# Patient Record
Sex: Female | Born: 1950 | Race: White | Hispanic: No | Marital: Married | Smoking: Never smoker
Health system: Southern US, Community
[De-identification: ages and names within clinical notes are randomized; demographics above are authoritative.]

## PROBLEM LIST (undated history)

## (undated) DIAGNOSIS — C50919 Malignant neoplasm of unspecified site of unspecified female breast: Secondary | ICD-10-CM

## (undated) DIAGNOSIS — C801 Malignant (primary) neoplasm, unspecified: Secondary | ICD-10-CM

## (undated) DIAGNOSIS — IMO0002 Reserved for concepts with insufficient information to code with codable children: Secondary | ICD-10-CM

## (undated) DIAGNOSIS — R748 Abnormal levels of other serum enzymes: Secondary | ICD-10-CM

## (undated) DIAGNOSIS — E785 Hyperlipidemia, unspecified: Secondary | ICD-10-CM

## (undated) DIAGNOSIS — M779 Enthesopathy, unspecified: Secondary | ICD-10-CM

## (undated) DIAGNOSIS — F419 Anxiety disorder, unspecified: Secondary | ICD-10-CM

## (undated) DIAGNOSIS — Z8719 Personal history of other diseases of the digestive system: Secondary | ICD-10-CM

## (undated) DIAGNOSIS — I10 Essential (primary) hypertension: Secondary | ICD-10-CM

## (undated) DIAGNOSIS — I447 Left bundle-branch block, unspecified: Secondary | ICD-10-CM

## (undated) DIAGNOSIS — Z95810 Presence of automatic (implantable) cardiac defibrillator: Secondary | ICD-10-CM

## (undated) DIAGNOSIS — K5790 Diverticulosis of intestine, part unspecified, without perforation or abscess without bleeding: Secondary | ICD-10-CM

## (undated) DIAGNOSIS — R7303 Prediabetes: Secondary | ICD-10-CM

## (undated) DIAGNOSIS — M719 Bursopathy, unspecified: Secondary | ICD-10-CM

## (undated) DIAGNOSIS — D649 Anemia, unspecified: Secondary | ICD-10-CM

## (undated) DIAGNOSIS — I509 Heart failure, unspecified: Secondary | ICD-10-CM

## (undated) DIAGNOSIS — I428 Other cardiomyopathies: Secondary | ICD-10-CM

## (undated) HISTORY — DX: Prediabetes: R73.03

## (undated) HISTORY — DX: Left bundle-branch block, unspecified: I44.7

## (undated) HISTORY — PX: BREAST BIOPSY: SHX20

## (undated) HISTORY — PX: TONSILLECTOMY AND ADENOIDECTOMY: SUR1326

## (undated) HISTORY — PX: CHOLECYSTECTOMY: SHX55

## (undated) HISTORY — PX: OTHER SURGICAL HISTORY: SHX169

## (undated) HISTORY — DX: Enthesopathy, unspecified: M77.9

## (undated) HISTORY — DX: Other cardiomyopathies: I42.8

## (undated) HISTORY — DX: Presence of automatic (implantable) cardiac defibrillator: Z95.810

## (undated) HISTORY — DX: Hyperlipidemia, unspecified: E78.5

## (undated) HISTORY — PX: RHINOPLASTY: SUR1284

## (undated) HISTORY — PX: TUBAL LIGATION: SHX77

## (undated) HISTORY — PX: REDUCTION MAMMAPLASTY: SUR839

## (undated) HISTORY — PX: DILATION AND CURETTAGE OF UTERUS: SHX78

## (undated) HISTORY — PX: COLONOSCOPY: SHX174

## (undated) HISTORY — PX: CARDIAC CATHETERIZATION: SHX172

## (undated) HISTORY — DX: Anemia, unspecified: D64.9

## (undated) HISTORY — PX: ANKLE FRACTURE SURGERY: SHX122

## (undated) HISTORY — DX: Diverticulosis of intestine, part unspecified, without perforation or abscess without bleeding: K57.90

## (undated) HISTORY — DX: Malignant (primary) neoplasm, unspecified: C80.1

## (undated) HISTORY — PX: MASTECTOMY: SHX3

## (undated) HISTORY — DX: Essential (primary) hypertension: I10

## (undated) HISTORY — DX: Bursopathy, unspecified: M71.9

## (undated) HISTORY — DX: Malignant neoplasm of unspecified site of unspecified female breast: C50.919

---

## 2004-06-23 ENCOUNTER — Other Ambulatory Visit: Admission: RE | Admit: 2004-06-23 | Discharge: 2004-06-23 | Payer: Self-pay | Admitting: Obstetrics and Gynecology

## 2005-04-16 DIAGNOSIS — R748 Abnormal levels of other serum enzymes: Secondary | ICD-10-CM

## 2005-04-16 HISTORY — DX: Abnormal levels of other serum enzymes: R74.8

## 2013-10-14 ENCOUNTER — Other Ambulatory Visit: Payer: Self-pay | Admitting: Obstetrics and Gynecology

## 2013-10-14 DIAGNOSIS — R928 Other abnormal and inconclusive findings on diagnostic imaging of breast: Secondary | ICD-10-CM

## 2013-10-19 ENCOUNTER — Ambulatory Visit
Admission: RE | Admit: 2013-10-19 | Discharge: 2013-10-19 | Disposition: A | Discharge status: Home / Self Care | Payer: BC Managed Care – PPO | Source: Ambulatory Visit | Attending: Obstetrics and Gynecology | Admitting: Obstetrics and Gynecology

## 2013-10-19 ENCOUNTER — Other Ambulatory Visit: Payer: Self-pay | Admitting: Obstetrics and Gynecology

## 2013-10-19 DIAGNOSIS — R928 Other abnormal and inconclusive findings on diagnostic imaging of breast: Secondary | ICD-10-CM

## 2013-10-26 ENCOUNTER — Other Ambulatory Visit: Payer: Self-pay

## 2013-10-28 ENCOUNTER — Ambulatory Visit
Admission: RE | Admit: 2013-10-28 | Discharge: 2013-10-28 | Disposition: A | Discharge status: Home / Self Care | Payer: BC Managed Care – PPO | Source: Ambulatory Visit | Attending: Obstetrics and Gynecology | Admitting: Obstetrics and Gynecology

## 2013-10-28 ENCOUNTER — Encounter (INDEPENDENT_AMBULATORY_CARE_PROVIDER_SITE_OTHER): Payer: Self-pay

## 2013-10-28 DIAGNOSIS — R928 Other abnormal and inconclusive findings on diagnostic imaging of breast: Secondary | ICD-10-CM

## 2013-10-30 ENCOUNTER — Other Ambulatory Visit: Payer: Self-pay | Admitting: Obstetrics and Gynecology

## 2013-10-30 DIAGNOSIS — C50911 Malignant neoplasm of unspecified site of right female breast: Secondary | ICD-10-CM

## 2013-11-10 ENCOUNTER — Ambulatory Visit
Admission: RE | Admit: 2013-11-10 | Discharge: 2013-11-10 | Disposition: A | Discharge status: Home / Self Care | Payer: BC Managed Care – PPO | Source: Ambulatory Visit | Attending: Obstetrics and Gynecology | Admitting: Obstetrics and Gynecology

## 2013-11-10 DIAGNOSIS — C50911 Malignant neoplasm of unspecified site of right female breast: Secondary | ICD-10-CM

## 2013-11-10 MED ORDER — GADOBENATE DIMEGLUMINE 529 MG/ML IV SOLN
15.0000 mL | Freq: Once | INTRAVENOUS | Status: AC | PRN
  Administered 2013-11-10: 15 mL via INTRAVENOUS

## 2013-11-11 ENCOUNTER — Other Ambulatory Visit: Payer: Self-pay | Admitting: Obstetrics and Gynecology

## 2013-11-11 DIAGNOSIS — R928 Other abnormal and inconclusive findings on diagnostic imaging of breast: Secondary | ICD-10-CM

## 2013-11-12 ENCOUNTER — Encounter (INDEPENDENT_AMBULATORY_CARE_PROVIDER_SITE_OTHER): Payer: Self-pay | Admitting: General Surgery

## 2013-11-12 ENCOUNTER — Ambulatory Visit (INDEPENDENT_AMBULATORY_CARE_PROVIDER_SITE_OTHER): Payer: BC Managed Care – PPO | Admitting: General Surgery

## 2013-11-12 VITALS — BP 160/60 | HR 92 | Temp 98.1°F | Resp 20 | Ht 66.0 in | Wt 190.0 lb

## 2013-11-12 DIAGNOSIS — C50919 Malignant neoplasm of unspecified site of unspecified female breast: Secondary | ICD-10-CM

## 2013-11-12 DIAGNOSIS — C50411 Malignant neoplasm of upper-outer quadrant of right female breast: Secondary | ICD-10-CM | POA: Insufficient documentation

## 2013-11-12 DIAGNOSIS — C50911 Malignant neoplasm of unspecified site of right female breast: Secondary | ICD-10-CM

## 2013-11-12 NOTE — Progress Notes (Signed)
Chief Complaint: New diagnosis of breast cancer  History:    Loretta Thomas is a 63 y.o. postmenopausal female referred by Dr. 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 She recently presented for a screening mamogram revealing A new area of microcalcifications in the central right breast..  Subsequent imaging included diagnostic mamogram showing A 6 x 6 x 12 mm group of heterogeneous calcifications in the middle third outer right breast with suspicious for DCIS.   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 Subsequent bilateral breast MRI was performed. This has revealed within the central retroareolar region of the right breast a small hematoma post biopsy but also significant additional nonmacerated enhancement in the retroareolar region extending to the nipple with the area of enhancement measuring 4.3 x 2.5 x 3.0 cm. She is seen now in [facility] office for initial treatment planning.  She has experienced No breast symptoms, specifically lump or pain or ongoing nippled discharge..  She does not have a personal history of any previous breast problems.  Findings at that time were the following:  Tumor size: 1.2 cm  Tumor grade: 3  Estrogen Receptor: positive Progesterone Receptor: positive  Her-2 neu: negative  Lymph node status: negative    Past Medical History  Diagnosis Date  . Anemia   . Hyperlipidemia   . Hypertension   . Cancer     right breast    Past Surgical History  Procedure Laterality Date  . Breast biopsy      right breast  . Tonsillectomy and adenoidectomy    . Rhinoplasty    . Ankle fracture surgery    . Cholecystectomy    . Cardiac catheterization      x3  . Colonoscopy      x4  . Tubal ligation    . Dilation and curettage of uterus      Current Outpatient Prescriptions  Medication Sig  Dispense Refill  . ALPRAZolam (XANAX XR) 0.5 MG 24 hr tablet Take 0.5 mg by mouth daily.      Marland Kitchen aspirin 81 MG tablet Take 81 mg by mouth daily.      . carvedilol (COREG) 6.25 MG tablet Take 6.25 mg by mouth 2 (two) times daily with a meal.      . losartan (COZAAR) 50 MG tablet Take 50 mg by mouth daily.      . simvastatin (ZOCOR) 20 MG tablet Take 20 mg by mouth daily.      . vitamin B-12 (CYANOCOBALAMIN) 50 MCG tablet Take 50 mcg by mouth daily.      Marland Kitchen zolpidem (AMBIEN) 10 MG tablet Take 10 mg by mouth at bedtime as needed for sleep.       No current facility-administered medications for this visit.    Family History  Problem Relation Age of Onset  . Cancer Sister     skin and colon  . Cancer Brother     esophageal  . Cancer Maternal Aunt     breast    History   Social History  . Marital Status: Married    Spouse Name: N/A    Number of Children: N/A  . Years of Education: N/A   Social History Main Topics  . Smoking status: Never Smoker   . Smokeless tobacco: None  . Alcohol Use: No  . Drug Use: No  . Sexual Activity:  None   Other Topics Concern  . None   Social History Narrative  . None     Review of Systems Respiratory: negative Cardiovascular: negative, she has previous history of cardiac catheter x3 after episode of hypertension and concern of her mitral prolapse but reportedly all negative Gastrointestinal: positive for constipation, diarrhea and related to IBS Hematologic/lymphatic: negative, gives a history of previous pernicious anemia and low platelet count that has resolved     Objective:  BP 160/60  Pulse 92  Temp(Src) 98.1 F (36.7 C) (Oral)  Resp 20  Ht 5' 6" (1.676 m)  Wt 190 lb (86.183 kg)  BMI 30.68 kg/m2  General: Alert, well-developed Caucasian female, in no distress Skin: Warm and dry without rash or infection. HEENT: No palpable masses or thyromegaly. Sclera nonicteric. Pupils equal round and reactive. Oropharynx clear. Breasts: Some  slight bruising and tenderness lateral right breast. No definite masses or skin changes or nipple retraction. Lymph nodes: No cervical, supraclavicular, or inguinal nodes palpable. Lungs: Breath sounds clear and equal without increased work of breathing Cardiovascular: Regular rate and rhythm without murmur. No JVD or edema. Peripheral pulses intact. Abdomen: Nondistended. Soft and nontender. No masses palpable. No organomegaly. No palpable hernias. Extremities: No edema or joint swelling or deformity. No chronic venous stasis changes. Neurologic: Alert and fully oriented. Gait normal.   Laboratory data:  CBC:  No results found for this basename: WBC, RBC, HGB, HCT, PLT  ]  CMG Labs:  No results found for this basename: GLUF, NA, K, CL, CO2, BUN, CREATININE, CALCIUM, PROT, ALB, BILITOT, BILIDIR, ALKPHOS, AST, ALT     Assessment  63 y.o. female with a new diagnosis of cancer of the the right breast central and outer breast.  Clinical IA, estrogen receptor positive, progesterone receptor positive and Her2/Neu protein/oncogene negative. MRI has shown potentially more locally extensive disease extending up to behind and involving the nipple.I discussed with the patient and family members present today initial surgical treatment options. We discussed options of breast conservation with lumpectomy or total mastectomy and sentinal lymph node biopsy/dissection. Options for reconstruction were discussed. She would like to pursue breast conservation if this is feasible. This would depend on whether the MRI findings are secondary to tumor or not. We will therefore schedule her for a second likely MRI guided biopsy of the more anterior area of enhancement. I discussed with the patient and her family that if this biopsy were positive then I think she would be best served with a total mastectomy. We discussed reconstruction and she would be interested in this. If this biopsy were negative and I believe she  would be a candidate for breast conservation. We discussed the evaluation of the axilla with sentinel lymph node biopsy. We discussed treatment of breast cancer in general with discussion of radiation and hormonal therapy and chemotherapy.  Plan Proceed with MRI guided biopsy of the anterior area of enhancement of the right breast. Further surgical treatment planning will be pending this biopsy. I will call the patient with the results and then we'll get her back in the office for further treatment planning.  Edward Jolly MD, FACS  11/12/2013, 10:43 AM

## 2013-11-16 ENCOUNTER — Ambulatory Visit
Admission: RE | Admit: 2013-11-16 | Discharge: 2013-11-16 | Disposition: A | Discharge status: Home / Self Care | Payer: BC Managed Care – PPO | Source: Ambulatory Visit | Attending: Obstetrics and Gynecology | Admitting: Obstetrics and Gynecology

## 2013-11-16 ENCOUNTER — Other Ambulatory Visit: Payer: Self-pay | Admitting: Diagnostic Radiology

## 2013-11-16 DIAGNOSIS — R928 Other abnormal and inconclusive findings on diagnostic imaging of breast: Secondary | ICD-10-CM

## 2013-11-16 MED ORDER — GADOBENATE DIMEGLUMINE 529 MG/ML IV SOLN
15.0000 mL | Freq: Once | INTRAVENOUS | Status: DC | PRN
Start: 2013-11-16 — End: 2013-11-17

## 2013-11-19 ENCOUNTER — Telehealth (INDEPENDENT_AMBULATORY_CARE_PROVIDER_SITE_OTHER): Payer: Self-pay

## 2013-11-19 NOTE — Telephone Encounter (Signed)
Called and left message for patient to call our office RE:  Appointment for tomorrow 11/20/13 @ 4:30pm to see Dr. Excell Seltzer.

## 2013-11-20 ENCOUNTER — Telehealth (INDEPENDENT_AMBULATORY_CARE_PROVIDER_SITE_OTHER): Payer: Self-pay

## 2013-11-20 ENCOUNTER — Encounter (INDEPENDENT_AMBULATORY_CARE_PROVIDER_SITE_OTHER): Payer: Self-pay | Admitting: General Surgery

## 2013-11-20 ENCOUNTER — Ambulatory Visit (INDEPENDENT_AMBULATORY_CARE_PROVIDER_SITE_OTHER): Payer: BC Managed Care – PPO | Admitting: General Surgery

## 2013-11-20 VITALS — BP 134/78 | HR 81 | Temp 97.0°F | Ht 67.0 in | Wt 187.0 lb

## 2013-11-20 DIAGNOSIS — C50919 Malignant neoplasm of unspecified site of unspecified female breast: Secondary | ICD-10-CM

## 2013-11-20 DIAGNOSIS — C50911 Malignant neoplasm of unspecified site of right female breast: Secondary | ICD-10-CM

## 2013-11-20 NOTE — Telephone Encounter (Signed)
Message copied by Ivor Costa on Fri Nov 20, 2013 10:33 AM ------      Message from: Crisoforo Oxford      Created: Thu Nov 19, 2013 10:24 AM      Contact: [phone]       Pt called you back ,pls call ty TT ------

## 2013-11-20 NOTE — Telephone Encounter (Signed)
Spoke to patient this morning regarding appointment.  Patient states she returned my call yesterday and was given the message about appointment today with Dr. Excell Seltzer.

## 2013-11-20 NOTE — Patient Instructions (Signed)
We will arrange visits with genetic counseling, physical therapy and plastic surgery then plan to proceed with right total mastectomy with sentinel lymph node biopsy and immediate reconstruction as discussed.

## 2013-11-20 NOTE — Progress Notes (Signed)
Chief Complaint: New diagnosis of breast cancer  History:    Loretta Thomas is a 63 y.o. postmenopausal female referred by Dr. 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 She recently presented for a screening mamogram revealing A new area of microcalcifications in the central right breast..  Subsequent imaging included diagnostic mamogram showing A 6 x 6 x 12 mm group of heterogeneous calcifications in the middle third outer right breast with suspicious for DCIS.   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 Subsequent bilateral breast MRI was performed. This has revealed within the central retroareolar region of the right breast a small hematoma post biopsy but also significant additional nonmacerated enhancement in the retroareolar region extending to the nipple with the area of enhancement measuring 4.3 x 2.5 x 3.0 cm. She is now seen back in the office following MR guided biopsy of the more extensive retroareolar enhancement seen on MRI.  This has unfortunately revealed DCIS. The patient is brought back to the office today to discuss the diagnosis and treatment planning. Findings at that time were the following:  Tumor size: 4.5 cm  Tumor grade: 3  Estrogen Receptor: positive Progesterone Receptor: positive  Her-2 neu: negative  Lymph node status: negative    Past Medical History  Diagnosis Date  . Anemia   . Hyperlipidemia   . Hypertension   . Cancer     right breast    Past Surgical History  Procedure Laterality Date  . Breast biopsy      right breast  . Tonsillectomy and adenoidectomy    . Rhinoplasty    . Ankle fracture surgery    . Cholecystectomy    . Cardiac catheterization      x3  . Colonoscopy      x4  . Tubal ligation    . Dilation and curettage of uterus      Current Outpatient  Prescriptions  Medication Sig Dispense Refill  . ALPRAZolam (XANAX XR) 0.5 MG 24 hr tablet Take 0.5 mg by mouth daily.      Marland Kitchen aspirin 81 MG tablet Take 81 mg by mouth daily.      . carvedilol (COREG) 6.25 MG tablet Take 6.25 mg by mouth 2 (two) times daily with a meal.      . losartan (COZAAR) 50 MG tablet Take 50 mg by mouth daily.      . simvastatin (ZOCOR) 20 MG tablet Take 20 mg by mouth daily.      . vitamin B-12 (CYANOCOBALAMIN) 50 MCG tablet Take 50 mcg by mouth daily.      Marland Kitchen zolpidem (AMBIEN) 10 MG tablet Take 10 mg by mouth at bedtime as needed for sleep.       No current facility-administered medications for this visit.    Family History  Problem Relation Age of Onset  . Cancer Sister     skin and colon  . Cancer Brother     esophageal  . Cancer Maternal Aunt     breast    History   Social History  . Marital Status: Married    Spouse Name: N/A    Number of Children: N/A  . Years of Education: N/A   Social History Main Topics  . Smoking status: Never Smoker   . Smokeless tobacco: None  . Alcohol Use: No  . Drug Use: No  .  Sexual Activity: None   Other Topics Concern  . None   Social History Narrative  . None     Review of Systems Respiratory: negative Cardiovascular: negative, she has previous history of cardiac catheter x3 after episode of hypertension and concern of her mitral prolapse but reportedly all negative Gastrointestinal: positive for constipation, diarrhea and related to IBS Hematologic/lymphatic: negative, gives a history of previous pernicious anemia and low platelet count that has resolved     Objective:  BP 134/78  Pulse 81  Temp(Src) 97 F (36.1 C)  Ht '5\' 7"'  (1.702 m)  Wt 187 lb (84.823 kg)  BMI 29.28 kg/m2  General: Alert, well-developed Caucasian female, in no distress Skin: Warm and dry without rash or infection. HEENT: No palpable masses or thyromegaly. Sclera nonicteric. Pupils equal round and reactive. Oropharynx  clear. Breasts: Some slight bruising and tenderness lateral right breast. No definite masses or skin changes or nipple retraction. Lymph nodes: No cervical, supraclavicular, or inguinal nodes palpable. Lungs: Breath sounds clear and equal without increased work of breathing Cardiovascular: Regular rate and rhythm without murmur. No JVD or edema. Peripheral pulses intact. Abdomen: Nondistended. Soft and nontender. No masses palpable. No organomegaly. No palpable hernias. Extremities: No edema or joint swelling or deformity. No chronic venous stasis changes. Neurologic: Alert and fully oriented. Gait normal.   Laboratory data:  CBC:  No results found for this basename: WBC,  RBC,  HGB,  HCT,  PLT  ]  CMG Labs:  No results found for this basename: GLUF,  NA,  K,  CL,  CO2,  BUN,  CREATININE,  CALCIUM,  PROT,  ALB,  BILITOT,  BILIDIR,  ALKPHOS,  AST,  ALT     Assessment  63 y.o. female with a new diagnosis of cancer of the the right breast central and outer breast.  Clinical IA, estrogen receptor positive, progesterone receptor positive and Her2/Neu protein/oncogene negative. MRI has shown  more locally extensive disease extending up to behind and involving the nipple Measuring up to 4.5 cm in greatest extent..As we had previously discussed at her first visit this finding of locally extensive disease and involvement of the nipple I believe she would be best served with total mastectomy. This was discussed with the patient and her family today in great detail. She was upset with this news but accepting. I believe she would be a candidate for immediate reconstruction and she is interested in this. We will make a referral to plastic surgery for her. The patient also is asking about genetic counseling. She does have some history of breast cancer and ovarian cancer in her mother and aunt and I'm going to refer her for genetic counseling. We will have her see physical therapy regarding lymphedema  prevention. I am recommending right total mastectomy with axillary sentinel lymph node biopsy and possible axillary dissection with likely immediate reconstruction. We discussed the indications for the procedure and risks of anesthetic complications, bleeding, infection, wound healing problems and lymphedema. All her questions were answered. She is in agreement with the plan. Plan Right total mastectomy with axillary sentinel lymph node biopsy and possible axillary dissection. She is interested in immediate reconstruction and she is being referred to plastic surgery. She will see genetic counselor and physical therapy preop.  Edward Jolly MD, FACS  11/20/2013, 6:34 PM

## 2013-11-20 NOTE — Telephone Encounter (Signed)
Spoke to patient this morning to confirm she's aware of office appointment today with Dr. Excell Seltzer

## 2013-11-23 ENCOUNTER — Telehealth (INDEPENDENT_AMBULATORY_CARE_PROVIDER_SITE_OTHER): Payer: Self-pay | Admitting: *Deleted

## 2013-11-23 NOTE — Telephone Encounter (Signed)
Pt returned my call and she was made aware of her appt with Dr. Harlow Mares.  She said she may have to cancel, so I provided her with the phone number to Dr. Harlow Mares in case she needed to call.  Anderson Malta

## 2013-11-23 NOTE — Telephone Encounter (Signed)
LMOM for pt to return my call regarding a referral for her to see the plastic surgeon, Dr. Harlow Mares.  Please advise pt it has been scheduled for this Friday, 11-27-13 arriving at 11:15.  Please let pt know their office is in the same building as [place], they are on the 2nd floor [address].  Thanks!  Anderson Malta

## 2013-11-23 NOTE — Addendum Note (Signed)
Addended by: Ivor Costa on: 11/23/2013 01:33 PM   Modules accepted: Orders

## 2013-11-25 ENCOUNTER — Encounter (INDEPENDENT_AMBULATORY_CARE_PROVIDER_SITE_OTHER): Payer: BC Managed Care – PPO | Admitting: General Surgery

## 2013-12-04 ENCOUNTER — Telehealth (INDEPENDENT_AMBULATORY_CARE_PROVIDER_SITE_OTHER): Payer: Self-pay

## 2013-12-04 ENCOUNTER — Telehealth: Payer: Self-pay | Admitting: *Deleted

## 2013-12-04 ENCOUNTER — Ambulatory Visit: Payer: BC Managed Care – PPO | Admitting: Physical Therapy

## 2013-12-04 NOTE — Telephone Encounter (Signed)
Left message for pt to return my call so I can schedule a genetic appt.  

## 2013-12-04 NOTE — Telephone Encounter (Signed)
Imaging reports faxed to Dr. Harlow Mares office @ [phone]

## 2013-12-07 ENCOUNTER — Ambulatory Visit: Payer: BC Managed Care – PPO | Attending: Physical Therapy | Admitting: Physical Therapy

## 2013-12-07 DIAGNOSIS — C50919 Malignant neoplasm of unspecified site of unspecified female breast: Secondary | ICD-10-CM | POA: Diagnosis not present

## 2013-12-07 DIAGNOSIS — R293 Abnormal posture: Secondary | ICD-10-CM | POA: Insufficient documentation

## 2013-12-07 DIAGNOSIS — Z8781 Personal history of (healed) traumatic fracture: Secondary | ICD-10-CM | POA: Insufficient documentation

## 2013-12-07 DIAGNOSIS — I10 Essential (primary) hypertension: Secondary | ICD-10-CM | POA: Insufficient documentation

## 2013-12-07 DIAGNOSIS — IMO0001 Reserved for inherently not codable concepts without codable children: Secondary | ICD-10-CM | POA: Insufficient documentation

## 2013-12-14 ENCOUNTER — Telehealth: Payer: Self-pay | Admitting: *Deleted

## 2013-12-14 NOTE — Telephone Encounter (Signed)
Pt returned my call and she was confused to why I was calling.  Explained to her of the referrals sent over for Med Onc and genetics.  She went ahead and agreed to scheduled the med onc appt, will speak with her later about the genetic appt.  Faxed new pt letter, welcoming packet & intake form to pts work.  Emailed [employer] [employer] [place] at [employer] to make them aware.

## 2013-12-14 NOTE — Telephone Encounter (Signed)
Spoke with pt again and confirmed 12/16/13 genetic appt w/ her. Emailed Barnetta Chapel Fine to make her aware of the appt add on.  Emailed Alyse Low and [place] at [employer] to make them aware.

## 2013-12-14 NOTE — Telephone Encounter (Signed)
Pt left me a message, called her back and left a message for return my call so I can schedule a med onc and genetic appt w/ her.

## 2013-12-16 ENCOUNTER — Encounter: Payer: Self-pay | Admitting: Hematology and Oncology

## 2013-12-16 ENCOUNTER — Ambulatory Visit: Payer: BC Managed Care – PPO | Admitting: Genetic Counselor

## 2013-12-16 ENCOUNTER — Ambulatory Visit: Payer: BC Managed Care – PPO

## 2013-12-16 ENCOUNTER — Other Ambulatory Visit (HOSPITAL_BASED_OUTPATIENT_CLINIC_OR_DEPARTMENT_OTHER): Payer: BC Managed Care – PPO

## 2013-12-16 ENCOUNTER — Ambulatory Visit (HOSPITAL_BASED_OUTPATIENT_CLINIC_OR_DEPARTMENT_OTHER): Payer: BC Managed Care – PPO | Admitting: Hematology and Oncology

## 2013-12-16 VITALS — BP 147/84 | HR 79 | Temp 97.8°F | Resp 22 | Ht 67.0 in | Wt 189.0 lb

## 2013-12-16 DIAGNOSIS — C50919 Malignant neoplasm of unspecified site of unspecified female breast: Secondary | ICD-10-CM

## 2013-12-16 DIAGNOSIS — Z800 Family history of malignant neoplasm of digestive organs: Secondary | ICD-10-CM | POA: Insufficient documentation

## 2013-12-16 DIAGNOSIS — Z803 Family history of malignant neoplasm of breast: Secondary | ICD-10-CM | POA: Insufficient documentation

## 2013-12-16 DIAGNOSIS — C50911 Malignant neoplasm of unspecified site of right female breast: Secondary | ICD-10-CM

## 2013-12-16 DIAGNOSIS — Z170 Estrogen receptor positive status [ER+]: Secondary | ICD-10-CM

## 2013-12-16 DIAGNOSIS — N390 Urinary tract infection, site not specified: Secondary | ICD-10-CM

## 2013-12-16 DIAGNOSIS — Z8041 Family history of malignant neoplasm of ovary: Secondary | ICD-10-CM | POA: Insufficient documentation

## 2013-12-16 LAB — URINALYSIS, MICROSCOPIC - CHCC
Bilirubin (Urine): NEGATIVE
Blood: NEGATIVE
Glucose: NEGATIVE mg/dL
Ketones: NEGATIVE mg/dL
Nitrite: NEGATIVE
Protein: NEGATIVE mg/dL
RBC / HPF: NEGATIVE (ref 0–2)
Specific Gravity, Urine: 1.01 (ref 1.003–1.035)
Urobilinogen, UR: 0.2 mg/dL (ref 0.2–1)
pH: 7.5 (ref 4.6–8.0)

## 2013-12-16 NOTE — Addendum Note (Signed)
Addended by: Prentiss Bells on: 12/16/2013 01:45 PM   Modules accepted: Orders

## 2013-12-16 NOTE — Progress Notes (Signed)
[facility] CONSULT NOTE  Patient Care Team: Olga Millers, MD as PCP - General (Obstetrics and Gynecology)  CHIEF COMPLAINTS/PURPOSE OF CONSULTATION:  Newly diagnosed breast cancer  HISTORY OF PRESENTING ILLNESS:  Loretta Thomas 63 y.o. female is here because of recent diagnosis of right breast cancer. She had a screening mammogram on 10/19/2013 that revealed in the outer quadrant of the right breast 1.2 cm group of heterogeneous calcifications this was biopsied on 10/28/2013 revealed DCIS with comedonecrosis but an additional focus of invasive ductal carcinoma was also seen this was ER/PR positive HER-2 negative. She underwent bilateral MRI of the breasts that revealed apart from the hematoma there was a non-masslike enhancement measuring 4.3 cm. Extending to the retroareolar region. The retroareolar region biopsy was done 11/16/2013 which came back as DCIS. She saw Dr. Excell Seltzer who recommended mastectomy and she is here today to discuss the treatment plan.  I reviewed her records extensively and collaborated the history with the patient.  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  She was never exposed to fertility medications or hormone  replacement therapy.  She has  family history of Breast/GYN/GI cancer  MEDICAL HISTORY:  Past Medical History  Diagnosis Date  . Anemia   . Hyperlipidemia   . Hypertension   . Cancer     right breast    SURGICAL HISTORY: Past Surgical History  Procedure Laterality Date  . Breast biopsy      right breast  . Tonsillectomy and adenoidectomy    . Rhinoplasty    . Ankle fracture surgery    . Cholecystectomy    . Cardiac catheterization      x3  . Colonoscopy      x4  . Tubal ligation    . Dilation and curettage of uterus      SOCIAL HISTORY: History   Social History  . Marital Status: Married    Spouse Name: N/A    Number of Children: N/A  . Years of Education: N/A   Occupational History  . Not on file.   Social History Main Topics  . Smoking status: Never Smoker   . Smokeless tobacco: Not on file  . Alcohol Use: No  . Drug Use: No  . Sexual Activity: Not on file   Other Topics Concern  . Not on file   Social History Narrative  . No narrative on file    FAMILY HISTORY: Family History  Problem Relation Age of Onset  . Cancer Sister     skin and colon  . Cancer Brother     esophageal  . Cancer Maternal Aunt     breast    ALLERGIES:  is allergic to codeine; demerol; hydrocodone; and lortab.  MEDICATIONS:  Current Outpatient Prescriptions  Medication Sig Dispense Refill  . ALPRAZolam (XANAX XR) 0.5 MG 24 hr tablet Take 0.5 mg by mouth daily.      Marland Kitchen aspirin 81 MG tablet Take 81 mg by mouth daily.      . carvedilol (COREG) 6.25 MG tablet Take 6.25 mg by mouth 2 (two) times daily with a meal.      . losartan (COZAAR) 50 MG tablet Take 50 mg by mouth daily.      . simvastatin (ZOCOR) 20 MG tablet Take 20 mg by mouth daily.      . vitamin B-12 (CYANOCOBALAMIN) 50 MCG tablet Take 50 mcg by mouth daily.      Marland Kitchen zolpidem (AMBIEN) 10 MG tablet Take 10 mg by mouth at bedtime as needed for sleep.       No current facility-administered medications for this  visit.    REVIEW OF SYSTEMS:   Constitutional: Denies fevers, chills or abnormal night sweats Eyes: Denies blurriness of vision, double vision or watery eyes Ears, nose, mouth, throat, and face: Denies mucositis or sore throat Respiratory: Denies cough, dyspnea or wheezes Cardiovascular: Denies palpitation, chest discomfort or lower extremity swelling Gastrointestinal:  Denies nausea, heartburn or change in bowel habits Skin: Denies abnormal skin rashes Lymphatics: Denies new lymphadenopathy or easy bruising Neurological:Denies numbness, tingling or new weaknesses Behavioral/Psych: Mood is stable, no new changes  Breast:  Denies any palpable lumps or discharge All other systems were reviewed with the patient and are negative.  PHYSICAL EXAMINATION: ECOG PERFORMANCE STATUS: 0 - Asymptomatic  There were no vitals filed for this visit. There were no vitals filed for this visit.  GENERAL:alert, no distress and comfortable SKIN: skin color, texture, turgor are normal, no rashes or significant lesions EYES: normal, conjunctiva are pink and non-injected, sclera clear OROPHARYNX:no exudate, no erythema and lips, buccal mucosa, and tongue normal  NECK: supple, thyroid normal size, non-tender, without nodularity LYMPH:  no palpable lymphadenopathy in the cervical, axillary or inguinal LUNGS: clear to auscultation and percussion with normal breathing effort HEART: regular rate & rhythm and no murmurs and no lower extremity edema ABDOMEN:abdomen soft, non-tender and normal bowel sounds Musculoskeletal:no cyanosis of digits and no clubbing  PSYCH: alert & oriented x 3 with fluent speech NEURO: no focal motor/sensory deficits BREAST: No palpable nodules in breast. No palpable axillary or supraclavicular lymphadenopathy  LABORATORY DATA:  I have reviewed the data as listed No results found for this basename: WBC,  HGB,  HCT,  MCV,  PLT   No results found for this basename: NA,  K,  CL,  CO2     RADIOGRAPHIC STUDIES: I have personally reviewed the radiological reports and agreed with the findings in the report.  ASSESSMENT AND PLAN:  Cancer of right breast 1. 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 MRI of the breasts was reviewed with the patient which showed diffuse extension up to the area large area measuring 4.3 cm. This was biopsied 11/16/2013 came back as DCIS. Patient is being evaluated for mastectomy and sentinel lymph node study. Clinical stage based on mammogram is T1c N0 M0 stage IA  2. Discussed with her the details of pathology including the clinical staging the type of breast cancer, the significance of ER PR and HER-2/neu receptors and the implications for treatment decisions. After reviewing the pathology in detail, we proceeded to discuss the different treatment options between surgery, radiation, chemotherapy,  antiestrogen therapies. After surgery I would like to see her and consider sending her tissue for Oncotype DX testing to determine if she would benefit from adjuvant systemic chemotherapy.    return to clinic after surgery  All questions were answered. The patient knows to call the clinic with any problems, questions or concerns. I spent 55 minutes counseling the patient face to face. The total time spent in the appointment was 60 minutes and more than 50% was on counseling.     Rulon Eisenmenger, MD 12/16/2013 11:34 AM

## 2013-12-16 NOTE — Progress Notes (Signed)
Checked in patient with no financial issues prior to seeing the dr. She said she probably is close to meeting her deductible. I gave her Lenise's card and if any issues to call her. She has appt card and has her [facility] packet already.

## 2013-12-16 NOTE — Progress Notes (Signed)
HISTORY OF PRESENT ILLNESS: Dr. Ouida Sills requested a cancer genetics consultation for Loretta Thomas, a 63 y.o. female, due to a personal and family history of cancer.  Loretta Thomas presents to clinic today to discuss the possibility of a hereditary predisposition to cancer, genetic testing, and to further clarify her future cancer risks, as well as potential cancer risk for family members. Loretta Thomas was diagnosed with right breast cancer at the age of 72. She is currently planning her treatment with her oncologist and surgeon and would like to use genetic test results to help make surgical decisions.    Past Medical History  Diagnosis Date   Anemia    Hyperlipidemia    Hypertension    Cancer     right breast   Diabetes mellitus without complication     pre-diabetes    Past Surgical History  Procedure Laterality Date   Breast biopsy      right breast   Tonsillectomy and adenoidectomy     Rhinoplasty     Ankle fracture surgery     Cholecystectomy     Cardiac catheterization      x3   Colonoscopy      x4   Tubal ligation     Dilation and curettage of uterus      History   Social History   Marital Status: Married    Spouse Name: N/A    Number of Children: N/A   Years of Education: N/A   Social History Main Topics   Smoking status: Never Smoker    Smokeless tobacco: Never Used   Alcohol Use: No   Drug Use: No   Sexual Activity: Not on file   Other Topics Concern   Not on file   Social History Narrative   No narrative on file     FAMILY HISTORY:  During the visit, a 4-generation pedigree was obtained. Significant diagnoses include the following:  Family History  Problem Relation Age of Onset   Cancer Sister     skin and colon x2   Cancer Brother     esophageal   Cancer Maternal Aunt 42    breast   Cancer Maternal Grandmother 59    ovarian   Cancer Cousin 27    mat 1st cousin with breast    Loretta Thomas's ancestry is of  Caucasian descent. There is no known Jewish ancestry or consanguinity.  GENETIC COUNSELING ASSESSMENT: Loretta Thomas is a 63 y.o. female with a personal and family history of cancer suggestive of a hereditary predisposition to cancer. We, therefore, discussed and recommended the following at today's visit.   DISCUSSION: We reviewed the characteristics, features and inheritance patterns of hereditary cancer syndromes. We also discussed genetic testing, including the appropriate family members to test, the process of testing, insurance coverage and turn-around-time for results. We discussed the implications of a negative, positive and/or variant of uncertain significant result. We recommended Loretta Thomas pursue genetic testing for the BRCA1 and BRCA2 genes to help make surgical decisions. If this is negative, we recommended testing for the OvaNext gene panel due to the family history of colon and ovarian cancer.   PLAN: Based on our above recommendation, Loretta Thomas wished to pursue genetic testing and the blood sample was drawn and will be sent to [facility] for analysis. Results for BRCA1 and BRCA2 should be available within approximately 2 weeks time, at which point they will be disclosed by telephone to Loretta Thomas, as will any additional recommendations warranted  by these results. If reflex testing for a gene panel is pursued, these results take an additional 2 weeks. We also encouraged Loretta Thomas to remain in contact with cancer genetics annually so that we can continuously update the family history and inform her of any changes in cancer genetics and testing that may be of benefit for this family. Ms.  Thomas's questions were answered to her satisfaction today. Our contact information was provided should additional questions or concerns arise.   Thank you for the referral and allowing [place] to share in the care of your patient.   The patient was seen for a total of 40 minutes in  face-to-face genetic counseling.  This patient was discussed with [place] who agrees with the above.    _______________________________________________________________________ For Office Staff:  Number of people involved in session: 4 Was an Intern/ student involved with case: not applicable

## 2013-12-16 NOTE — Progress Notes (Signed)
Ofc note created by MD in appt - copy to pt.  Original to scan.

## 2013-12-16 NOTE — Assessment & Plan Note (Signed)
1. 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 MRI of the breasts was reviewed with the patient which showed diffuse extension up to the area large area measuring 4.3 cm. This was biopsied 11/16/2013 came back as DCIS. Patient is being evaluated for mastectomy and sentinel lymph node study. Clinical stage based on mammogram is T1c N0 M0 stage IA  2. Discussed with her the details of pathology including the clinical staging the type of breast cancer, the significance of ER PR and HER-2/neu receptors and the implications for treatment decisions. After reviewing the pathology in detail, we proceeded to discuss the different treatment options between surgery, radiation, chemotherapy, antiestrogen therapies. After surgery I would like to see her and consider sending her tissue for Oncotype DX testing to determine if she would benefit from adjuvant systemic chemotherapy.

## 2013-12-18 LAB — URINE CULTURE

## 2013-12-23 ENCOUNTER — Other Ambulatory Visit: Payer: Self-pay

## 2013-12-24 ENCOUNTER — Telehealth: Payer: Self-pay

## 2013-12-24 ENCOUNTER — Telehealth: Payer: Self-pay | Admitting: Hematology and Oncology

## 2013-12-24 NOTE — Telephone Encounter (Signed)
m, °

## 2013-12-24 NOTE — Telephone Encounter (Signed)
Returned call to pt message.  Confirmed pt results from urine tests were negative.  Pt reports she is still having symptoms - urgency, frequency, pain, odor.  Let pt know I would discuss with MD and get back with her.  Routed to Dr. Lindi Adie  Confirmed pt appt date.  Patient expressing distress: diagnosis, surgery, son with mental illness.  Encourage patient to explore resources available at clinic.  Pt stated she would go through material when she got home.

## 2013-12-24 NOTE — Telephone Encounter (Signed)
LMOVM - returning pt call re: urine tests.  Reviewed with Dr. Lindi Adie - no UTI.  Pt to return call to clinic if she has any questions.

## 2013-12-25 ENCOUNTER — Other Ambulatory Visit: Payer: Self-pay

## 2013-12-25 DIAGNOSIS — C50911 Malignant neoplasm of unspecified site of right female breast: Secondary | ICD-10-CM

## 2013-12-25 MED ORDER — CIPROFLOXACIN HCL 500 MG PO TABS: 500.0000 mg | ORAL_TABLET | Freq: Two times a day (BID) | ORAL | Status: DC

## 2013-12-25 NOTE — Progress Notes (Signed)
Cipro ordered by Dr. Lindi Adie.  Pt notified and voiced understanding.   Prescription called in to [facility]

## 2013-12-29 ENCOUNTER — Encounter: Payer: Self-pay | Admitting: Genetic Counselor

## 2013-12-29 DIAGNOSIS — Z803 Family history of malignant neoplasm of breast: Secondary | ICD-10-CM

## 2013-12-29 DIAGNOSIS — Z800 Family history of malignant neoplasm of digestive organs: Secondary | ICD-10-CM

## 2013-12-29 DIAGNOSIS — C50919 Malignant neoplasm of unspecified site of unspecified female breast: Secondary | ICD-10-CM

## 2013-12-29 DIAGNOSIS — Z8041 Family history of malignant neoplasm of ovary: Secondary | ICD-10-CM

## 2013-12-29 NOTE — Progress Notes (Signed)
Loretta Thomas recently had cancer genetic counseling at [name] on December 16, 2013. At that time, it was recommended she pursue genetic testing. Her BRCA1 and BRCA2 gene test, which was performed at [name], has returned and is negative for mutations. These results were disclosed to her today. Per her request, reflex testing for the OvaNext gene panel at [name] was initiated. Results for the gene panel should be available in 2-3 more weeks and we will contact her to discuss these results and recommendations warranted by these results.

## 2014-01-01 ENCOUNTER — Telehealth: Payer: Self-pay

## 2014-01-01 NOTE — Telephone Encounter (Signed)
Copy to Dr. Lindi Adie

## 2014-01-01 NOTE — Telephone Encounter (Signed)
Report rcvd by fax from [facility] 12/28/13.  Sent to scan.

## 2014-01-18 ENCOUNTER — Encounter (HOSPITAL_COMMUNITY): Payer: Self-pay | Admitting: Pharmacy Technician

## 2014-01-19 NOTE — Pre-Procedure Instructions (Signed)
Loretta Thomas  01/19/2014   Your procedure is scheduled on: Thursday, October 15.  Report to [facility] Admitting at 9:30AM.  Call this number if you have problems the morning of surgery: [phone]   Remember:   Do not eat food or drink liquids after midnight Wednesday, October 14.   Take these medicines the morning of surgery with A SIP OF WATER: carvedilol (COREG).              Stop taking Aspirin, Coumadin, Plavix, Effient and Herbal medications.  Do not take any NSAIDs ie: Ibuprofen,  Advil,Naproxen or any medication containing Aspirin.     Do not wear jewelry, make-up or nail polish.  Do not wear lotions, powders, or perfumes.  Do not shave 48 hours prior to surgery.  Do not bring valuables to the hospital.              [facility] is not responsible   for any belongings or valuables.               Contacts, dentures or bridgework may not be worn into surgery.  Leave suitcase in the car. After surgery it may be brought to your room.  For patients admitted to the hospital, discharge time is determined by your treatment team.               Patients discharged the day of surgery will not be allowed to drive home.  Name and phone number of your driver: -   Special Instructions: Review   - Preparing For Surgery.   Please read over the following fact sheets that you were given: Pain Booklet, Coughing and Deep Breathing and Surgical Site Infection Prevention

## 2014-01-20 ENCOUNTER — Encounter (HOSPITAL_COMMUNITY): Payer: Self-pay

## 2014-01-20 ENCOUNTER — Encounter (HOSPITAL_COMMUNITY)
Admission: RE | Admit: 2014-01-20 | Discharge: 2014-01-20 | Disposition: A | Discharge status: Home / Self Care | Payer: BC Managed Care – PPO | Source: Ambulatory Visit | Attending: General Surgery | Admitting: General Surgery

## 2014-01-20 ENCOUNTER — Ambulatory Visit (HOSPITAL_COMMUNITY)
Admission: RE | Admit: 2014-01-20 | Discharge: 2014-01-20 | Disposition: A | Discharge status: Home / Self Care | Payer: BC Managed Care – PPO | Source: Ambulatory Visit | Attending: Anesthesiology | Admitting: Anesthesiology

## 2014-01-20 ENCOUNTER — Other Ambulatory Visit: Payer: Self-pay | Admitting: Plastic Surgery

## 2014-01-20 DIAGNOSIS — R42 Dizziness and giddiness: Secondary | ICD-10-CM | POA: Insufficient documentation

## 2014-01-20 DIAGNOSIS — J40 Bronchitis, not specified as acute or chronic: Secondary | ICD-10-CM | POA: Diagnosis not present

## 2014-01-20 DIAGNOSIS — R079 Chest pain, unspecified: Secondary | ICD-10-CM | POA: Diagnosis not present

## 2014-01-20 DIAGNOSIS — Z01818 Encounter for other preprocedural examination: Secondary | ICD-10-CM

## 2014-01-20 DIAGNOSIS — J189 Pneumonia, unspecified organism: Secondary | ICD-10-CM | POA: Diagnosis present

## 2014-01-20 HISTORY — DX: Reserved for concepts with insufficient information to code with codable children: IMO0002

## 2014-01-20 HISTORY — DX: Anxiety disorder, unspecified: F41.9

## 2014-01-20 HISTORY — DX: Personal history of other diseases of the digestive system: Z87.19

## 2014-01-20 HISTORY — DX: Abnormal levels of other serum enzymes: R74.8

## 2014-01-20 LAB — CBC
HEMATOCRIT: 38.1 % (ref 36.0–46.0)
Hemoglobin: 12.7 g/dL (ref 12.0–15.0)
MCH: 30.5 pg (ref 26.0–34.0)
MCHC: 33.3 g/dL (ref 30.0–36.0)
MCV: 91.6 fL (ref 78.0–100.0)
Platelets: 178 10*3/uL (ref 150–400)
RBC: 4.16 MIL/uL (ref 3.87–5.11)
RDW: 13.2 % (ref 11.5–15.5)
WBC: 5.7 10*3/uL (ref 4.0–10.5)

## 2014-01-20 LAB — COMPREHENSIVE METABOLIC PANEL
ALT: 10 U/L (ref 0–35)
ANION GAP: 13 (ref 5–15)
AST: 22 U/L (ref 0–37)
Albumin: 4.1 g/dL (ref 3.5–5.2)
Alkaline Phosphatase: 58 U/L (ref 39–117)
BILIRUBIN TOTAL: 0.4 mg/dL (ref 0.3–1.2)
BUN: 9 mg/dL (ref 6–23)
CHLORIDE: 106 meq/L (ref 96–112)
CO2: 26 mEq/L (ref 19–32)
CREATININE: 0.7 mg/dL (ref 0.50–1.10)
Calcium: 9.3 mg/dL (ref 8.4–10.5)
GFR calc Af Amer: >90 mL/min (ref >90)
GFR calc non Af Amer: >90 mL/min (ref >90)
Glucose, Bld: 86 mg/dL (ref 70–99)
Potassium: 4 mEq/L (ref 3.7–5.3)
Sodium: 145 mEq/L (ref 137–147)
Total Protein: 7.6 g/dL (ref 6.0–8.3)

## 2014-01-20 NOTE — Progress Notes (Signed)
01/20/14 0951  OBSTRUCTIVE SLEEP APNEA  Have you ever been diagnosed with sleep apnea through a sleep study? No (negative sleep study in 2014)  Do you snore loudly (loud enough to be heard through closed doors)?  1  Do you often feel tired, fatigued, or sleepy during the daytime? 0  Has anyone observed you stop breathing during your sleep? 0  Do you have, or are you being treated for high blood pressure? 1  BMI more than 35 kg/m2? 0  Age over 62 years old? 1  Neck circumference greater than 40 cm/16 inches? 0  Gender: 0  Obstructive Sleep Apnea Score 3

## 2014-01-21 ENCOUNTER — Encounter (HOSPITAL_COMMUNITY): Payer: Self-pay

## 2014-01-21 NOTE — Progress Notes (Signed)
error 

## 2014-01-21 NOTE — Progress Notes (Signed)
Anesthesia Chart Review:  Patient is a 63 year old female scheduled for right total mastectomy with right axillary SN biopsy and possible axillary resection on 01/28/14 by Dr. Excell Seltzer. She is also posted for right breast reconstruction with placement of tissue expander/possible Flex HD by Dr. Harlow Mares (orders pending).  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 PCP was reported as Dr. Neita Garnet in [place].    EKG on 01/20/14 showed: NSR, LAD, left BBB. LAD is probably new (lead II with low QRS voltage in previous EKG), but left BBB is stable when compared to 03/18/12 EKG from [facility] [place]. Oncologist is Dr. Lindi Adie.  Cardiac cath on 03/18/12 ([place]) done due to chest discomfort with findings of new left BBB showed: NL LM, mild 10-20% proximal LAD irregularities, < 20% ostial LCX narrowing, mid PLA with mild 20% irregularities. Estimated EF 60%. No wall motion abnormalities. Cardiologist Dr. Laymond Purser felt her left BBB was likely due to hypertensive heart disease.  Aggressive risk factor modification recommended.    CXR on 01/20/14 showed: No active cardiopulmonary disease.  Preoperative labs noted. CMET and CBC WNL.  Patient with left BBB since at least 03/2012 with only minimal CAD at that time.  If no acute changes or new CV symptoms then I anticipate that she can proceed as planned.  [facility]/Anesthesiology Phone [phone] 01/21/2014 1:17 PM

## 2014-01-26 ENCOUNTER — Encounter: Payer: Self-pay | Admitting: Genetic Counselor

## 2014-01-26 ENCOUNTER — Telehealth (INDEPENDENT_AMBULATORY_CARE_PROVIDER_SITE_OTHER): Payer: Self-pay

## 2014-01-26 DIAGNOSIS — Z8041 Family history of malignant neoplasm of ovary: Secondary | ICD-10-CM

## 2014-01-26 DIAGNOSIS — Z800 Family history of malignant neoplasm of digestive organs: Secondary | ICD-10-CM

## 2014-01-26 DIAGNOSIS — Z803 Family history of malignant neoplasm of breast: Secondary | ICD-10-CM

## 2014-01-26 DIAGNOSIS — C50919 Malignant neoplasm of unspecified site of unspecified female breast: Secondary | ICD-10-CM

## 2014-01-26 NOTE — Progress Notes (Signed)
HPI: Ms. Loretta Thomas was seen in the [place] clinic due to a personal and family history of cancer and concerns regarding a hereditary predisposition to cancer in the family. Please refer to the prior Genetics clinic note for more information regarding Ms. Loretta Thomas's medical and family histories and our assessment at the time.   GENETIC TESTING: At the time of Ms. Loretta Thomas's visit, we recommended she pursue genetic testing of the OvaNext gene panel. This test, which included sequencing and deletion/duplication analysis of all of the genes, was performed at [facility]. 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 The remainder of the genes tested were negative for pathogenic mutations. A complete list of all genes analyzed is located on the test report scanned into Epic.  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 At this time, it is unknown if this variant is associated with an increased risk for cancer or if this is a normal finding. With time, we suspect the lab will reclassify this variant and when they do, we will try to re-contact Ms. Loretta Thomas to discuss the reclassification further.    PALB2 CANCER RISKS: It is important to note that the studies on cancer risk associated with the PALB2 gene are generally limited. For this reason, the cancer risk estimates below are likely to change as new data is obtained about PALB2 pathogenic mutations, such as the one found in Ms. Loretta Thomas. We discussed that pathogenic mutations in PALB2 have been shown to increase the risk of breast cancer to 33-58% by age 66 depending on the research study. PALB2 mutations have also been associated with an increased risk of pancreatic cancer, but specific risk estimates are not fully defined yet. It is also unknown, at this time, if PALB2 mutations may increase the risk for various other cancers.   MEDICAL MANAGEMENT:  Currently, there are limited medical management guidelines from the NCCN for women with a PALB2 mutation. Women should consider being seen at a high-risk breast clinic for a clinical breast exam twice per year. In addition to a yearly mammogram, the NCCN guidelines recommend a yearly breast MRI. Women may also wish to discuss the option of preventative bilateral mastectomies with their provider depending on their clinical and family histories of cancer; however, at this time there is no NCCN guideline recommendation for this specific intervention simply based on having a PALB2 mutation. It is also recommended Ms. Loretta Thomas continue having a yearly gynecologic exam and speak to her gastroenterologist about when to have her next colonoscopy.There is no screening for pancreatic cancer that is recommended.   FAMILY MEMBERS: It is important that Ms. Loretta Thomas informs her relatives of this genetic test result. It is recommended her relatives to speak with a genetic counselor prior to any testing. We are happy to help coordinate genetic counseling for any family member interested. Alternatively, a genetic counselor can be located at [email or web address].   We encouraged Ms. Loretta Thomas to remain in contact with [place] on an annual basis so we can update her personal and family histories, and let her know of advances in cancer genetics that may benefit the family. Our contact number was provided. Ms. Loretta Thomas's questions were answered to her satisfaction today, and she knows she is welcome to call anytime with additional questions.   Loretta A. Fine, MS, CGC  Certified Genetic Counselor  phone: [phone]  [email or web address]

## 2014-01-26 NOTE — Telephone Encounter (Signed)
Pt called to report getting a call from Genetics today stating she is positive for gene PALB#2.  She is scheduled for surgery on 10/15 with Dr. Excell Seltzer and Dr. Harlow Mares.  She is tearful and does not know what to do and what this means for her future.  She requests a call from Dr. Excell Seltzer.  SL

## 2014-01-27 MED ORDER — CEFAZOLIN SODIUM-DEXTROSE 2-3 GM-% IV SOLR
2.0000 g | INTRAVENOUS | Status: AC
  Administered 2014-01-28: 2 g via INTRAVENOUS

## 2014-01-27 MED ORDER — HEPARIN SODIUM (PORCINE) 5000 UNIT/ML IJ SOLN
5000.0000 [IU] | Freq: Once | INTRAMUSCULAR | Status: AC
  Administered 2014-01-28: 5000 [IU] via SUBCUTANEOUS
  Filled 2014-01-27: qty 1

## 2014-01-27 MED ORDER — CEFAZOLIN SODIUM-DEXTROSE 2-3 GM-% IV SOLR
2.0000 g | INTRAVENOUS | Status: DC
Start: 2014-01-28 — End: 2014-01-28
  Filled 2014-01-27: qty 50

## 2014-01-28 ENCOUNTER — Encounter (HOSPITAL_COMMUNITY): Payer: Self-pay | Admitting: *Deleted

## 2014-01-28 ENCOUNTER — Ambulatory Visit (HOSPITAL_COMMUNITY): Payer: BC Managed Care – PPO | Admitting: Anesthesiology

## 2014-01-28 ENCOUNTER — Encounter (HOSPITAL_COMMUNITY)
Admission: RE | Admit: 2014-01-28 | Discharge: 2014-01-28 | Disposition: A | Discharge status: Home / Self Care | Payer: BC Managed Care – PPO | Source: Ambulatory Visit | Attending: Plastic Surgery | Admitting: Plastic Surgery

## 2014-01-28 ENCOUNTER — Encounter (HOSPITAL_COMMUNITY)
Admission: RE | Disposition: A | Discharge status: Home / Self Care | Payer: Self-pay | Source: Ambulatory Visit | Attending: General Surgery

## 2014-01-28 ENCOUNTER — Encounter (HOSPITAL_COMMUNITY): Payer: BC Managed Care – PPO | Admitting: Vascular Surgery

## 2014-01-28 ENCOUNTER — Inpatient Hospital Stay (HOSPITAL_COMMUNITY)
Admission: RE | Admit: 2014-01-28 | Discharge: 2014-01-30 | DRG: 581 | Disposition: A | Discharge status: Home / Self Care | Payer: BC Managed Care – PPO | Source: Ambulatory Visit | Attending: Plastic Surgery | Admitting: Plastic Surgery

## 2014-01-28 DIAGNOSIS — D649 Anemia, unspecified: Secondary | ICD-10-CM | POA: Diagnosis present

## 2014-01-28 DIAGNOSIS — C50911 Malignant neoplasm of unspecified site of right female breast: Secondary | ICD-10-CM | POA: Diagnosis not present

## 2014-01-28 DIAGNOSIS — Z170 Estrogen receptor positive status [ER+]: Secondary | ICD-10-CM

## 2014-01-28 DIAGNOSIS — Z79899 Other long term (current) drug therapy: Secondary | ICD-10-CM

## 2014-01-28 DIAGNOSIS — E785 Hyperlipidemia, unspecified: Secondary | ICD-10-CM | POA: Diagnosis present

## 2014-01-28 DIAGNOSIS — C50919 Malignant neoplasm of unspecified site of unspecified female breast: Secondary | ICD-10-CM | POA: Diagnosis present

## 2014-01-28 DIAGNOSIS — I10 Essential (primary) hypertension: Secondary | ICD-10-CM | POA: Diagnosis present

## 2014-01-28 DIAGNOSIS — Z7982 Long term (current) use of aspirin: Secondary | ICD-10-CM

## 2014-01-28 HISTORY — PX: BREAST RECONSTRUCTION WITH PLACEMENT OF TISSUE EXPANDER AND FLEX HD (ACELLULAR HYDRATED DERMIS): SHX6295

## 2014-01-28 HISTORY — PX: SIMPLE MASTECTOMY WITH AXILLARY SENTINEL NODE BIOPSY: SHX6098

## 2014-01-28 SURGERY — SIMPLE MASTECTOMY WITH AXILLARY SENTINEL NODE BIOPSY
Anesthesia: General | Site: Breast | Laterality: Right

## 2014-01-28 MED ORDER — METHOCARBAMOL 500 MG PO TABS
500.0000 mg | ORAL_TABLET | Freq: Four times a day (QID) | ORAL | Status: DC
  Administered 2014-01-28 – 2014-01-30 (×7): 500 mg via ORAL
  Filled 2014-01-28 (×11): qty 1

## 2014-01-28 MED ORDER — 0.9 % SODIUM CHLORIDE (POUR BTL) OPTIME
TOPICAL | Status: DC | PRN
Start: 2014-01-28 — End: 2014-01-28
  Administered 2014-01-28 (×2): 1000 mL

## 2014-01-28 MED ORDER — PROPOFOL 10 MG/ML IV BOLUS
INTRAVENOUS | Status: AC
  Filled 2014-01-28: qty 20

## 2014-01-28 MED ORDER — ROCURONIUM BROMIDE 50 MG/5ML IV SOLN
INTRAVENOUS | Status: AC
  Filled 2014-01-28: qty 2

## 2014-01-28 MED ORDER — DOCUSATE SODIUM 100 MG PO CAPS
100.0000 mg | ORAL_CAPSULE | Freq: Every day | ORAL | Status: DC
  Administered 2014-01-29 – 2014-01-30 (×2): 100 mg via ORAL
  Filled 2014-01-28 (×2): qty 1

## 2014-01-28 MED ORDER — GLYCOPYRROLATE 0.2 MG/ML IJ SOLN
INTRAMUSCULAR | Status: DC | PRN
  Administered 2014-01-28: 0.6 mg via INTRAVENOUS

## 2014-01-28 MED ORDER — DEXAMETHASONE SODIUM PHOSPHATE 4 MG/ML IJ SOLN
INTRAMUSCULAR | Status: AC
  Filled 2014-01-28: qty 2

## 2014-01-28 MED ORDER — ALPRAZOLAM 0.5 MG PO TABS
0.5000 mg | ORAL_TABLET | Freq: Two times a day (BID) | ORAL | Status: DC | PRN
  Administered 2014-01-29 (×2): 0.5 mg via ORAL
  Filled 2014-01-28 (×2): qty 1

## 2014-01-28 MED ORDER — CHLORHEXIDINE GLUCONATE 4 % EX LIQD
1.0000 "application " | Freq: Once | CUTANEOUS | Status: DC
  Filled 2014-01-28: qty 15

## 2014-01-28 MED ORDER — MIDAZOLAM HCL 2 MG/2ML IJ SOLN
INTRAMUSCULAR | Status: AC
  Administered 2014-01-28: 2 mg
  Filled 2014-01-28: qty 2

## 2014-01-28 MED ORDER — PROMETHAZINE HCL 25 MG/ML IJ SOLN
6.2500 mg | INTRAMUSCULAR | Status: DC | PRN
  Administered 2014-01-29: 6.25 mg via INTRAVENOUS
  Filled 2014-01-28: qty 1

## 2014-01-28 MED ORDER — LIDOCAINE HCL (CARDIAC) 20 MG/ML IV SOLN
INTRAVENOUS | Status: AC
  Filled 2014-01-28: qty 5

## 2014-01-28 MED ORDER — TECHNETIUM TC 99M SULFUR COLLOID FILTERED
1.0000 | Freq: Once | INTRAVENOUS | Status: AC | PRN
  Administered 2014-01-28: 1 via INTRADERMAL

## 2014-01-28 MED ORDER — SODIUM CHLORIDE 0.9 % IJ SOLN
INTRAMUSCULAR | Status: DC | PRN
  Administered 2014-01-28: 15:00:00

## 2014-01-28 MED ORDER — SODIUM CHLORIDE 0.9 % IV SOLN
INTRAVENOUS | Status: DC
  Filled 2014-01-28: qty 1

## 2014-01-28 MED ORDER — ALBUMIN HUMAN 5 % IV SOLN
INTRAVENOUS | Status: DC | PRN
  Administered 2014-01-28: 13:00:00 via INTRAVENOUS

## 2014-01-28 MED ORDER — SODIUM CHLORIDE 0.9 % IJ SOLN
INTRAMUSCULAR | Status: AC
  Filled 2014-01-28: qty 10

## 2014-01-28 MED ORDER — FENTANYL CITRATE 0.05 MG/ML IJ SOLN
INTRAMUSCULAR | Status: DC | PRN
  Administered 2014-01-28: 100 ug via INTRAVENOUS
  Administered 2014-01-28 (×4): 50 ug via INTRAVENOUS

## 2014-01-28 MED ORDER — DEXAMETHASONE SODIUM PHOSPHATE 4 MG/ML IJ SOLN
INTRAMUSCULAR | Status: DC | PRN
  Administered 2014-01-28: 8 mg via INTRAVENOUS

## 2014-01-28 MED ORDER — HYDROMORPHONE HCL 2 MG PO TABS
2.0000 mg | ORAL_TABLET | ORAL | Status: DC | PRN
  Administered 2014-01-28: 2 mg via ORAL
  Administered 2014-01-29: 4 mg via ORAL
  Filled 2014-01-28: qty 2
  Filled 2014-01-28: qty 1

## 2014-01-28 MED ORDER — NEOSTIGMINE METHYLSULFATE 10 MG/10ML IV SOLN
INTRAVENOUS | Status: DC | PRN
  Administered 2014-01-28: 4 mg via INTRAVENOUS

## 2014-01-28 MED ORDER — FENTANYL CITRATE 0.05 MG/ML IJ SOLN
INTRAMUSCULAR | Status: AC
  Filled 2014-01-28: qty 5

## 2014-01-28 MED ORDER — ONDANSETRON HCL 4 MG/2ML IJ SOLN
INTRAMUSCULAR | Status: DC | PRN
  Administered 2014-01-28: 4 mg via INTRAVENOUS

## 2014-01-28 MED ORDER — ONDANSETRON HCL 4 MG/2ML IJ SOLN
INTRAMUSCULAR | Status: AC
  Filled 2014-01-28: qty 2

## 2014-01-28 MED ORDER — MIDAZOLAM HCL 5 MG/ML IJ SOLN: 2.0000 mg | Freq: Once | INTRAMUSCULAR | Status: DC

## 2014-01-28 MED ORDER — MIDAZOLAM HCL 2 MG/2ML IJ SOLN
INTRAMUSCULAR | Status: AC
  Filled 2014-01-28: qty 2

## 2014-01-28 MED ORDER — METHYLENE BLUE 1 % INJ SOLN
INTRAMUSCULAR | Status: AC
  Filled 2014-01-28: qty 10

## 2014-01-28 MED ORDER — DEXTROSE-NACL 5-0.45 % IV SOLN
INTRAVENOUS | Status: DC
  Administered 2014-01-29 (×2): via INTRAVENOUS

## 2014-01-28 MED ORDER — PROPOFOL 10 MG/ML IV BOLUS
INTRAVENOUS | Status: DC | PRN
  Administered 2014-01-28: 170 mg via INTRAVENOUS

## 2014-01-28 MED ORDER — SIMVASTATIN 20 MG PO TABS
20.0000 mg | ORAL_TABLET | Freq: Every day | ORAL | Status: DC
  Administered 2014-01-28 – 2014-01-30 (×3): 20 mg via ORAL
  Filled 2014-01-28 (×3): qty 1

## 2014-01-28 MED ORDER — NEOSTIGMINE METHYLSULFATE 10 MG/10ML IV SOLN
INTRAVENOUS | Status: AC
  Filled 2014-01-28: qty 1

## 2014-01-28 MED ORDER — FENTANYL CITRATE 0.05 MG/ML IJ SOLN
INTRAMUSCULAR | Status: AC
  Filled 2014-01-28: qty 2

## 2014-01-28 MED ORDER — LIDOCAINE HCL (CARDIAC) 20 MG/ML IV SOLN
INTRAVENOUS | Status: DC | PRN
  Administered 2014-01-28: 100 mg via INTRAVENOUS

## 2014-01-28 MED ORDER — SODIUM CHLORIDE 0.9 % IV SOLN
INTRAVENOUS | Status: DC | PRN
  Administered 2014-01-28: 15:00:00

## 2014-01-28 MED ORDER — LOSARTAN POTASSIUM 50 MG PO TABS
50.0000 mg | ORAL_TABLET | Freq: Every day | ORAL | Status: DC
  Administered 2014-01-28 – 2014-01-30 (×3): 50 mg via ORAL
  Filled 2014-01-28 (×3): qty 1

## 2014-01-28 MED ORDER — ROCURONIUM BROMIDE 100 MG/10ML IV SOLN
INTRAVENOUS | Status: DC | PRN
  Administered 2014-01-28: 20 mg via INTRAVENOUS
  Administered 2014-01-28: 10 mg via INTRAVENOUS
  Administered 2014-01-28: 50 mg via INTRAVENOUS

## 2014-01-28 MED ORDER — OXYCODONE HCL 5 MG PO TABS
5.0000 mg | ORAL_TABLET | Freq: Once | ORAL | Status: AC | PRN
  Administered 2014-01-28: 5 mg via ORAL

## 2014-01-28 MED ORDER — PHENYLEPHRINE HCL 10 MG/ML IJ SOLN
INTRAMUSCULAR | Status: DC | PRN
  Administered 2014-01-28: 80 ug via INTRAVENOUS
  Administered 2014-01-28: 40 ug via INTRAVENOUS

## 2014-01-28 MED ORDER — ZOLPIDEM TARTRATE 5 MG PO TABS: 10.0000 mg | ORAL_TABLET | Freq: Every evening | ORAL | Status: DC | PRN

## 2014-01-28 MED ORDER — MIDAZOLAM HCL 5 MG/5ML IJ SOLN
INTRAMUSCULAR | Status: DC | PRN
  Administered 2014-01-28: 2 mg via INTRAVENOUS

## 2014-01-28 MED ORDER — ONDANSETRON HCL 4 MG/2ML IJ SOLN: 4.0000 mg | Freq: Four times a day (QID) | INTRAMUSCULAR | Status: DC | PRN

## 2014-01-28 MED ORDER — CHLORHEXIDINE GLUCONATE 4 % EX LIQD
1.0000 | Freq: Once | CUTANEOUS | Status: DC
Start: 2014-01-28 — End: 2014-01-28
  Filled 2014-01-28: qty 15

## 2014-01-28 MED ORDER — HEPARIN SODIUM (PORCINE) 5000 UNIT/ML IJ SOLN
5000.0000 [IU] | Freq: Three times a day (TID) | INTRAMUSCULAR | Status: DC
  Administered 2014-01-29 – 2014-01-30 (×4): 5000 [IU] via SUBCUTANEOUS
  Filled 2014-01-28 (×7): qty 1

## 2014-01-28 MED ORDER — OXYCODONE HCL 5 MG/5ML PO SOLN: 5.0000 mg | Freq: Once | ORAL | Status: AC | PRN

## 2014-01-28 MED ORDER — GLYCOPYRROLATE 0.2 MG/ML IJ SOLN
INTRAMUSCULAR | Status: AC
  Filled 2014-01-28: qty 3

## 2014-01-28 MED ORDER — ROCURONIUM BROMIDE 50 MG/5ML IV SOLN
INTRAVENOUS | Status: AC
  Filled 2014-01-28: qty 1

## 2014-01-28 MED ORDER — CARVEDILOL 6.25 MG PO TABS
6.2500 mg | ORAL_TABLET | Freq: Two times a day (BID) | ORAL | Status: DC
  Administered 2014-01-28 – 2014-01-30 (×4): 6.25 mg via ORAL
  Filled 2014-01-28 (×6): qty 1

## 2014-01-28 MED ORDER — EPHEDRINE SULFATE 50 MG/ML IJ SOLN
INTRAMUSCULAR | Status: DC | PRN
  Administered 2014-01-28: 5 mg via INTRAVENOUS

## 2014-01-28 MED ORDER — OXYCODONE HCL 5 MG PO TABS
ORAL_TABLET | ORAL | Status: AC
  Filled 2014-01-28: qty 1

## 2014-01-28 MED ORDER — FENTANYL CITRATE 0.05 MG/ML IJ SOLN: 50.0000 ug | Freq: Once | INTRAMUSCULAR | Status: DC

## 2014-01-28 MED ORDER — LACTATED RINGERS IV SOLN
INTRAVENOUS | Status: DC
  Administered 2014-01-28 (×3): via INTRAVENOUS

## 2014-01-28 MED ORDER — CEFAZOLIN SODIUM 1-5 GM-% IV SOLN
1.0000 g | Freq: Four times a day (QID) | INTRAVENOUS | Status: DC
  Administered 2014-01-28 – 2014-01-30 (×7): 1 g via INTRAVENOUS
  Filled 2014-01-28 (×9): qty 50

## 2014-01-28 MED ORDER — FENTANYL CITRATE 0.05 MG/ML IJ SOLN
25.0000 ug | INTRAMUSCULAR | Status: DC | PRN
  Administered 2014-01-28 (×2): 25 ug via INTRAVENOUS

## 2014-01-28 SURGICAL SUPPLY — 80 items
ADH SKN CLS APL DERMABOND .7 (GAUZE/BANDAGES/DRESSINGS) ×1
ALLODERM TISSUE 4X16CM THICK (Tissue) ×2 IMPLANT
APPLIER CLIP 9.375 MED OPEN (MISCELLANEOUS)
APR CLP MED 9.3 20 MLT OPN (MISCELLANEOUS)
ATCH SMKEVC FLXB CAUT HNDSWH (FILTER) ×1 IMPLANT
BAG DECANTER FOR FLEXI CONT (MISCELLANEOUS) ×3 IMPLANT
BINDER BREAST LRG (GAUZE/BANDAGES/DRESSINGS) IMPLANT
BINDER BREAST XLRG (GAUZE/BANDAGES/DRESSINGS) ×2 IMPLANT
BIOPATCH RED 1 DISK 7.0 (GAUZE/BANDAGES/DRESSINGS) ×4 IMPLANT
BIOPATCH RED 1IN DISK 7.0MM (GAUZE/BANDAGES/DRESSINGS) ×2
BLADE 10 SAFETY STRL DISP (BLADE) ×3 IMPLANT
CANISTER SUCTION 2500CC (MISCELLANEOUS) ×9 IMPLANT
CHLORAPREP W/TINT 26ML (MISCELLANEOUS) ×6 IMPLANT
CLIP APPLIE 9.375 MED OPEN (MISCELLANEOUS) IMPLANT
CLIP TI MEDIUM 6 (CLIP) ×3 IMPLANT
CONT SPEC 4OZ CLIKSEAL STRL BL (MISCELLANEOUS) ×3 IMPLANT
COVER PROBE W GEL 5X96 (DRAPES) ×3 IMPLANT
COVER SURGICAL LIGHT HANDLE (MISCELLANEOUS) ×6 IMPLANT
DERMABOND ADVANCED (GAUZE/BANDAGES/DRESSINGS) ×2
DERMABOND ADVANCED .7 DNX12 (GAUZE/BANDAGES/DRESSINGS) ×2 IMPLANT
DEVICE DISSECT PLASMABLAD 3.0S (MISCELLANEOUS) ×1 IMPLANT
DRAIN CHANNEL 19F RND (DRAIN) ×7 IMPLANT
DRAPE CHEST BREAST 15X10 FENES (DRAPES) ×5 IMPLANT
DRAPE INCISE 23X17 IOBAN STRL (DRAPES) ×2
DRAPE INCISE 23X17 STRL (DRAPES) IMPLANT
DRAPE INCISE IOBAN 23X17 STRL (DRAPES) ×1 IMPLANT
DRAPE ORTHO SPLIT 77X108 STRL (DRAPES) ×6
DRAPE PROXIMA HALF (DRAPES) ×9 IMPLANT
DRAPE SURG 17X23 STRL (DRAPES) ×6 IMPLANT
DRAPE SURG ORHT 6 SPLT 77X108 (DRAPES) ×2 IMPLANT
DRAPE UTILITY 15X26 W/TAPE STR (DRAPE) ×6 IMPLANT
DRAPE WARM FLUID 44X44 (DRAPE) ×3 IMPLANT
DRSG PAD ABDOMINAL 8X10 ST (GAUZE/BANDAGES/DRESSINGS) ×6 IMPLANT
DRSG SORBAVIEW 3.5X5-5/16 MED (GAUZE/BANDAGES/DRESSINGS) ×6 IMPLANT
ELECT BLADE 6.5 EXT (BLADE) IMPLANT
ELECT CAUTERY BLADE 6.4 (BLADE) ×6 IMPLANT
ELECT REM PT RETURN 9FT ADLT (ELECTROSURGICAL) ×9
ELECTRODE REM PT RTRN 9FT ADLT (ELECTROSURGICAL) ×3 IMPLANT
EVACUATOR SILICONE 100CC (DRAIN) ×7 IMPLANT
EVACUATOR SMOKE ACCUVAC VALLEY (FILTER) ×2
GLOVE BIO SURGEON STRL SZ7.5 (GLOVE) ×3 IMPLANT
GLOVE BIOGEL PI IND STRL 8 (GLOVE) ×2 IMPLANT
GLOVE BIOGEL PI INDICATOR 8 (GLOVE) ×4
GLOVE SS BIOGEL STRL SZ 7.5 (GLOVE) ×1 IMPLANT
GLOVE SUPERSENSE BIOGEL SZ 7.5 (GLOVE) ×2
GOWN STRL REUS W/ TWL LRG LVL3 (GOWN DISPOSABLE) ×3 IMPLANT
GOWN STRL REUS W/ TWL XL LVL3 (GOWN DISPOSABLE) ×2 IMPLANT
GOWN STRL REUS W/TWL LRG LVL3 (GOWN DISPOSABLE) ×9
GOWN STRL REUS W/TWL XL LVL3 (GOWN DISPOSABLE) ×6
IMPL BREAST TIS EXP M 650CC (Breast) IMPLANT
IMPLANT BREAST TIS EXP M 650CC (Breast) ×3 IMPLANT
KIT BASIN OR (CUSTOM PROCEDURE TRAY) ×6 IMPLANT
KIT ROOM TURNOVER OR (KITS) ×6 IMPLANT
MARKER SKIN DUAL TIP RULER LAB (MISCELLANEOUS) ×3 IMPLANT
NDL 18GX1X1/2 (RX/OR ONLY) (NEEDLE) ×1 IMPLANT
NDL HYPO 25GX1X1/2 BEV (NEEDLE) ×1 IMPLANT
NEEDLE 18GX1X1/2 (RX/OR ONLY) (NEEDLE) ×3 IMPLANT
NEEDLE HYPO 25GX1X1/2 BEV (NEEDLE) ×3 IMPLANT
NS IRRIG 1000ML POUR BTL (IV SOLUTION) ×9 IMPLANT
PACK GENERAL/GYN (CUSTOM PROCEDURE TRAY) ×6 IMPLANT
PAD ARMBOARD 7.5X6 YLW CONV (MISCELLANEOUS) ×6 IMPLANT
PLASMABLADE 3.0S (MISCELLANEOUS) ×3
PREFILTER EVAC NS 1 1/3-3/8IN (MISCELLANEOUS) ×3 IMPLANT
SPECIMEN JAR X LARGE (MISCELLANEOUS) ×3 IMPLANT
STAPLER VISISTAT 35W (STAPLE) ×2 IMPLANT
SUT ETHILON 2 0 FS 18 (SUTURE) ×3 IMPLANT
SUT MNCRL AB 3-0 PS2 18 (SUTURE) ×12 IMPLANT
SUT MNCRL AB 4-0 PS2 18 (SUTURE) ×3 IMPLANT
SUT PDS AB 3-0 SH 27 (SUTURE) IMPLANT
SUT PROLENE 3 0 PS 2 (SUTURE) ×10 IMPLANT
SUT VIC AB 3-0 54X BRD REEL (SUTURE) IMPLANT
SUT VIC AB 3-0 BRD 54 (SUTURE)
SUT VIC AB 3-0 SH 18 (SUTURE) ×6 IMPLANT
SYR BULB IRRIGATION 50ML (SYRINGE) ×3 IMPLANT
SYR CONTROL 10ML LL (SYRINGE) ×3 IMPLANT
TOWEL OR 17X24 6PK STRL BLUE (TOWEL DISPOSABLE) ×8 IMPLANT
TOWEL OR 17X26 10 PK STRL BLUE (TOWEL DISPOSABLE) ×6 IMPLANT
TRAY FOLEY CATH 14FRSI W/METER (CATHETERS) IMPLANT
TUBE CONNECTING 12'X1/4 (SUCTIONS) ×2
TUBE CONNECTING 12X1/4 (SUCTIONS) ×4 IMPLANT

## 2014-01-28 NOTE — Progress Notes (Signed)
Received patient from PACU post right breast surgery. Alert and oriented, plan of care discussed with patient and family. Incision CDI, drain in place, foley draining well, VSS. Oriented to room and staff. Will continue to monitor. Endorsed to incoming nurse.

## 2014-01-28 NOTE — Op Note (Signed)
Preoperative Diagnosis: cancer right breast  Postoprative Diagnosis: cancer right breast  Procedure: Procedure(s): BLUE DYE INJECTION RIGHT BREAST, RIGHT TOTAL MASTECTOMY WITH  RIGHT AXILLARY SENTINEL NODE BIOPSY    Surgeon: Excell Seltzer T   Assistants:  none  Anesthesia:  General endotracheal anesthesia  Indications: Patient is a 63 year old female recently diagnosed with a large area of ductal carcinoma in situ with a small area of invasive disease in the right breast.  After extensive discussion and consultation detailed elsewhere we have elected proceed with right total mastectomy with sentinel lymph node biopsy and possible axillary dissection with immediate reconstruction as her initial surgical therapy.    Procedure Detail:  Following injection of 1 mCi of technetium sulfur colloid intradermally around the right nipple in the holding area the patient was brought to the operating room, placed in the supine position on the operating table and general anesthesia induced. She was carefully positioned with the right arm extended. The right breast was sterilely prepped and after patient timeout I injected 5 cc of dilute methylene blue subcutaneously beneath the right nipple and massaged this for several minutes. The entire right breast and chest and upper arm are widely sterilely prepped and draped. Patient timeout was again performed and correct procedure verified. I made a curvilinear elliptical incision encompassing the nipple areolar complex and preserving as much skin as possible. The skin and subcutaneous flaps were then developed medially to the edge of the sternum, superiorly up toward the clavicle, inferiorly to the inframammary crease and laterally out to the anterior border of the latissimus. I localized a hot area in the axilla at this point with the neoprobe and 1 node with high counts was excised with blunt dissection and cautery. There were still very high counts in the axilla  but difficult to expose through this approach I elected to look at this again after the rest was reflected off the chest wall. Using the plasma blade the breast tissue was reflected off of the pectoralis major working medial to lateral and superior to inferior preserving the fascia. Attachments along the serratus and inferiorly along the latissimus were divided working up toward the axilla. Attachments along the lateral border of the pectoralis major were divided and the pectoralis was retracted medially and the axilla exposed. The clavipectoral fascia was incised. Using the neoprobe localized another node with increased counts as well as a third node with very high counts And blue dye. These were excised completely and sent for permanent pathology. The specimen was completely mobilized to the low axilla I came across this with Kelly clamps and the specimen was removed. It was oriented and sent for permanent pathology. The wound was thoroughly irrigated and hemostasis assured. Dr. Harlow Mares then proceeded with reconstruction. The touch prep on the sentinel lymph nodes were all negative for tumor.    Findings: As above  Estimated Blood Loss:  Minimal         Drains: per Dr. Harlow Mares  Blood Given: none          Specimens: #1 right total mastectomy   #2 hot non-blue right axillary sentinel lymph node   #3 hot non-blue right axillary sentinel lymph node    #4 hot and blue right axillary sentinel lymph node        Complications:  * No complications entered in OR log *         Disposition: PACU - hemodynamically stable.         Condition: stable

## 2014-01-28 NOTE — H&P (Signed)
Chief Complaint: New diagnosis of breast cancer  History:   Loretta Thomas is a 63 y.o. postmenopausal female referred by Dr. 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 She recently presented for a screening mamogram revealing A new area of microcalcifications in the central right breast.. Subsequent imaging included diagnostic mamogram showing A 6 x 6 x 12 mm group of heterogeneous calcifications in the middle third outer right breast with suspicious for DCIS. 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 Subsequent bilateral breast MRI was performed. This has revealed within the central retroareolar region of the right breast a small hematoma post biopsy but also significant additional nonmacerated enhancement in the retroareolar region extending to the nipple with the area of enhancement measuring 4.3 x 2.5 x 3.0 cm. She is now seen back in the office following MR guided biopsy of the more extensive retroareolar enhancement seen on MRI. This has unfortunately revealed DCIS. The patient is brought back to the office today to discuss the diagnosis and treatment planning.  Findings at that time were the following:  Tumor size: 4.5 cm  Tumor grade: 3  Estrogen Receptor: positive  Progesterone Receptor: positive  Her-2 neu: negative  Lymph node status: negative  Past Medical History   Diagnosis  Date   .  Anemia    .  Hyperlipidemia    .  Hypertension    .  Cancer      right breast    Past Surgical History   Procedure  Laterality  Date   .  Breast biopsy       right breast   .  Tonsillectomy and adenoidectomy     .  Rhinoplasty     .  Ankle fracture surgery     .  Cholecystectomy     .  Cardiac catheterization       x3   .  Colonoscopy       x4   .  Tubal ligation     .  Dilation and curettage of uterus       Current Outpatient Prescriptions   Medication  Sig  Dispense  Refill   .  ALPRAZolam (XANAX XR) 0.5 MG 24 hr tablet  Take 0.5 mg by mouth daily.     Marland Kitchen  aspirin 81 MG tablet  Take 81 mg by mouth daily.     .  carvedilol (COREG) 6.25 MG tablet  Take 6.25 mg by mouth 2 (two) times daily with a meal.     .  losartan (COZAAR) 50 MG tablet  Take 50 mg by mouth daily.     .  simvastatin (ZOCOR) 20 MG tablet  Take 20 mg by mouth daily.     .  vitamin B-12 (CYANOCOBALAMIN) 50 MCG tablet  Take 50 mcg by mouth daily.     Marland Kitchen  zolpidem (AMBIEN) 10 MG tablet  Take 10 mg by mouth at bedtime as needed for sleep.      No current facility-administered medications for this visit.    Family History   Problem  Relation  Age of Onset   .  Cancer  Sister      skin and colon   .  Cancer  Brother      esophageal   .  Cancer  Maternal Aunt      breast    History  Social History   .  Marital Status:  Married     Spouse Name:  N/A     Number of Children:  N/A   .  Years of Education:  N/A    Social History Main Topics   .  Smoking status:  Never Smoker   .  Smokeless tobacco:  None   .  Alcohol Use:  No   .  Drug Use:  No   .  Sexual Activity:  None    Other Topics  Concern   .  None    Social History Narrative   .  None   Review of Systems  Respiratory: negative  Cardiovascular: negative, she has previous history of cardiac catheter x3 after episode of hypertension and concern of her mitral prolapse but reportedly all negative  Gastrointestinal: positive for constipation, diarrhea and related to IBS  Hematologic/lymphatic: negative, gives a history of previous pernicious anemia and low platelet count that has resolved  Objective:   BP 138/82  Pulse 71  Temp(Src) 98.6 F (37 C) (Oral)  Resp 15  Ht '5\' 7"'  (1.702 m)  Wt 189 lb 6 oz (85.9 kg)  BMI 29.65 kg/m2  SpO2 98%  General: Alert, well-developed Caucasian female, in no distress  Skin: Warm and dry without rash or infection.   HEENT: No palpable masses or thyromegaly. Sclera nonicteric. Pupils equal round and reactive. Oropharynx clear.  Breasts: Some slight bruising and tenderness lateral right breast. No definite masses or skin changes or nipple retraction.  Lymph nodes: No cervical, supraclavicular, or inguinal nodes palpable.  Lungs: Breath sounds clear and equal without increased work of breathing  Cardiovascular: Regular rate and rhythm without murmur. No JVD or edema. Peripheral pulses intact.  Abdomen: Nondistended. Soft and nontender. No masses palpable. No organomegaly. No palpable hernias.  Extremities: No edema or joint swelling or deformity. No chronic venous stasis changes.  Neurologic: Alert and fully oriented. Gait normal.  Laboratory data:  CBC:  No results found for this basename: WBC, RBC, HGB, HCT, PLT   ]  CMG Labs:  No results found for this basename: GLUF, NA, K, CL, CO2, BUN, CREATININE, CALCIUM, PROT, ALB, BILITOT, BILIDIR, ALKPHOS, AST, ALT   Assessment  63 y.o. female with a new diagnosis of cancer of the the right breast central and outer breast. Clinical IA, estrogen receptor positive, progesterone receptor positive and Her2/Neu protein/oncogene negative. MRI has shown more locally extensive disease extending up to behind and involving the nipple Measuring up to 4.5 cm in greatest extent..As we had previously discussed at her first visit this finding of locally extensive disease and involvement of the nipple I believe she would be best served with total mastectomy. This was discussed with the patient and her family today in great detail. She was upset with this news but accepting. I believe she would be a candidate for immediate reconstruction and she is interested in this. We will make a referral to plastic surgery for her. The patient also is asking about genetic counseling. She does have some history of breast cancer and ovarian cancer in her mother and aunt and I'm going to refer her for  genetic counseling. We will have her see physical therapy regarding lymphedema prevention. I am recommending right total mastectomy with axillary sentinel lymph node biopsy and possible axillary dissection with likely immediate reconstruction. We discussed the indications for the procedure and risks of anesthetic complications, bleeding, infection, wound healing problems and lymphedema. All her questions were answered.  She is in agreement with the plan.  Plan  Right total mastectomy with axillary sentinel lymph node biopsy and possible axillary dissection. She is interested in immediate reconstruction and she is being referred to plastic surgery. She will see genetic counselor and physical therapy preop.

## 2014-01-28 NOTE — Brief Op Note (Signed)
01/28/2014  4:16 PM  PATIENT:  Loretta Thomas  63 y.o. female  PRE-OPERATIVE DIAGNOSIS:  cancer right breast  POST-OPERATIVE DIAGNOSIS:  cancer right breast  PROCEDURE:  Procedure(s): RIGHT TOTAL MASTECTOMY WITH  RIGHT AXILLARY SENTINEL NODE BIOPSY (Right) RIGHT BREAST RECONSTRUCTION WITH PLACEMENT OF TISSUE EXPANDER (Right) RIGHT CHEST WALL RECONSTRUCTION WITH ALLODERM  SURGEON:  Surgeon(s) and Role: Panel 1:    * Excell Seltzer, MD - Primary  Panel 2:    * Crissie Reese, MD - Primary  PHYSICIAN ASSISTANT:   ASSISTANTS: none   ANESTHESIA:   general  EBL:  Total I/O In: 2250 [I.V.:2000; IV Piggyback:250] Out: 450 [Urine:350; Blood:100]  BLOOD ADMINISTERED:none  DRAINS: (2) Jackson-Pratt drain(s) with closed bulb suction in the right chest   LOCAL MEDICATIONS USED:  NONE  SPECIMEN:  No Specimen  DISPOSITION OF SPECIMEN:  N/A  COUNTS:  YES  TOURNIQUET:  * No tourniquets in log *  DICTATION: .Other Dictation: Dictation Number [id number]  PLAN OF CARE: Admit for overnight observation  PATIENT DISPOSITION:  PACU - hemodynamically stable.   Delay start of Pharmacological VTE agent (>24hrs) due to surgical blood loss or risk of bleeding: no

## 2014-01-28 NOTE — Anesthesia Preprocedure Evaluation (Signed)
Anesthesia Evaluation  Patient identified by MRN, date of birth, ID band Patient awake    Reviewed: Allergy & Precautions, H&P , NPO status , Patient's Chart, lab work & pertinent test results  Airway Mallampati: II  Neck ROM: full    Dental   Pulmonary neg pulmonary ROS,          Cardiovascular hypertension,     Neuro/Psych Anxiety    GI/Hepatic   Endo/Other    Renal/GU      Musculoskeletal  (+) Arthritis -,   Abdominal   Peds  Hematology  (+) anemia ,   Anesthesia Other Findings   Reproductive/Obstetrics Breast CA                           Anesthesia Physical Anesthesia Plan  ASA: II  Anesthesia Plan: General   Post-op Pain Management:    Induction: Intravenous  Airway Management Planned: Oral ETT  Additional Equipment:   Intra-op Plan:   Post-operative Plan: Extubation in OR  Informed Consent: I have reviewed the patients History and Physical, chart, labs and discussed the procedure including the risks, benefits and alternatives for the proposed anesthesia with the patient or authorized representative who has indicated his/her understanding and acceptance.     Plan Discussed with: CRNA, Anesthesiologist and Surgeon  Anesthesia Plan Comments:         Anesthesia Quick Evaluation

## 2014-01-28 NOTE — Transfer of Care (Signed)
Immediate Anesthesia Transfer of Care Note  Patient: Loretta Thomas  Procedure(s) Performed: Procedure(s): RIGHT TOTAL MASTECTOMY WITH  RIGHT AXILLARY SENTINEL NODE BIOPSY (Right) RIGHT BREAST RECONSTRUCTION WITH PLACEMENT OF TISSUE EXPANDER (Right)  Patient Location: PACU  Anesthesia Type:General  Level of Consciousness: sedated  Airway & Oxygen Therapy: Patient Spontanous Breathing and Patient connected to face mask oxygen  Post-op Assessment: Report given to PACU RN and Post -op Vital signs reviewed and stable  Post vital signs: Reviewed and stable  Complications: No apparent anesthesia complications

## 2014-01-28 NOTE — Anesthesia Procedure Notes (Signed)
Procedure Name: Intubation Date/Time: 01/28/2014 12:19 PM Performed by: Maude Leriche D Pre-anesthesia Checklist: Patient identified, Emergency Drugs available, Suction available, Patient being monitored and Timeout performed Patient Re-evaluated:Patient Re-evaluated prior to inductionOxygen Delivery Method: Circle system utilized Intubation Type: IV induction Ventilation: Mask ventilation without difficulty and Oral airway inserted - appropriate to patient size Laryngoscope Size: Sabra Heck and 2 Grade View: Grade I Tube type: Oral Tube size: 7.0 mm Number of attempts: 1 Airway Equipment and Method: Stylet Placement Confirmation: ETT inserted through vocal cords under direct vision and breath sounds checked- equal and bilateral Secured at: 21 cm Tube secured with: Tape Dental Injury: Teeth and Oropharynx as per pre-operative assessment

## 2014-01-28 NOTE — Anesthesia Postprocedure Evaluation (Signed)
  Anesthesia Post-op Note  Patient: Loretta Thomas  Procedure(s) Performed: Procedure(s): RIGHT TOTAL MASTECTOMY WITH  RIGHT AXILLARY SENTINEL NODE BIOPSY (Right) RIGHT BREAST RECONSTRUCTION WITH PLACEMENT OF TISSUE EXPANDER (Right)  Patient Location: PACU  Anesthesia Type:General  Level of Consciousness: awake, alert  and oriented  Airway and Oxygen Therapy: Patient Spontanous Breathing  Post-op Pain: none  Post-op Assessment: Post-op Vital signs reviewed  Post-op Vital Signs: Reviewed  Last Vitals:  Filed Vitals:   01/28/14 1740  BP: 129/72  Pulse: 78  Temp:   Resp: 11    Complications: No apparent anesthesia complications

## 2014-01-29 DIAGNOSIS — Z7982 Long term (current) use of aspirin: Secondary | ICD-10-CM | POA: Diagnosis not present

## 2014-01-29 DIAGNOSIS — I10 Essential (primary) hypertension: Secondary | ICD-10-CM | POA: Diagnosis present

## 2014-01-29 DIAGNOSIS — D649 Anemia, unspecified: Secondary | ICD-10-CM | POA: Diagnosis present

## 2014-01-29 DIAGNOSIS — E785 Hyperlipidemia, unspecified: Secondary | ICD-10-CM | POA: Diagnosis present

## 2014-01-29 DIAGNOSIS — Z79899 Other long term (current) drug therapy: Secondary | ICD-10-CM | POA: Diagnosis not present

## 2014-01-29 DIAGNOSIS — C50911 Malignant neoplasm of unspecified site of right female breast: Secondary | ICD-10-CM | POA: Diagnosis present

## 2014-01-29 DIAGNOSIS — Z170 Estrogen receptor positive status [ER+]: Secondary | ICD-10-CM | POA: Diagnosis not present

## 2014-01-29 MED ORDER — HYDROMORPHONE HCL 2 MG PO TABS
2.0000 mg | ORAL_TABLET | ORAL | Status: DC | PRN
  Administered 2014-01-29 – 2014-01-30 (×5): 4 mg via ORAL
  Filled 2014-01-29 (×5): qty 2

## 2014-01-29 MED ORDER — ALUM & MAG HYDROXIDE-SIMETH 200-200-20 MG/5ML PO SUSP
30.0000 mL | ORAL | Status: DC | PRN
  Administered 2014-01-29 – 2014-01-30 (×2): 30 mL via ORAL
  Filled 2014-01-29 (×2): qty 30

## 2014-01-29 MED ORDER — WHITE PETROLATUM GEL
Status: AC
  Administered 2014-01-29: 17:00:00
  Filled 2014-01-29: qty 5

## 2014-01-29 NOTE — Progress Notes (Signed)
Patient ID: Loretta Thomas, female   DOB: 01/04/1951, 63 y.o.   MRN: [id number] 1 Day Post-Op  Subjective: Moderate pain right chest, no other C/O  Objective: Vital signs in last 24 hours: Temp:  [97.7 F (36.5 C)-99 F (37.2 C)] 98.6 F (37 C) (10/16 0935) Pulse Rate:  [64-90] 78 (10/16 0935) Resp:  [10-21] 17 (10/16 0935) BP: (116-140)/(57-84) 118/60 mmHg (10/16 0935) SpO2:  [95 %-100 %] 95 % (10/16 0935)    Intake/Output from previous day: 10/15 0701 - 10/16 0700 In: 3045 [P.O.:120; I.V.:2525; IV Piggyback:400] Out: 2397 [Urine:2125; Drains:172; Blood:100] Intake/Output this shift: Total I/O In: 180 [P.O.:180] Out: 30 [Drains:30]  General appearance: alert, cooperative and no distress Incision/Wound: Flaps appear healthy with evidence of bleeding.  JPs serosanguinous  Lab Results:  No results found for this basename: WBC, HGB, HCT, PLT,  in the last 72 hours BMET No results found for this basename: NA, K, CL, CO2, GLUCOSE, BUN, CREATININE, CALCIUM,  in the last 72 hours   Studies/Results: Nm Sentinel Node Inj-no Rpt (breast)  01/28/2014   CLINICAL DATA: Cancer of the right breast   Sulfur colloid was injected intradermally by the nuclear medicine  technologist for breast cancer sentinel node localization.     Anti-infectives: Anti-infectives   Start     Dose/Rate Route Frequency Ordered Stop   01/28/14 1930  ceFAZolin (ANCEF) IVPB 1 g/50 mL premix     1 g 100 mL/hr over 30 Minutes Intravenous 4 times per day 01/28/14 1836     01/28/14 1510  bacitracin 50,000 Units, gentamicin (GARAMYCIN) 80 mg, ceFAZolin (ANCEF) 1 g in sodium chloride 0.9 % 1,000 mL  Status:  Discontinued       As needed 01/28/14 1510 01/28/14 1628   01/28/14 1115  bacitracin 50,000 Units, gentamicin (GARAMYCIN) 80 mg, ceFAZolin (ANCEF) 1 g in sodium chloride 0.9 % 1,000 mL  Status:  Discontinued      Irrigation To Surgery 01/28/14 1109 01/28/14 1815   01/28/14 0600  ceFAZolin (ANCEF) IVPB 2 g/50  mL premix     2 g 100 mL/hr over 30 Minutes Intravenous On call to O.R. 01/27/14 1452 01/28/14 1217   01/28/14 0600  ceFAZolin (ANCEF) IVPB 2 g/50 mL premix  Status:  Discontinued     2 g 100 mL/hr over 30 Minutes Intravenous On call to O.R. 01/27/14 1452 01/28/14 1815      Assessment/Plan: s/p Procedure(s): RIGHT TOTAL MASTECTOMY WITH  RIGHT AXILLARY SENTINEL NODE BIOPSY RIGHT BREAST RECONSTRUCTION WITH PLACEMENT OF TISSUE EXPANDER Doing well postoperatively Discharge plans per Dr Harlow Mares   LOS: 1 day    Velecia Ovitt T 01/29/2014

## 2014-01-29 NOTE — Progress Notes (Signed)
Subjective: Nauseated. Not taking POs well. Also feeels weak and unable to ambulate without assistance.  Objective: Vital signs in last 24 hours: Temp:  [97.7 F (36.5 C)-99 F (37.2 C)] 98.6 F (37 C) (10/16 0935) Pulse Rate:  [66-90] 78 (10/16 0935) Resp:  [10-21] 17 (10/16 0935) BP: (116-136)/(57-84) 118/60 mmHg (10/16 0935) SpO2:  [95 %-100 %] 95 % (10/16 0935)  Intake/Output from previous day: 10/15 0701 - 10/16 0700 In: 3045 [P.O.:120; I.V.:2525; IV Piggyback:400] Out: 2397 [Urine:2125; Drains:172; Blood:100] Intake/Output this shift: Total I/O In: 180 [P.O.:180] Out: 30 [Drains:30]  Operative sites: Mastectomy flaps viable. Good color. Tissue expander is in good position. Drains functioning. Drainage thin. There is no evidence of bleeding or infection.  No results found for this basename: WBC, HGB, HCT, PLATELETS, NA, K, CL, CO2, BUN, CREATININE, GLU,  in the last 72 hours  Studies/Results: Nm Sentinel Node Inj-no Rpt (breast)  01/28/2014   CLINICAL DATA: Cancer of the right breast   Sulfur colloid was injected intradermally by the nuclear medicine  technologist for breast cancer sentinel node localization.     Assessment/Plan: She needs to remain in hospital due to inability to take fluids and inability to ambulate independently. The surgical site looks good.   LOS: 1 day    Macon Large 01/29/2014 12:43 PM

## 2014-01-29 NOTE — Op Note (Signed)
NAME:  MAYE, PARKINSON NO.:  [id number]  MEDICAL RECORD NO.:  [id number]  LOCATION:  [facility]                          FACILITY:  [facility]  PHYSICIAN:  Crissie Reese, M.D.     DATE OF BIRTH:  12-29-1950  DATE OF PROCEDURE:  01/28/2014 DATE OF DISCHARGE:                              OPERATIVE REPORT   PREOPERATIVE DIAGNOSIS: 1. Right breast cancer. 2. 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  POSTOPERATIVE DIAGNOSIS: 1. Right breast cancer. 2. 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  PROCEDURE PERFORMED: 1. Immediate breast reconstruction, right side with tissue expander. 2. Reconstruction of chest wall due to inadequacy of chest wall muscle     using acellular dermal matrix (AlloDerm), total 32 squared cm.as a distinct procedure  SURGEON:  Crissie Reese, M.D.  ANESTHESIA:  General.  ESTIMATED BLOOD LOSS:  15 mL.  DRAINS:  Two 19-French.  CLINICAL NOTE:  A 63 year old woman has right breast cancer and is having mastectomy.  She desires reconstruction.  Options were discussed, and she selected placement of tissue expanders as planned, staged procedure for eventual placement of her implant.  Next, procedure and risks were discussed with her in great detail as well as the possible use of an acellular dermal matrix.  AlloDerm was approved by her insurance company.  The risks included, but not limited to, bleeding, infection, healing problems, scarring, loss of sensation, fluid accumulations, anesthesia related complications, pneumothorax, DVT, PE, failure of device, capsular contracture, displacement of device, wrinkles and ripples, contour deformity, contour deformity at the periphery of the reconstruction, loss of tissue, loss of skin on the mastectomy flaps, asymmetry, chronic pain, and overall disappointment. She also understood the possibility of secondary procedures and wished to proceed.  DESCRIPTION OF PROCEDURE:  The patient was in  the operating room and General Surgery completed the mastectomy.  The skin flaps were inspected and found to be in good condition with a good capillary refill at 1 to 2 seconds and bright red bleeding along the edges consistent with viability.  The area was covered sterilely with impervious Steri-Drape. After the drapes had been removed, this was then removed and she was prepped with Betadine and redraped with impervious drapes around the periphery as well as sterile drapes.  The wound was irrigated with solution as well as antibiotic solution and the space was again inspected and noted to be in excellent condition.  The dissection was carried deep to the pectoralis major muscle and a submuscular space was developed.  There was an adequate muscle at the inferomedial aspect. Otherwise, a complete submuscular space was developed for the tissue expander.  Irrigation with saline as well as antibiotic solution and hemostasis with electrocautery.  The expander was prepared by soaking it in antibiotic solution.  After thoroughly cleaning gloves, 100 mL sterile saline placed using a closed filling system.  All air was removed and it was returned to the antibiotic solution.  The AlloDerm was prepared by soaking in saline 2 separate times for greater than 10 minute and then in antibiotic solution for greater than 10 minutes.  The AlloDerm was then oriented by placing the most dimpled side towards the mastectomy flaps.  This was confirmed to be the dermal side by placing both sides of the AlloDerm on a small amount of blood and this was the side that stained pink.  This side was therefore placed up towards the mastectomy flap for revascularization.  It was secured with 3-0 Prolene simple interrupted sutures around its periphery at the inferomedial aspect, a total of approximately 32 squared cm.  After thoroughly cleaning gloves, the tissue expander was positioned and care was taken to make sure it  was oriented properly without wrinkles, and it was then filled to 350 mL of sterile saline using again the closed filling system.  This seemed to give her a nice fill volume with no tension on the skin for closure.  The skin edges were again inspected and found to be in excellent condition.  Hemostasis with electrocautery.  An excellent hemostasis having been confirmed, a 19-French drain positioned at the medial aspect and brought through separate stab wound inferomedially and another laterally into the area of the lymph node biopsy.  Both of these drains were secured with 3-0 Prolene sutures. The skin closure with 3-0 Monocryl interrupted inverted deep dermal sutures and Dermabond.  The drains were dressed with Biopatch and impervious dressing, and she was transported to the recovery room after having a dry sterile dressing and a chest vest placed around her.  She tolerated the procedure well.  DISPOSITION:  She will be observed overnight.     Crissie Reese, M.D.     DB/MEDQ  D:  01/28/2014  T:  01/29/2014  Job:  [id number]

## 2014-01-29 NOTE — Care Management Note (Signed)
  Page 1 of 1   01/29/2014     2:00:51 PM CARE MANAGEMENT NOTE 01/29/2014  Patient:  Loretta Thomas, Loretta Thomas   Account Number:  [id number]  Date Initiated:  01/29/2014  Documentation initiated by:  Magdalen Spatz  Subjective/Objective Assessment:     Action/Plan:   Anticipated DC Date:     Anticipated DC Plan:           Choice offered to / List presented to:             Status of service:   Medicare Important Message given?   (If response is "NO", the following Medicare IM given date fields will be blank) Date Medicare IM given:   Medicare IM given by:   Date Additional Medicare IM given:   Additional Medicare IM given by:    Discharge Disposition:    Per UR Regulation:    If discussed at Long Length of Stay Meetings, dates discussed:    Comments:  MD ordered inpatient on 01-29-14 at 1355 , due to nausea , poor PO intake and IVF, IV ABX . Inpatient status approved by MD advisor Dr Reynaldo Minium .   Emailed [employer] to notify [employer] today .   Magdalen Spatz RN BSN

## 2014-01-30 ENCOUNTER — Encounter (HOSPITAL_COMMUNITY): Payer: Self-pay | Admitting: General Surgery

## 2014-01-30 MED ORDER — CEPHALEXIN 500 MG PO CAPS: 500.0000 mg | ORAL_CAPSULE | Freq: Three times a day (TID) | ORAL | Status: DC

## 2014-01-30 MED ORDER — HYDROMORPHONE HCL 2 MG PO TABS: 2.0000 mg | ORAL_TABLET | ORAL | Status: DC | PRN

## 2014-01-30 MED ORDER — METHOCARBAMOL 500 MG PO TABS: 500.0000 mg | ORAL_TABLET | Freq: Four times a day (QID) | ORAL | Status: DC

## 2014-01-30 MED ORDER — DSS 100 MG PO CAPS: 100.0000 mg | ORAL_CAPSULE | Freq: Every day | ORAL | Status: DC

## 2014-01-30 MED ORDER — ENOXAPARIN SODIUM 40 MG/0.4ML ~~LOC~~ SOLN: 40.0000 mg | SUBCUTANEOUS | Status: DC

## 2014-01-30 NOTE — Progress Notes (Signed)
AVS discharge instructions were given and went over with patient and her husband and sister. Patient stated that Dr. Harlow Mares gave her the prescriptions to take to her pharmacy. Patient and her sister were given written information on how to care for JP drain and patient's sister stated that she felt comfortable with emptying drain.  Went over with patient's sister how to give a lovonox injection, patient's sister stated that she was familiar with given injections. Patient and her family stated that they did not have any questions. Staff assisted patient to her transportation.

## 2014-01-30 NOTE — Discharge Instructions (Addendum)
No lifting for 6 weeks No vigorous activity for 6 weeks (including outdoor walks) No driving for 4 weeks OK to walk up stairs slowly Stay propped up Use incentive spirometer at home every hour while awake No shower while drains are in place Empty drains at least three times a day and record the amounts separately Change drain dressings every third day if instructed to do so by Dr. Harlow Mares  Apply Bacitracin antibiotic ointment to the drain sites  Place gauze dressing over drains  Secure the gauze with tape Take an over-the-counter Probiotic while on antibiotics Take an over-the-counter stool softener (such as Colace) while on pain medication See Dr. Harlow Mares this coming week  For questions call [phone] or [phone]

## 2014-01-30 NOTE — Discharge Summary (Signed)
Physician Discharge Summary  Patient ID: Loretta Thomas MRN: [id number] DOB/AGE: Nov 25, 1950 63 y.o.  Admit date: 01/28/2014 Discharge date: 01/30/2014  Admission Diagnoses:Right breast cancer  Discharge Diagnoses: Same Active Problems:   Breast cancer   Discharged Condition: good  Hospital Course: On the day of admission the patient was taken to surgery and had right mastectomy, sentinel node, reconstruction with tissue expander and Alloderm. The patient tolerated the procedures well. Postoperatively, the mastectomy flaps maintained excellent color and capillary refill. The patient was not ambulatory and tolerating diet on the first postoperative day and needed to stay an additional day as a result. On the second day she was markedly improved, tolerating diet, ambulating well, and ready for discharge.  Treatments: antibiotics: Ancef, anticoagulation: heparin and surgery: right mastectomy, sentinel node, tissue expander, and chest wall reconstruction with Alloderm.  Discharge Exam: Blood pressure 131/69, pulse 83, temperature 98.5 F (36.9 C), temperature source Oral, resp. rate 16, height 5\' 7"  (1.702 m), weight 189 lb 6 oz (85.9 kg), SpO2 95.00%.  Operative sites: Mastectomy flaps viable. Tissue expander in good position. Drains functioning. Drainage thin. No evidence of bleeding or infection.  Disposition: Discharge home     Medication List    STOP taking these medications       Vitamin D (Ergocalciferol) 50000 UNITS Caps capsule  Commonly known as:  DRISDOL      TAKE these medications       ALPRAZolam 0.5 MG tablet  Commonly known as:  XANAX  Take 0.5 mg by mouth 2 (two) times daily as needed for anxiety.     carvedilol 6.25 MG tablet  Commonly known as:  COREG  Take 6.25 mg by mouth 2 (two) times daily with a meal.     cephALEXin 500 MG capsule  Commonly known as:  KEFLEX  Take 1 capsule (500 mg total) by mouth every 8 (eight) hours.     DSS 100 MG Caps  Take  100 mg by mouth daily.     enoxaparin 40 MG/0.4ML injection  Commonly known as:  LOVENOX  Inject 0.4 mLs (40 mg total) into the skin daily.     HYDROmorphone 2 MG tablet  Commonly known as:  DILAUDID  Take 1-2 tablets (2-4 mg total) by mouth every 2 (two) hours as needed for moderate pain.     losartan 50 MG tablet  Commonly known as:  COZAAR  Take 50 mg by mouth daily.     methocarbamol 500 MG tablet  Commonly known as:  ROBAXIN  Take 1 tablet (500 mg total) by mouth 4 (four) times daily.     simvastatin 20 MG tablet  Commonly known as:  ZOCOR  Take 20 mg by mouth daily.     zolpidem 10 MG tablet  Commonly known as:  AMBIEN  Take 10 mg by mouth at bedtime as needed for sleep (for sleep).         SignedHarlow Mares, Jewelene Mairena M 01/30/2014, 10:57 AM

## 2014-01-30 NOTE — Progress Notes (Signed)
Agree with above.  Loretta Thomas

## 2014-01-30 NOTE — Progress Notes (Signed)
2 Days Post-Op  Subjective: She is going home.  She will follow up with Dr. Harlow Mares next week, I will have the office call her and follow up also.  Objective: Vital signs in last 24 hours: Temp:  [97.7 F (36.5 C)-98.5 F (36.9 C)] 98.5 F (36.9 C) (10/17 0609) Pulse Rate:  [74-83] 83 (10/17 0609) Resp:  [15-16] 16 (10/17 0609) BP: (99-131)/(54-69) 131/69 mmHg (10/17 0609) SpO2:  [94 %-95 %] 95 % (10/17 0609)  420 Po Afebrile, VSS No labs Intake/Output from previous day: 10/16 0701 - 10/17 0700 In: 2171 [P.O.:420; I.V.:1751] Out: 418 [Urine:200; Drains:218] Intake/Output this shift:    General appearance: alert, cooperative, no distress and very sore, and just got dressed. Dr. Harlow Mares has seen her and she just got all her clothes on. Lab Results:  No results found for this basename: WBC, HGB, HCT, PLT,  in the last 72 hours  BMET No results found for this basename: NA, K, CL, CO2, GLUCOSE, BUN, CREATININE, CALCIUM,  in the last 72 hours PT/INR No results found for this basename: LABPROT, INR,  in the last 72 hours  No results found for this basename: AST, ALT, ALKPHOS, BILITOT, PROT, ALBUMIN,  in the last 168 hours   Lipase  No results found for this basename: lipase     Studies/Results: Nm Sentinel Node Inj-no Rpt (breast)  01/28/2014   CLINICAL DATA: Cancer of the right breast   Sulfur colloid was injected intradermally by the nuclear medicine  technologist for breast cancer sentinel node localization.     Medications: . carvedilol  6.25 mg Oral BID WC  .  ceFAZolin (ANCEF) IV  1 g Intravenous 4 times per day  . docusate sodium  100 mg Oral Daily  . heparin subcutaneous  5,000 Units Subcutaneous 3 times per day  . losartan  50 mg Oral Daily  . methocarbamol  500 mg Oral QID  . simvastatin  20 mg Oral Daily    Assessment/Plan Right breast cancer BLUE DYE INJECTION RIGHT BREAST, RIGHT TOTAL MASTECTOMY WITH RIGHT AXILLARY SENTINEL NODE BIOPSY, Dr. Excell Seltzer   Immediate breast reconstruction, right side with tissue expander.   Reconstruction of chest wall due to inadequacy of chest wall muscle  using acellular dermal matrix (AlloDerm), total 32 squared cm.as a distinct procedureDr. Harlow Mares 01/29/14.  Anemia Hypertension Hyperlipidemia   Plan:  Follow up in the office.  I will have them call.      LOS: 2 days    Loretta Thomas 01/30/2014

## 2014-02-09 ENCOUNTER — Ambulatory Visit (HOSPITAL_BASED_OUTPATIENT_CLINIC_OR_DEPARTMENT_OTHER): Payer: BC Managed Care – PPO | Admitting: Hematology and Oncology

## 2014-02-09 ENCOUNTER — Encounter: Payer: Self-pay | Admitting: *Deleted

## 2014-02-09 ENCOUNTER — Telehealth: Payer: Self-pay | Admitting: Hematology and Oncology

## 2014-02-09 VITALS — BP 138/86 | HR 75 | Temp 98.9°F | Resp 18 | Ht 67.0 in | Wt 183.4 lb

## 2014-02-09 DIAGNOSIS — C50911 Malignant neoplasm of unspecified site of right female breast: Secondary | ICD-10-CM

## 2014-02-09 DIAGNOSIS — Z170 Estrogen receptor positive status [ER+]: Secondary | ICD-10-CM

## 2014-02-09 MED ORDER — TRAMADOL HCL 50 MG PO TABS: 50.0000 mg | ORAL_TABLET | Freq: Four times a day (QID) | ORAL | Status: DC | PRN

## 2014-02-09 NOTE — Progress Notes (Signed)
Received order for oncotype dx testing. Requisition sent to pathology. Received by Christy. 

## 2014-02-09 NOTE — Assessment & Plan Note (Signed)
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 MRI of the breasts was reviewed with the patient which showed diffuse extension up to the area large area measuring 4.3 cm. This was biopsied 11/16/2013 came back as DCIS. Status post mastectomy T1b N0 M0 stage IA  Pathology counseling: I discussed with her that the final pathology revealed 0.6 cm invasive ductal carcinoma with extensive DCIS. ER PR positive HER-2 negative. T1 B. N0 M0 stage IA.  Oncotype counseling:I recommended Oncotype DX testing. I explained to the patient that this is a 17 gene panel to evaluate patient tumors DNA and determine using a recurrence score whether she has high risk of intermediate risk of low risk breast cancer. She understands that if her tumor was found to be high risk, she would benefit from systemic chemotherapy. If she is low risk, there would not be any benefit from chemotherapy. If she was found to be intermediate risk, we would need to evaluate the score as well as other risk factors and determine if an abbreviated chemotherapy may be of benefit.  PALB2 and RAD51 Variant: I discussed with her that having PALB2 mutation increases the risk of breast cancer in the contralateral breast. I recommended annual MRIs and mammograms were surveillance.  Severe breast pain: I prescribed her Ultram 50 mg 4 times a day when necessary. Return to clinic in 3 weeks to discuss the results of Oncotype DX.

## 2014-02-09 NOTE — Telephone Encounter (Signed)
m °

## 2014-02-09 NOTE — Progress Notes (Signed)
Patient Care Team: Olga Millers, MD as PCP - General (Obstetrics and Gynecology)  DIAGNOSIS: No matching staging information was found for the patient.  SUMMARY OF ONCOLOGIC HISTORY:   Cancer of right breast   10/19/2013 Mammogram outer right breast, middle third, demonstrate a 6 x 6 x 12 mm group of heterogeneous calcifications   11/10/2013 Breast MRI nonmacerated enhancement in the retroareolar region extending to the nipple with the area of enhancement measuring 4.3 x 2.5 x 3.0 cm   11/12/2013 Initial Diagnosis Invasive ductal carcinoma with DCIS with comedo-type necrosis and calcifications in ER 99% PR 30% Ki-67 40% HER-2 negative ratio 1.14; Grade 3; second biopsy from subareolar area done 11/16/2013 revealed DCIS with calcifications   12/29/2013 Procedure BRCA 1 and 2 Negative: 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 She is here to discuss the final pathology. She is complaining of intense pain in the breast along with swelling which is causing her discomfort. She still has drains in place. She reports that over the past 2 days of pain and swelling of improving.  REVIEW OF SYSTEMS:   Constitutional: Denies fevers, chills or abnormal weight loss Eyes: Denies blurriness of vision Ears, nose, mouth, throat, and face: Denies mucositis or sore throat Respiratory: Denies cough, dyspnea or wheezes Cardiovascular: Denies palpitation, chest discomfort or lower extremity swelling Gastrointestinal:  Denies nausea, heartburn or change in bowel habits Skin: Pain in the right chest wall and axilla Lymphatics: Denies new lymphadenopathy or easy  bruising Neurological:Denies numbness, tingling or new weaknesses Behavioral/Psych: Mood is stable, no new changes  All other systems were reviewed with the patient and are negative.  I have reviewed the past medical history, past surgical history, social history and family history with the patient and they are unchanged from previous note.  ALLERGIES:  is allergic to codeine; demerol; hydrocodone; and lortab.  MEDICATIONS:  Current Outpatient Prescriptions  Medication Sig Dispense Refill  . ALPRAZolam (XANAX) 0.5 MG tablet Take 0.5 mg by mouth 2 (two) times daily as needed for anxiety.      . carvedilol (COREG) 6.25 MG tablet Take 6.25 mg by mouth 2 (two) times daily with a meal.      . cephALEXin (KEFLEX) 500 MG capsule Take 1 capsule (500 mg total) by mouth every 8 (eight) hours.  40 capsule  0  . docusate sodium 100 MG CAPS Take 100 mg by mouth daily.  10 capsule  0  . enoxaparin (LOVENOX) 40 MG/0.4ML injection Inject 0.4 mLs (40 mg total) into the skin daily.  12 Syringe  0  . HYDROmorphone (DILAUDID) 2 MG tablet Take 1-2 tablets (2-4 mg total) by mouth every 2 (two) hours as needed for moderate pain.  40 tablet  0  . losartan (COZAAR) 50 MG tablet Take 50 mg by mouth daily.      . methocarbamol (ROBAXIN) 500 MG tablet Take 1 tablet (500 mg total) by mouth 4 (four) times daily.  40 tablet  1  . nystatin (MYCOSTATIN) 100000 UNIT/ML suspension       . simvastatin (ZOCOR) 20 MG tablet Take 20 mg by mouth daily.      Marland Kitchen zolpidem (AMBIEN) 10 MG tablet Take 10 mg by mouth at bedtime as  needed for sleep (for sleep).      . traMADol (ULTRAM) 50 MG tablet Take 1 tablet (50 mg total) by mouth every 6 (six) hours as needed.  30 tablet  1   No current facility-administered medications for this visit.    PHYSICAL EXAMINATION: ECOG PERFORMANCE STATUS: 2 - Symptomatic, <50% confined to bed  Filed Vitals:   02/09/14 1034  BP: 138/86  Pulse: 75  Temp: 98.9 F (37.2 C)  Resp: 18   Filed  Weights   02/09/14 1034  Weight: 183 lb 6.4 oz (83.19 kg)    GENERAL:alert, no distress and comfortable SKIN: skin color, texture, turgor are normal, no rashes or significant lesions EYES: normal, Conjunctiva are pink and non-injected, sclera clear OROPHARYNX:no exudate, no erythema and lips, buccal mucosa, and tongue normal  NECK: supple, thyroid normal size, non-tender, without nodularity LYMPH:  no palpable lymphadenopathy in the cervical, axillary or inguinal LUNGS: clear to auscultation and percussion with normal breathing effort HEART: regular rate & rhythm and no murmurs and no lower extremity edema ABDOMEN:abdomen soft, non-tender and normal bowel sounds Musculoskeletal:no cyanosis of digits and no clubbing  NEURO: alert & oriented x 3 with fluent speech, no focal motor/sensory deficits LABORATORY DATA:  I have reviewed the data as listed   Chemistry      Component Value Date/Time   NA 145 01/20/2014 1001   K 4.0 01/20/2014 1001   CL 106 01/20/2014 1001   CO2 26 01/20/2014 1001   BUN 9 01/20/2014 1001   CREATININE 0.70 01/20/2014 1001      Component Value Date/Time   CALCIUM 9.3 01/20/2014 1001   ALKPHOS 58 01/20/2014 1001   AST 22 01/20/2014 1001   ALT 10 01/20/2014 1001   BILITOT 0.4 01/20/2014 1001       Lab Results  Component Value Date   WBC 5.7 01/20/2014   HGB 12.7 01/20/2014   HCT 38.1 01/20/2014   MCV 91.6 01/20/2014   PLT 178 01/20/2014   ASSESSMENT & PLAN:  Cancer of right breast 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 MRI of the breasts was reviewed with the patient which showed diffuse extension up to the area large area measuring 4.3 cm. This was biopsied 11/16/2013 came back as DCIS. Status post mastectomy T1b N0 M0 stage IA  Pathology counseling: I discussed with her that the final pathology revealed 0.6 cm invasive ductal carcinoma with extensive DCIS. ER PR positive HER-2  negative. T1 B. N0 M0 stage IA.  Oncotype counseling:I recommended Oncotype DX testing. I explained to the patient that this is a 17 gene panel to evaluate patient tumors DNA and determine using a recurrence score whether she has high risk of intermediate risk of low risk breast cancer. She understands that if her tumor was found to be high risk, she would benefit from systemic chemotherapy. If she is low risk, there would not be any benefit from chemotherapy. If she was found to be intermediate risk, we would need to evaluate the score as well as other risk factors and determine if an abbreviated chemotherapy may be of benefit.  PALB2 and RAD51 Variant: I discussed with her that having PALB2 mutation increases the risk of breast cancer in the contralateral breast. I recommended annual MRIs and mammograms were surveillance.  Severe breast pain: I prescribed her Ultram 50 mg 4 times a day when necessary. Return to clinic in 3 weeks to discuss the results  of Oncotype DX.   No orders of the defined types were placed in this encounter.   The patient has a good understanding of the overall plan. she agrees with it. She will call with any problems that may develop before her next visit here.  I spent 25 minutes counseling the patient face to face. The total time spent in the appointment was 30 minutes and more than 50% was on counseling and review of test results    Rulon Eisenmenger, MD 02/09/2014 11:34 AM

## 2014-02-11 ENCOUNTER — Telehealth: Payer: Self-pay | Admitting: *Deleted

## 2014-02-11 NOTE — Telephone Encounter (Signed)
Received call from [facility] stating that they were unable to reach the patient. Returned call and was informed that patient had called back. Patient is prequalified for 100% financial assistance.

## 2014-03-02 ENCOUNTER — Ambulatory Visit (HOSPITAL_BASED_OUTPATIENT_CLINIC_OR_DEPARTMENT_OTHER): Payer: BC Managed Care – PPO | Admitting: Hematology and Oncology

## 2014-03-02 VITALS — BP 144/86 | HR 89 | Temp 97.9°F | Resp 18 | Ht 67.0 in | Wt 184.5 lb

## 2014-03-02 DIAGNOSIS — C50411 Malignant neoplasm of upper-outer quadrant of right female breast: Secondary | ICD-10-CM

## 2014-03-02 DIAGNOSIS — C50911 Malignant neoplasm of unspecified site of right female breast: Secondary | ICD-10-CM

## 2014-03-02 DIAGNOSIS — N644 Mastodynia: Secondary | ICD-10-CM

## 2014-03-02 DIAGNOSIS — Z170 Estrogen receptor positive status [ER+]: Secondary | ICD-10-CM

## 2014-03-02 NOTE — Assessment & Plan Note (Signed)
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 MRI of the breasts was reviewed with the patient which showed diffuse extension up to the area large area measuring 4.3 cm. This was biopsied 11/16/2013 came back as DCIS. Status post mastectomy T1b N0 M0 stage IA  We're awaiting Oncotype DX testing. I will call and discuss the results with her on the phone and we will try to coordinate her next visit on the day that she will be coming down for followup for the breast.  PALB2 and RAD51 Variant  Severe breast pain: Improved significantly with Ultram.

## 2014-03-02 NOTE — Progress Notes (Signed)
Patient Care Team: Olga Millers, MD as PCP - General (Obstetrics and Gynecology)  DIAGNOSIS: No matching staging information was found for the patient.  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 99% PR 30% Ki-67 40% HER-2 negative ratio 1.14; Grade 3; second biopsy from subareolar area done 11/16/2013 revealed DCIS with calcifications   12/29/2013 Procedure BRCA 1 and 2 Negative: 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 She still has a drain that is still draining serous fluid. Unfortunately we do not yet have the result of Oncotype DX today. It appears that there has been a delay in the processing and has she did not have the test available. She is slightly disappointed that she did not have the results. The breast pain has improved with Ultram. She is not in excruciating pain as she was previously.  REVIEW OF SYSTEMS:   Constitutional: Denies fevers, chills or abnormal weight loss Eyes: Denies blurriness of vision Ears, nose, mouth, throat, and face: Denies mucositis or sore throat Respiratory: Denies cough, dyspnea or wheezes Cardiovascular: Denies palpitation, chest discomfort or lower extremity swelling Gastrointestinal:  Denies nausea,  heartburn or change in bowel habits Skin: Denies abnormal skin rashes Lymphatics: Denies new lymphadenopathy or easy bruising Neurological:Denies numbness, tingling or new weaknesses Behavioral/Psych: Mood is stable, no new changes  Breast: soreness in the right breast from mastectomy as well as the drain  All other systems were reviewed with the patient and are negative.  I have reviewed the past medical history, past surgical history, social history and family history with the patient and they are unchanged from previous note.  ALLERGIES:  is allergic to codeine; demerol; hydrocodone; and lortab.  MEDICATIONS:  Current Outpatient Prescriptions  Medication Sig Dispense Refill  . ALPRAZolam (XANAX) 0.5 MG tablet Take 0.5 mg by mouth 2 (two) times daily as needed for anxiety.    . carvedilol (COREG) 6.25 MG tablet Take 6.25 mg by mouth 2 (two) times daily with a meal.    . cephALEXin (KEFLEX) 500 MG capsule Take 1 capsule (500 mg total) by mouth every 8 (eight) hours. 40 capsule 0  . docusate sodium 100 MG CAPS Take 100 mg by mouth daily. 10 capsule 0  . enoxaparin (LOVENOX) 40 MG/0.4ML injection Inject 0.4 mLs (40 mg total) into the skin daily. 12 Syringe 0  . losartan (COZAAR) 50 MG tablet Take 50 mg by mouth daily.    . simvastatin (ZOCOR) 20 MG tablet Take 20 mg by mouth daily.    . traMADol (ULTRAM) 50 MG tablet Take 1 tablet (50 mg total) by mouth every 6 (six) hours as needed. 30 tablet 1  . zolpidem (AMBIEN) 10 MG tablet Take 10 mg by mouth at bedtime as needed for sleep (for sleep).    Marland Kitchen HYDROmorphone (DILAUDID) 2 MG tablet Take 1-2 tablets (  2-4 mg total) by mouth every 2 (two) hours as needed for moderate pain. 40 tablet 0  . methocarbamol (ROBAXIN) 500 MG tablet Take 1 tablet (500 mg total) by mouth 4 (four) times daily. 40 tablet 1  . nystatin (MYCOSTATIN) 100000 UNIT/ML suspension      No current facility-administered medications for this visit.    PHYSICAL  EXAMINATION: ECOG PERFORMANCE STATUS: 1 - Symptomatic but completely ambulatory  Filed Vitals:   03/02/14 1455  BP: 144/86  Pulse: 89  Temp: 97.9 F (36.6 C)  Resp: 18   Filed Weights   03/02/14 1455  Weight: 184 lb 8 oz (83.689 kg)    GENERAL:alert, no distress and comfortable SKIN: skin color, texture, turgor are normal, no rashes or significant lesions EYES: normal, Conjunctiva are pink and non-injected, sclera clear OROPHARYNX:no exudate, no erythema and lips, buccal mucosa, and tongue normal  NECK: supple, thyroid normal size, non-tender, without nodularity LYMPH:  no palpable lymphadenopathy in the cervical, axillary or inguinal LUNGS: clear to auscultation and percussion with normal breathing effort HEART: regular rate & rhythm and no murmurs and no lower extremity edema ABDOMEN:abdomen soft, non-tender and normal bowel sounds Musculoskeletal:no cyanosis of digits and no clubbing  NEURO: alert & oriented x 3 with fluent speech, no focal motor/sensory deficits  LABORATORY DATA:  I have reviewed the data as listed   Chemistry      Component Value Date/Time   NA 145 01/20/2014 1001   K 4.0 01/20/2014 1001   CL 106 01/20/2014 1001   CO2 26 01/20/2014 1001   BUN 9 01/20/2014 1001   CREATININE 0.70 01/20/2014 1001      Component Value Date/Time   CALCIUM 9.3 01/20/2014 1001   ALKPHOS 58 01/20/2014 1001   AST 22 01/20/2014 1001   ALT 10 01/20/2014 1001   BILITOT 0.4 01/20/2014 1001       Lab Results  Component Value Date   WBC 5.7 01/20/2014   HGB 12.7 01/20/2014   HCT 38.1 01/20/2014   MCV 91.6 01/20/2014   PLT 178 01/20/2014    ASSESSMENT & PLAN:  Cancer of right breast 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 MRI of the breasts was reviewed with the patient which showed diffuse extension up to the area large area measuring 4.3 cm. This was biopsied 11/16/2013 came back  as DCIS. Status post mastectomy T1b N0 M0 stage IA  We're awaiting Oncotype DX testing. I will call and discuss the results with her on the phone and we will try to coordinate her next visit on the day that she will be coming down for followup for the breast.  PALB2 and RAD51 Variant  Severe breast pain: Improved significantly with Ultram.  return to clinic once we have more test results. The patient has a good understanding of the overall plan. she agrees with it. She will call with any problems that may develop before her next visit here.    Rulon Eisenmenger, MD 03/02/2014 4:13 PM

## 2014-03-10 ENCOUNTER — Encounter: Payer: Self-pay | Admitting: *Deleted

## 2014-03-10 ENCOUNTER — Telehealth: Payer: Self-pay | Admitting: Hematology and Oncology

## 2014-03-10 ENCOUNTER — Encounter (HOSPITAL_COMMUNITY): Payer: Self-pay

## 2014-03-10 NOTE — Progress Notes (Signed)
Received Oncotype Dx results of 24.  Gave copy to Dr. Lindi Adie and took a copy to HIM to scan.

## 2014-03-22 ENCOUNTER — Ambulatory Visit (HOSPITAL_BASED_OUTPATIENT_CLINIC_OR_DEPARTMENT_OTHER): Payer: BC Managed Care – PPO | Admitting: Hematology and Oncology

## 2014-03-22 ENCOUNTER — Telehealth: Payer: Self-pay | Admitting: Hematology and Oncology

## 2014-03-22 VITALS — BP 146/83 | HR 75 | Temp 98.2°F | Resp 18 | Ht 67.0 in | Wt 185.2 lb

## 2014-03-22 DIAGNOSIS — Z170 Estrogen receptor positive status [ER+]: Secondary | ICD-10-CM

## 2014-03-22 DIAGNOSIS — C50911 Malignant neoplasm of unspecified site of right female breast: Secondary | ICD-10-CM

## 2014-03-22 DIAGNOSIS — F339 Major depressive disorder, recurrent, unspecified: Secondary | ICD-10-CM

## 2014-03-22 MED ORDER — TRAMADOL HCL 50 MG PO TABS: 50.0000 mg | ORAL_TABLET | Freq: Four times a day (QID) | ORAL | Status: DC | PRN

## 2014-03-22 MED ORDER — ANASTROZOLE 1 MG PO TABS
1.0000 mg | ORAL_TABLET | Freq: Every day | ORAL | Status: DC
Start: 2014-03-22 — End: 2015-03-25

## 2014-03-22 MED ORDER — ANASTROZOLE 1 MG PO TABS: 1.0000 mg | ORAL_TABLET | Freq: Every day | ORAL | Status: DC

## 2014-03-22 MED ORDER — VENLAFAXINE HCL ER 37.5 MG PO CP24: 37.5000 mg | ORAL_CAPSULE | Freq: Every day | ORAL | Status: DC

## 2014-03-22 NOTE — Progress Notes (Signed)
Patient Care Team: Olga Millers, MD as PCP - General (Obstetrics and Gynecology)  DIAGNOSIS: No matching staging information was found for the patient.  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 99% PR 30% Ki-67 40% HER-2 negative ratio 1.14; Grade 3; second biopsy from subareolar area done 11/16/2013 revealed DCIS with calcifications   12/29/2013 Procedure BRCA 1 and 2 Negative: 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 She is undergoing breast reconstruction and his sore from that procedure. She is here today to discuss Oncotype DX score and further adjuvant treatment options. She is very stressed out about her home situation with her son having bipolar disorder and going to her hospitalizations. She is also very worried about her financial situation with  Medical bills.  REVIEW OF SYSTEMS:   Constitutional: Denies fevers, chills or abnormal weight loss Eyes: Denies blurriness of vision Ears, nose, mouth, throat, and face: Denies mucositis or sore  throat Respiratory: Denies cough, dyspnea or wheezes Cardiovascular: Denies palpitation, chest discomfort or lower extremity swelling Gastrointestinal:  Denies nausea, heartburn or change in bowel habits Skin: Denies abnormal skin rashes Lymphatics: Denies new lymphadenopathy or easy bruising Neurological:Denies numbness, tingling or new weaknesses Behavioral/Psych: Mood is stable, no new changes  Breast: right arm and right breast pain All other systems were reviewed with the patient and are negative.  I have reviewed the past medical history, past surgical history, social history and family history with the patient and they are unchanged from previous note.  ALLERGIES:  is allergic to codeine; demerol; hydrocodone; and lortab.  MEDICATIONS:  Current Outpatient Prescriptions  Medication Sig Dispense Refill  . ALPRAZolam (XANAX) 0.5 MG tablet Take 0.5 mg by mouth 2 (two) times daily as needed for anxiety.    . carvedilol (COREG) 6.25 MG tablet Take 6.25 mg by mouth 2 (two) times daily with a meal.    . docusate sodium 100 MG CAPS Take 100 mg by mouth daily. 10 capsule 0  . losartan (COZAAR) 50 MG tablet Take 50 mg by mouth daily.    . methocarbamol (ROBAXIN) 500 MG tablet Take 1 tablet (500 mg total) by mouth 4 (four) times daily. 40 tablet 1  . nystatin (MYCOSTATIN) 100000 UNIT/ML suspension     . simvastatin (ZOCOR) 20 MG tablet Take 20 mg by mouth daily.    . traMADol (ULTRAM) 50 MG tablet Take 1 tablet (50 mg total) by mouth every 6 (six) hours as needed. 60 tablet 1  . zolpidem (AMBIEN) 10 MG tablet Take 10 mg by mouth at bedtime as needed for sleep (for sleep).    Marland Kitchen anastrozole (ARIMIDEX) 1 MG tablet Take 1 tablet (  1 mg total) by mouth daily. 90 tablet 3  . venlafaxine XR (EFFEXOR-XR) 37.5 MG 24 hr capsule Take 1 capsule (37.5 mg total) by mouth daily with breakfast. 30 capsule 6   No current facility-administered medications for this visit.    PHYSICAL EXAMINATION: ECOG  PERFORMANCE STATUS: 1 - Symptomatic but completely ambulatory  Filed Vitals:   03/22/14 1222  BP: 146/83  Pulse: 75  Temp: 98.2 F (36.8 C)  Resp: 18   Filed Weights   03/22/14 1222  Weight: 185 lb 3.2 oz (84.006 kg)    GENERAL:alert, no distress and comfortable SKIN: skin color, texture, turgor are normal, no rashes or significant lesions EYES: normal, Conjunctiva are pink and non-injected, sclera clear OROPHARYNX:no exudate, no erythema and lips, buccal mucosa, and tongue normal  NECK: supple, thyroid normal size, non-tender, without nodularity LYMPH:  no palpable lymphadenopathy in the cervical, axillary or inguinal LUNGS: clear to auscultation and percussion with normal breathing effort HEART: regular rate & rhythm and no murmurs and no lower extremity edema ABDOMEN:abdomen soft, non-tender and normal bowel sounds Musculoskeletal:no cyanosis of digits and no clubbing  NEURO: alert & oriented x 3 with fluent speech, no focal motor/sensory deficits  LABORATORY DATA:  I have reviewed the data as listed   Chemistry      Component Value Date/Time   NA 145 01/20/2014 1001   K 4.0 01/20/2014 1001   CL 106 01/20/2014 1001   CO2 26 01/20/2014 1001   BUN 9 01/20/2014 1001   CREATININE 0.70 01/20/2014 1001      Component Value Date/Time   CALCIUM 9.3 01/20/2014 1001   ALKPHOS 58 01/20/2014 1001   AST 22 01/20/2014 1001   ALT 10 01/20/2014 1001   BILITOT 0.4 01/20/2014 1001       Lab Results  Component Value Date   WBC 5.7 01/20/2014   HGB 12.7 01/20/2014   HCT 38.1 01/20/2014   MCV 91.6 01/20/2014   PLT 178 01/20/2014    ASSESSMENT & PLAN:  Cancer of right breast 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 MRI of the breasts was reviewed with the patient which showed diffuse extension up to the area large area measuring 4.3 cm. This was biopsied 11/16/2013 came back as DCIS. Status  post mastectomy T1b N0 M0 stage IA, Oncotype DX recurrence score is 24, 16% risk of recurrence intermediate risk  Oncotype DX report: I discussed Oncotype DX recurrence score report in great detail providing them with the data as suggest that patients with intermediate risk of recurrence do not have the same degree of benefit as high risk patients. There is potentially 3-4% absolute benefit of survival. I discussed the risks and benefits of adjuvant chemotherapy and we elected to not doing systemic chemotherapy. Patient will be treated with antiestrogen therapy with Arimidex 1 mg daily  Aromatase inhibitor counseling:We discussed the risks and benefits of anti-estrogen therapy with aromatase inhibitors. These include but not limited to insomnia, hot flashes, mood changes, vaginal dryness, bone density loss, and weight gain. Although rare, serious side effects including endometrial cancer, risk of blood clots were also discussed. We strongly believe that the benefits far outweigh the risks. Patient understands these risks and consented to starting treatment. Planned treatment duration is 5 years (unless extended adjuvant therapy shows benefit with aromatase inhibitors).  Major Depression: I will prescribe Effexor XR 37.5 mg daily to help with her symptoms this will also help with the  hot flashes related to aromatase inhibitors.  PALB2 and RAD51 Variant: risk of breast and pancreatic cancers  Severe breast pain: Improved significantly with Ultram. I renewed it today. Patient may return to work January 4 Return to clinic in one month to evaluate toxicities of treatment.    No orders of the defined types were placed in this encounter.   The patient has a good understanding of the overall plan. she agrees with it. She will call with any problems that may develop before her next visit here.   Rulon Eisenmenger, MD 03/22/2014 1:09 PM

## 2014-03-22 NOTE — Telephone Encounter (Signed)
, °

## 2014-03-22 NOTE — Assessment & Plan Note (Addendum)
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 MRI of the breasts was reviewed with the patient which showed diffuse extension up to the area large area measuring 4.3 cm. This was biopsied 11/16/2013 came back as DCIS. Status post mastectomy T1b N0 M0 stage IA, Oncotype DX recurrence score is 24, 16% risk of recurrence intermediate risk  Oncotype DX report: I discussed Oncotype DX recurrence score report in great detail providing them with the data as suggest that patients with intermediate risk of recurrence do not have the same degree of benefit as high risk patients. There is potentially 3-4% absolute benefit of survival. I discussed the risks and benefits of adjuvant chemotherapy and we elected to not doing systemic chemotherapy. Patient will be treated with antiestrogen therapy with Arimidex 1 mg daily  Aromatase inhibitor counseling:We discussed the risks and benefits of anti-estrogen therapy with aromatase inhibitors. These include but not limited to insomnia, hot flashes, mood changes, vaginal dryness, bone density loss, and weight gain. Although rare, serious side effects including endometrial cancer, risk of blood clots were also discussed. We strongly believe that the benefits far outweigh the risks. Patient understands these risks and consented to starting treatment. Planned treatment duration is 5 years (unless extended adjuvant therapy shows benefit with aromatase inhibitors).  Major Depression: I will prescribe Effexor XR 37.5 mg daily to help with her symptoms this will also help with the hot flashes related to aromatase inhibitors.  PALB2 and RAD51 Variant: risk of breast and pancreatic cancers  Severe breast pain: Improved significantly with Ultram. I renewed it today. Patient may return to work January 4 Return to clinic in one month to evaluate toxicities of treatment.

## 2014-03-25 ENCOUNTER — Telehealth: Payer: Self-pay | Admitting: *Deleted

## 2014-03-25 NOTE — Telephone Encounter (Signed)
Patient called with question about taking Calcium with vitamin D as she already takes 50,000 units of vitamin D weekly. Also wanted to know if bone density can be scheduled in [place]. Left message with patient that she does not need to take Calcium with vitamin D, just her weekly dose. Also that we would schedule the bone density at [facility].

## 2014-03-30 ENCOUNTER — Other Ambulatory Visit: Payer: Self-pay

## 2014-03-30 DIAGNOSIS — C50911 Malignant neoplasm of unspecified site of right female breast: Secondary | ICD-10-CM

## 2014-03-30 NOTE — Progress Notes (Signed)
Pt left msg notifying that appt had been made for bone density and requesting order be sent.    Signed order for bone density sent to [facility].  Sent to scan.

## 2014-04-08 ENCOUNTER — Encounter: Payer: Self-pay | Admitting: *Deleted

## 2014-04-08 NOTE — Progress Notes (Signed)
Received bone density report from [place] to scan.

## 2014-05-03 ENCOUNTER — Telehealth: Payer: Self-pay | Admitting: Hematology and Oncology

## 2014-05-03 NOTE — Telephone Encounter (Signed)
s.w. pt and advised on 3.7 time change due to MD on call.Marland KitchenMarland KitchenMarland KitchenMarland Kitchenpt ok and aware

## 2014-05-25 ENCOUNTER — Telehealth: Payer: Self-pay

## 2014-05-25 NOTE — Telephone Encounter (Signed)
Returned pt call.  Pt reports she rcvd tramadol prescription from her PCP - no refill from [place] needed at this time.   Ensure pt that one a day vitamin is ok to take.  Pt voiced understanding.

## 2014-06-21 ENCOUNTER — Ambulatory Visit: Payer: Self-pay

## 2014-06-21 ENCOUNTER — Telehealth: Payer: Self-pay | Admitting: Hematology and Oncology

## 2014-06-21 ENCOUNTER — Ambulatory Visit (HOSPITAL_BASED_OUTPATIENT_CLINIC_OR_DEPARTMENT_OTHER): Payer: BC Managed Care – PPO | Admitting: Hematology and Oncology

## 2014-06-21 VITALS — BP 134/85 | HR 74 | Temp 98.4°F | Resp 18 | Ht 67.0 in | Wt 185.8 lb

## 2014-06-21 DIAGNOSIS — C50911 Malignant neoplasm of unspecified site of right female breast: Secondary | ICD-10-CM

## 2014-06-21 DIAGNOSIS — Z170 Estrogen receptor positive status [ER+]: Secondary | ICD-10-CM

## 2014-06-21 DIAGNOSIS — F339 Major depressive disorder, recurrent, unspecified: Secondary | ICD-10-CM

## 2014-06-21 DIAGNOSIS — N644 Mastodynia: Secondary | ICD-10-CM

## 2014-06-21 LAB — CBC WITH DIFFERENTIAL/PLATELET
BASO%: 0.3 % (ref 0.0–2.0)
BASOS ABS: 0 10*3/uL (ref 0.0–0.1)
EOS ABS: 0 10*3/uL (ref 0.0–0.5)
EOS%: 0.7 % (ref 0.0–7.0)
HCT: 36.6 % (ref 34.8–46.6)
HGB: 12 g/dL (ref 11.6–15.9)
LYMPH#: 1.3 10*3/uL (ref 0.9–3.3)
LYMPH%: 18.9 % (ref 14.0–49.7)
MCH: 29.9 pg (ref 25.1–34.0)
MCHC: 32.8 g/dL (ref 31.5–36.0)
MCV: 91 fL (ref 79.5–101.0)
MONO#: 0.4 10*3/uL (ref 0.1–0.9)
MONO%: 5.5 % (ref 0.0–14.0)
NEUT%: 74.6 % (ref 38.4–76.8)
NEUTROS ABS: 5.3 10*3/uL (ref 1.5–6.5)
PLATELETS: 173 10*3/uL (ref 145–400)
RBC: 4.02 10*6/uL (ref 3.70–5.45)
RDW: 13.6 % (ref 11.2–14.5)
WBC: 7.1 10*3/uL (ref 3.9–10.3)

## 2014-06-21 LAB — COMPREHENSIVE METABOLIC PANEL (CC13)
ALBUMIN: 4.1 g/dL (ref 3.5–5.0)
ALK PHOS: 55 U/L (ref 40–150)
ALT: <6 U/L (ref 0–55)
AST: 18 U/L (ref 5–34)
Anion Gap: 10 mEq/L (ref 3–11)
BUN: 9.7 mg/dL (ref 7.0–26.0)
CHLORIDE: 104 meq/L (ref 98–109)
CO2: 27 mEq/L (ref 22–29)
Calcium: 9.3 mg/dL (ref 8.4–10.4)
Creatinine: 0.8 mg/dL (ref 0.6–1.1)
EGFR: 83 mL/min/{1.73_m2} — AB (ref >90)
GLUCOSE: 99 mg/dL (ref 70–140)
POTASSIUM: 4.2 meq/L (ref 3.5–5.1)
Sodium: 141 mEq/L (ref 136–145)
Total Bilirubin: 0.51 mg/dL (ref 0.20–1.20)
Total Protein: 7.1 g/dL (ref 6.4–8.3)

## 2014-06-21 LAB — MAGNESIUM (CC13): Magnesium: 2.7 mg/dl — ABNORMAL HIGH (ref 1.5–2.5)

## 2014-06-21 LAB — TSH CHCC: TSH: 2.125 m(IU)/L (ref 0.308–3.960)

## 2014-06-21 NOTE — Progress Notes (Signed)
Patient Care Team: Olga Millers, MD as PCP - General (Obstetrics and Gynecology)  DIAGNOSIS: No matching staging information was found for the patient.  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 99% PR 30% Ki-67 40% HER-2 negative ratio 1.14; Grade 3; second biopsy from subareolar area done 11/16/2013 revealed DCIS with calcifications   12/29/2013 Procedure BRCA 1 and 2 Negative: 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 She had mental fogginess and depression and the symptoms improved with Effexor. She was feeling back to her baseline without the past few days she had an episode of diarrhea as well as sinus congestion and cough. Denies any fevers or chills.   patient will be undergoing breast reconstruction surgery soon.  She is going back to work currently. Gets occasional hot flashes  REVIEW OF SYSTEMS:   Constitutional: Denies fevers, chills or abnormal weight loss Eyes: Denies blurriness of vision Ears, nose,  mouth, throat, and face: Denies mucositis or sore throat Respiratory: Denies cough, dyspnea or wheezes Cardiovascular: Denies palpitation, chest discomfort or lower extremity swelling Gastrointestinal:  Denies nausea, heartburn or change in bowel habits Skin: Denies abnormal skin rashes Lymphatics: Denies new lymphadenopathy or easy bruising Neurological:Denies numbness, tingling or new weaknesses Behavioral/Psych: Mood is stable, no new changes  Breast:  Breast pain is much improved with tramadol All other systems were reviewed with the patient and are negative.  I have reviewed the past medical history, past surgical history, social history and family history with the patient and they are unchanged from previous note.  ALLERGIES:  is allergic to codeine; demerol; hydrocodone; and lortab.  MEDICATIONS:  Current Outpatient Prescriptions  Medication Sig Dispense Refill  . ALPRAZolam (XANAX) 0.5 MG tablet Take 0.5 mg by mouth 2 (two) times daily as needed for anxiety.    Marland Kitchen anastrozole (ARIMIDEX) 1 MG tablet Take 1 tablet (1 mg total) by mouth daily. 90 tablet 3  . carvedilol (COREG) 6.25 MG tablet Take 6.25 mg by mouth 2 (two) times daily with a meal.    . docusate sodium 100 MG CAPS Take 100 mg by mouth daily. 10 capsule 0  . losartan (COZAAR) 50 MG tablet Take 50 mg by mouth daily.    Marland Kitchen nystatin (MYCOSTATIN) 100000 UNIT/ML suspension     . simvastatin (ZOCOR) 20 MG tablet Take 20 mg by mouth daily.    . traMADol (ULTRAM) 50 MG tablet Take 1 tablet (50 mg total) by mouth every 6 (six) hours as needed. 60 tablet 1  . venlafaxine XR (EFFEXOR-XR) 37.5 MG 24 hr capsule Take 1 capsule (37.5 mg total) by mouth daily  with breakfast. 30 capsule 6  . zolpidem (AMBIEN) 10 MG tablet Take 10 mg by mouth at bedtime as needed for sleep (for sleep).     No current facility-administered medications for this visit.    PHYSICAL EXAMINATION: ECOG PERFORMANCE STATUS: 1 - Symptomatic but completely  ambulatory  Filed Vitals:   06/21/14 0921  BP: 134/85  Pulse: 74  Temp: 98.4 F (36.9 C)  Resp: 18   Filed Weights   06/21/14 0921  Weight: 185 lb 12.8 oz (84.278 kg)    GENERAL:alert, no distress and comfortable SKIN: skin color, texture, turgor are normal, no rashes or significant lesions EYES: normal, Conjunctiva are pink and non-injected, sclera clear OROPHARYNX:no exudate, no erythema and lips, buccal mucosa, and tongue normal  NECK: supple, thyroid normal size, non-tender, without nodularity LYMPH:  no palpable lymphadenopathy in the cervical, axillary or inguinal LUNGS: clear to auscultation and percussion with normal breathing effort HEART: regular rate & rhythm and no murmurs and no lower extremity edema ABDOMEN:abdomen soft, non-tender and normal bowel sounds Musculoskeletal:no cyanosis of digits and no clubbing  NEURO: alert & oriented x 3 with fluent speech, no focal motor/sensory deficits  LABORATORY DATA:  I have reviewed the data as listed   Chemistry      Component Value Date/Time   NA 145 01/20/2014 1001   K 4.0 01/20/2014 1001   CL 106 01/20/2014 1001   CO2 26 01/20/2014 1001   BUN 9 01/20/2014 1001   CREATININE 0.70 01/20/2014 1001      Component Value Date/Time   CALCIUM 9.3 01/20/2014 1001   ALKPHOS 58 01/20/2014 1001   AST 22 01/20/2014 1001   ALT 10 01/20/2014 1001   BILITOT 0.4 01/20/2014 1001       Lab Results  Component Value Date   WBC 5.7 01/20/2014   HGB 12.7 01/20/2014   HCT 38.1 01/20/2014   MCV 91.6 01/20/2014   PLT 178 01/20/2014   ASSESSMENT & PLAN:  Cancer of right breast 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 MRI of the breasts was reviewed with the patient which showed diffuse extension up to the area large area measuring 4.3 cm. This was biopsied 11/16/2013 came back as DCIS. Status post mastectomy T1b N0 M0 stage IA, Oncotype DX  recurrence score is 24, 16% risk of recurrence intermediate risk  PALB2 and RAD51 Variant: risk of breast and pancreatic cancers  Current treatment: Anastrozole 1 mg daily started 03/22/2014 Anastrozole toxicities: Major depression: I prescribed her Effexor and it appears to be helping her with the symptoms as well as hot flashes. Breast pain: Improved with Ultram Sinus drainage and ear fullness: no sign of infection I recommended over-the-counter Claritin-D or Sudafed Return to clinic in 3 months for follow-up    Orders Placed This Encounter  Procedures  . CBC with Differential    Standing Status: Future     Number of Occurrences: 1     Standing Expiration Date: 06/21/2015  . Comprehensive metabolic panel (Cmet) - [facility]    Standing Status: Future     Number of Occurrences: 1     Standing Expiration Date: 06/21/2015  . Lipid panel  . Vit D  25 hydroxy (rtn osteoporosis monitoring)  . Hemoglobin A1C  . TSH  . CBC with Differential/Platelet  . Magnesium  . Comprehensive metabolic panel   The patient has a good understanding of the overall plan. she agrees with it. She will  call with any problems that may develop before her next visit here.   Rulon Eisenmenger, MD   \

## 2014-06-21 NOTE — Assessment & Plan Note (Signed)
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 MRI of the breasts was reviewed with the patient which showed diffuse extension up to the area large area measuring 4.3 cm. This was biopsied 11/16/2013 came back as DCIS. Status post mastectomy T1b N0 M0 stage IA, Oncotype DX recurrence score is 24, 16% risk of recurrence intermediate risk  PALB2 and RAD51 Variant: risk of breast and pancreatic cancers  Current treatment: Anastrozole 1 mg daily started 03/22/2014 Anastrozole toxicities: Major depression: I prescribed her Effexor and it appears to be helping her with the symptoms as well as hot flashes. Breast pain: Improved with Ultram Return to clinic in 6 months for follow-up

## 2014-06-21 NOTE — Telephone Encounter (Signed)
appts made and avs printed for pt  Loretta Thomas °

## 2014-06-22 LAB — LIPID PANEL
CHOLESTEROL: 153 mg/dL (ref 0–200)
HDL: 46 mg/dL (ref >=46)
LDL Cholesterol: 77 mg/dL (ref 0–99)
TRIGLYCERIDES: 150 mg/dL — AB (ref <150)
Total CHOL/HDL Ratio: 3.3 Ratio
VLDL: 30 mg/dL (ref 0–40)

## 2014-06-22 LAB — HEMOGLOBIN A1C
Hgb A1c MFr Bld: 5.5 % (ref <5.7)
Mean Plasma Glucose: 111 mg/dL (ref <117)

## 2014-06-22 LAB — VITAMIN D 25 HYDROXY (VIT D DEFICIENCY, FRACTURES): Vit D, 25-Hydroxy: 40 ng/mL (ref 30–100)

## 2014-07-13 ENCOUNTER — Other Ambulatory Visit: Payer: Self-pay | Admitting: Plastic Surgery

## 2014-08-27 ENCOUNTER — Telehealth: Payer: Self-pay | Admitting: *Deleted

## 2014-08-27 NOTE — Telephone Encounter (Signed)
TC from patient regarding a rash she is experiencing after her breast implant/reconstruction and breast reduction.  She is having a rash around the breast that had the reduction, red, bumpy but no drainage. She had also developed a blister under that breast that was debrided by her surgeon this week.  She states the surgeon told her thought it was a yeast infection and prescribed Fluconazole. Patient wanted to make sure that medicine was ok for her to take. Reviewed medications and allergies. Ok for her to take and instructed keep affected areas clean and dry( patient was using a lot of cornstarch on affected areas)  and to keep f/u appt with plastic surgeon next week. Patient voiced understanding.

## 2014-10-05 ENCOUNTER — Ambulatory Visit (HOSPITAL_BASED_OUTPATIENT_CLINIC_OR_DEPARTMENT_OTHER): Payer: BLUE CROSS/BLUE SHIELD | Admitting: Hematology and Oncology

## 2014-10-05 ENCOUNTER — Telehealth: Payer: Self-pay | Admitting: Hematology and Oncology

## 2014-10-05 VITALS — BP 131/77 | HR 80 | Temp 97.4°F | Resp 18 | Ht 67.0 in | Wt 187.7 lb

## 2014-10-05 DIAGNOSIS — F329 Major depressive disorder, single episode, unspecified: Secondary | ICD-10-CM

## 2014-10-05 DIAGNOSIS — N644 Mastodynia: Secondary | ICD-10-CM | POA: Diagnosis not present

## 2014-10-05 DIAGNOSIS — C50911 Malignant neoplasm of unspecified site of right female breast: Secondary | ICD-10-CM

## 2014-10-05 DIAGNOSIS — C50011 Malignant neoplasm of nipple and areola, right female breast: Secondary | ICD-10-CM | POA: Diagnosis not present

## 2014-10-05 NOTE — Telephone Encounter (Signed)
Gave avs & calendar for December. °

## 2014-10-05 NOTE — Progress Notes (Signed)
Patient Care Team: Olga Millers, MD as PCP - General (Obstetrics and Gynecology)  DIAGNOSIS: No matching staging information was found for the patient.  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 99% PR 30% Ki-67 40% HER-2 negative ratio 1.14; Grade 3; second biopsy from subareolar area done 11/16/2013 revealed DCIS with calcifications   12/29/2013 Procedure BRCA 1 and 2 Negative: 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 She is doing well without any problems or concerns. She has some ear fullness and postnasal drip. Denies any fevers or chills. In March 2016 patient had infection of the left breast reconstruction area which was treated with anti-fungal medications and ointments and it finally improved.  REVIEW OF SYSTEMS:   Constitutional: Denies fevers, chills or abnormal weight loss Eyes: Denies blurriness of vision Ears, nose, mouth, throat, and face: Ear fullness and occasional dizziness and postnasal drip Respiratory: Denies cough,  dyspnea or wheezes Cardiovascular: Denies palpitation, chest discomfort or lower extremity swelling Gastrointestinal:  Denies nausea, heartburn or change in bowel habits Skin: Denies abnormal skin rashes Lymphatics: Denies new lymphadenopathy or easy bruising Neurological:Denies numbness, tingling or new weaknesses Behavioral/Psych: Mood is stable, no new changes   All other systems were reviewed with the patient and are negative.  I have reviewed the past medical history, past surgical history, social history and family history with the patient and they are unchanged from previous note.  ALLERGIES:  is allergic to codeine; demerol; hydrocodone; and lortab.  MEDICATIONS:  Current Outpatient Prescriptions  Medication Sig Dispense Refill  . ALPRAZolam (XANAX) 0.5 MG tablet Take 0.5 mg by mouth 2 (two) times daily as needed for anxiety.    Marland Kitchen anastrozole (ARIMIDEX) 1 MG tablet Take 1 tablet (1 mg total) by mouth daily. 90 tablet 3  . calcium carbonate (OS-CAL) 600 MG TABS tablet Take 600 mg by mouth 2 (two) times daily with a meal.    . carvedilol (COREG) 6.25 MG tablet Take 6.25 mg by mouth 2 (two) times daily with a meal.    . docusate sodium 100 MG CAPS Take 100 mg by mouth daily. 10 capsule 0  . losartan (COZAAR) 50 MG tablet Take 50 mg by mouth daily.    . Multiple Vitamin (MULTIVITAMIN) tablet Take 1 tablet by mouth daily.    . simvastatin (ZOCOR) 20 MG tablet Take 20 mg by mouth daily.    . traMADol (ULTRAM) 50 MG tablet Take 1 tablet (50 mg total) by mouth every 6 (six) hours as needed. 60 tablet 1  . venlafaxine XR (EFFEXOR-XR) 37.5 MG 24 hr capsule Take 1 capsule (37.5 mg total) by mouth daily with breakfast. 30  capsule 6  . Vitamin D, Ergocalciferol, (DRISDOL) 50000 UNITS CAPS capsule Take 50,000 Units by mouth every 7 (seven) days.    Marland Kitchen zolpidem (AMBIEN) 10 MG tablet Take 10 mg by mouth at bedtime as needed for sleep (for sleep).     No current facility-administered medications  for this visit.    PHYSICAL EXAMINATION: ECOG PERFORMANCE STATUS: 1 - Symptomatic but completely ambulatory  Filed Vitals:   10/05/14 1512  BP: 131/77  Pulse: 80  Temp: 97.4 F (36.3 C)  Resp: 18   Filed Weights   10/05/14 1512  Weight: 187 lb 11.2 oz (85.14 kg)    GENERAL:alert, no distress and comfortable SKIN: skin color, texture, turgor are normal, no rashes or significant lesions EYES: normal, Conjunctiva are pink and non-injected, sclera clear OROPHARYNX:no exudate, no erythema and lips, buccal mucosa, and tongue normal , fluid behind the year both sides NECK: supple, thyroid normal size, non-tender, without nodularity LYMPH:  no palpable lymphadenopathy in the cervical, axillary or inguinal LUNGS: clear to auscultation and percussion with normal breathing effort HEART: regular rate & rhythm and no murmurs and no lower extremity edema ABDOMEN:abdomen soft, non-tender and normal bowel sounds Musculoskeletal:no cyanosis of digits and no clubbing  NEURO: alert & oriented x 3 with fluent speech, no focal motor/sensory deficits   LABORATORY DATA:  I have reviewed the data as listed   Chemistry      Component Value Date/Time   NA 141 06/21/2014 1035   NA 145 01/20/2014 1001   K 4.2 06/21/2014 1035   K 4.0 01/20/2014 1001   CL 106 01/20/2014 1001   CO2 27 06/21/2014 1035   CO2 26 01/20/2014 1001   BUN 9.7 06/21/2014 1035   BUN 9 01/20/2014 1001   CREATININE 0.8 06/21/2014 1035   CREATININE 0.70 01/20/2014 1001      Component Value Date/Time   CALCIUM 9.3 06/21/2014 1035   CALCIUM 9.3 01/20/2014 1001   ALKPHOS 55 06/21/2014 1035   ALKPHOS 58 01/20/2014 1001   AST 18 06/21/2014 1035   AST 22 01/20/2014 1001   ALT <6 06/21/2014 1035   ALT 10 01/20/2014 1001   BILITOT 0.51 06/21/2014 1035   BILITOT 0.4 01/20/2014 1001       Lab Results  Component Value Date   WBC 7.1 06/21/2014   HGB 12.0 06/21/2014   HCT 36.6 06/21/2014   MCV 91.0 06/21/2014   PLT 173  06/21/2014   NEUTROABS 5.3 06/21/2014    ASSESSMENT & PLAN:  Cancer of right breast 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 MRI of the breasts was reviewed with the patient which showed diffuse extension up to the area large area measuring 4.3 cm. This was biopsied 11/16/2013 came back as DCIS. Status post mastectomy T1b N0 M0 stage IA, Oncotype DX recurrence score is 24, 16% risk of recurrence intermediate risk  PALB2 and RAD51 Variant: risk of breast and pancreatic cancers  Current treatment: Anastrozole 1 mg daily started 03/22/2014 5 years Anastrozole toxicities: Major depression: Much improved with Effexor. Breast pain: Currently undergoing breast reconstruction. In March she had a major problem with the reconstruction area getting infection which was treated and now much improved.  Sinus drainage and ear fullness: no sign of infection I recommended over-the-counter Claritin-D. Return to clinic in 6 months for follow-up  No orders of the defined types were placed in this encounter.   The patient has a good understanding  of the overall plan. she agrees with it. she will call with any problems that may develop before the next visit here.   Delaine Hernandez K, MD      

## 2014-10-05 NOTE — Assessment & Plan Note (Signed)
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 MRI of the breasts was reviewed with the patient which showed diffuse extension up to the area large area measuring 4.3 cm. This was biopsied 11/16/2013 came back as DCIS. Status post mastectomy T1b N0 M0 stage IA, Oncotype DX recurrence score is 24, 16% risk of recurrence intermediate risk  PALB2 and RAD51 Variant: risk of breast and pancreatic cancers  Current treatment: Anastrozole 1 mg daily started 03/22/2014 5 years Anastrozole toxicities: Major depression: I prescribed her Effexor and it appears to be helping her with the symptoms as well as hot flashes. Breast pain: Improved with Ultram Sinus drainage and ear fullness: no sign of infection I recommended over-the-counter Claritin-D or Sudafed Return to clinic in 6 months for follow-up

## 2014-10-19 ENCOUNTER — Other Ambulatory Visit: Payer: Self-pay | Admitting: Obstetrics and Gynecology

## 2014-10-20 LAB — CYTOLOGY - PAP

## 2014-10-26 ENCOUNTER — Telehealth: Payer: Self-pay | Admitting: *Deleted

## 2014-10-26 ENCOUNTER — Other Ambulatory Visit: Payer: Self-pay

## 2014-10-26 DIAGNOSIS — C50911 Malignant neoplasm of unspecified site of right female breast: Secondary | ICD-10-CM

## 2014-10-26 MED ORDER — VENLAFAXINE HCL ER 37.5 MG PO CP24: 37.5000 mg | ORAL_CAPSULE | Freq: Every day | ORAL | Status: DC

## 2014-10-26 NOTE — Telephone Encounter (Signed)
Patient called requesting refill on effexor.  "My second problem is I'm tired, weak with no energy.  My GYN lab shows my HGB = 11.9 and it should = 12.  I used to receive Vitamin B12 shots for years but did not refill my prescription because I take Arimidex.  I need to know what Dr. Lindi Adie thinks about the thyroid, iron and vitamin B12 lab results.  My general practitioner Dr. Neita Garnet in [place], [place] has said I'm pre-diabetic at times."   Twenty minute discussion with patient about surgeries and energy level since surgery in May.  Who was instructed to fax [place] labs results to [facility] For Dr. Lindi Adie to review.  Also informed her the vitamin B12 injections need to be discussed with her PCP.  We will call her if any orders from Dr. Lindi Adie.

## 2014-10-27 ENCOUNTER — Telehealth: Payer: Self-pay

## 2014-10-27 NOTE — Telephone Encounter (Signed)
Labs rcvd from Patient dtd 10/20/14 [facility].  Reviewed by Dr. Lindi Adie, Sent to scan.

## 2014-10-28 NOTE — Telephone Encounter (Signed)
Pharmacy not [place].  Refill called in by this nurse on 10-26-2014.  Labs viewed by Dr. Lindi Adie and no new orders.

## 2014-10-29 NOTE — Telephone Encounter (Signed)
Effexor refill faxed to pharmacy.  Sent to scan.

## 2014-12-23 ENCOUNTER — Encounter: Payer: Self-pay | Admitting: Genetic Counselor

## 2014-12-23 DIAGNOSIS — Z1379 Encounter for other screening for genetic and chromosomal anomalies: Secondary | ICD-10-CM | POA: Insufficient documentation

## 2015-02-11 ENCOUNTER — Telehealth: Payer: Self-pay | Admitting: *Deleted

## 2015-02-11 NOTE — Telephone Encounter (Signed)
Patient called wanting to discuss an "issue". Called and left message to call back to clinic.

## 2015-03-01 ENCOUNTER — Other Ambulatory Visit: Payer: Self-pay | Admitting: General Surgery

## 2015-03-02 ENCOUNTER — Other Ambulatory Visit: Payer: Self-pay | Admitting: General Surgery

## 2015-03-08 ENCOUNTER — Other Ambulatory Visit: Payer: Self-pay

## 2015-03-08 DIAGNOSIS — Z1231 Encounter for screening mammogram for malignant neoplasm of breast: Secondary | ICD-10-CM

## 2015-03-25 ENCOUNTER — Other Ambulatory Visit: Payer: Self-pay | Admitting: *Deleted

## 2015-03-25 DIAGNOSIS — C50911 Malignant neoplasm of unspecified site of right female breast: Secondary | ICD-10-CM

## 2015-03-25 MED ORDER — ANASTROZOLE 1 MG PO TABS: 1.0000 mg | ORAL_TABLET | Freq: Every day | ORAL | Status: DC

## 2015-04-04 ENCOUNTER — Encounter: Payer: Self-pay | Admitting: Hematology and Oncology

## 2015-04-04 ENCOUNTER — Telehealth: Payer: Self-pay | Admitting: Hematology and Oncology

## 2015-04-04 ENCOUNTER — Ambulatory Visit (HOSPITAL_BASED_OUTPATIENT_CLINIC_OR_DEPARTMENT_OTHER): Payer: BLUE CROSS/BLUE SHIELD | Admitting: Hematology and Oncology

## 2015-04-04 VITALS — BP 134/92 | HR 86 | Temp 98.0°F | Resp 18 | Ht 67.0 in | Wt 193.6 lb

## 2015-04-04 DIAGNOSIS — R635 Abnormal weight gain: Secondary | ICD-10-CM

## 2015-04-04 DIAGNOSIS — M791 Myalgia: Secondary | ICD-10-CM

## 2015-04-04 DIAGNOSIS — N644 Mastodynia: Secondary | ICD-10-CM

## 2015-04-04 DIAGNOSIS — C50411 Malignant neoplasm of upper-outer quadrant of right female breast: Secondary | ICD-10-CM | POA: Diagnosis not present

## 2015-04-04 DIAGNOSIS — F329 Major depressive disorder, single episode, unspecified: Secondary | ICD-10-CM

## 2015-04-04 NOTE — Assessment & Plan Note (Signed)
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 MRI of the breasts was reviewed with the patient which showed diffuse extension up to the area large area measuring 4.3 cm. This was biopsied 11/16/2013 came back as DCIS. Status post mastectomy T1b N0 M0 stage IA, Oncotype DX recurrence score is 24, 16% risk of recurrence intermediate risk  PALB2 and RAD51 Variant: risk of breast and pancreatic cancers NCCN guidelines now recommend annual MRI screening on a prophylactic mastectomy for PALB2 I discussed this new guideline change with the patient. Current treatment: Anastrozole 1 mg daily started 03/22/2014 5 years Anastrozole toxicities:  Major depression: Much improved with Effexor. Breast pain: Currently undergoing breast reconstruction. In March 2016 she had a major problem with the reconstruction area getting infection which was treated and now much improved.  Return to clinic in 6 months for follow-up  

## 2015-04-04 NOTE — Progress Notes (Signed)
Patient Care Team: Olga Millers, MD as PCP - General (Obstetrics and Gynecology) Rossie Muskrat, DO as Referring Physician (Family Medicine)  DIAGNOSIS: No matching staging information was found for the patient.  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% PR 30% Ki-67 40% HER-2 negative ratio 1.14; Grade 3; second biopsy from subareolar area done 11/16/2013 revealed DCIS with calcifications   12/29/2013 Procedure BRCA 1 and 2 Negative: 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 She started developing diffuse muscle aches and pains which have gotten worse over time. It is making it extremely difficult for her to do anything after she gets back home from full day's work. She has not been exercising and is complaining of gaining weight.  REVIEW OF SYSTEMS:   Constitutional: Denies fevers, chills or abnormal weight loss Eyes: Denies blurriness of vision Ears, nose, mouth, throat, and face:  Denies mucositis or sore throat Respiratory: Denies cough, dyspnea or wheezes Cardiovascular: Denies palpitation, chest discomfort or lower extremity swelling Gastrointestinal:  Denies nausea, heartburn or change in bowel habits Skin: Denies abnormal skin rashes Lymphatics: Denies new lymphadenopathy or easy bruising Neurological:generalized fatigue and weakness and musculoskeletal aches and pains Behavioral/Psych: Mood is stable, no new changes  Breast:  denies any pain or lumps or nodules in either breasts All other systems were reviewed with the patient and are negative.  I have reviewed the past medical history, past surgical history, social history and family history with the patient and they are unchanged from previous note.  ALLERGIES:  is allergic to codeine; demerol; hydrocodone; and lortab.  MEDICATIONS:  Current Outpatient Prescriptions  Medication Sig Dispense Refill  . ALPRAZolam (XANAX) 0.5 MG tablet Take 0.5 mg by mouth 2 (two) times daily as needed for anxiety.    Marland Kitchen anastrozole (ARIMIDEX) 1 MG tablet Take 1 tablet (1 mg total) by mouth daily. 90 tablet 3  . calcium carbonate (OS-CAL) 600 MG TABS tablet Take 600 mg by mouth 2 (two) times daily with a meal.    . carvedilol (COREG) 6.25 MG tablet Take 6.25 mg by mouth 2 (two) times daily with a meal.    . docusate sodium 100 MG CAPS Take 100 mg by mouth daily. 10 capsule 0  . losartan (COZAAR) 50 MG tablet Take 50 mg by mouth daily.    . Multiple Vitamin (MULTIVITAMIN) tablet Take 1 tablet by mouth daily.    . simvastatin (ZOCOR) 20 MG tablet Take 20 mg by mouth daily.    . traMADol (ULTRAM) 50 MG tablet Take 1 tablet (50 mg total) by mouth every 6 (six) hours  as needed. 60 tablet 1  . venlafaxine XR (EFFEXOR-XR) 37.5 MG 24 hr capsule Take 1 capsule (37.5 mg total) by mouth daily with breakfast. 30 capsule 6  . Vitamin D, Ergocalciferol, (DRISDOL) 50000 UNITS CAPS capsule Take 50,000 Units by mouth every 7 (seven) days.    Marland Kitchen  zolpidem (AMBIEN) 10 MG tablet Take 10 mg by mouth at bedtime as needed for sleep (for sleep).     No current facility-administered medications for this visit.    PHYSICAL EXAMINATION: ECOG PERFORMANCE STATUS: 1 - Symptomatic but completely ambulatory  Filed Vitals:   04/04/15 1447  BP: 134/92  Pulse: 86  Temp: 98 F (36.7 C)  Resp: 18   Filed Weights   04/04/15 1447  Weight: 193 lb 9.6 oz (87.816 kg)    GENERAL:alert, no distress and comfortable SKIN: skin color, texture, turgor are normal, no rashes or significant lesions EYES: normal, Conjunctiva are pink and non-injected, sclera clear OROPHARYNX:no exudate, no erythema and lips, buccal mucosa, and tongue normal  NECK: supple, thyroid normal size, non-tender, without nodularity LYMPH:  no palpable lymphadenopathy in the cervical, axillary or inguinal LUNGS: clear to auscultation and percussion with normal breathing effort HEART: regular rate & rhythm and no murmurs and no lower extremity edema ABDOMEN:abdomen soft, non-tender and normal bowel sounds Musculoskeletal:no cyanosis of digits and no clubbing  NEURO: alert & oriented x 3 with fluent speech, no focal motor/sensory deficits  LABORATORY DATA:  I have reviewed the data as listed   Chemistry      Component Value Date/Time   NA 141 06/21/2014 1035   NA 145 01/20/2014 1001   K 4.2 06/21/2014 1035   K 4.0 01/20/2014 1001   CL 106 01/20/2014 1001   CO2 27 06/21/2014 1035   CO2 26 01/20/2014 1001   BUN 9.7 06/21/2014 1035   BUN 9 01/20/2014 1001   CREATININE 0.8 06/21/2014 1035   CREATININE 0.70 01/20/2014 1001      Component Value Date/Time   CALCIUM 9.3 06/21/2014 1035   CALCIUM 9.3 01/20/2014 1001   ALKPHOS 55 06/21/2014 1035   ALKPHOS 58 01/20/2014 1001   AST 18 06/21/2014 1035   AST 22 01/20/2014 1001   ALT <6 06/21/2014 1035   ALT 10 01/20/2014 1001   BILITOT 0.51 06/21/2014 1035   BILITOT 0.4 01/20/2014 1001       Lab Results  Component  Value Date   WBC 7.1 06/21/2014   HGB 12.0 06/21/2014   HCT 36.6 06/21/2014   MCV 91.0 06/21/2014   PLT 173 06/21/2014   NEUTROABS 5.3 06/21/2014   ASSESSMENT & PLAN:  Breast cancer of upper-outer quadrant of right female breast ([place]) 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 MRI of the breasts was reviewed with the patient which showed diffuse extension up to the area large area measuring 4.3 cm. This was biopsied 11/16/2013 came back as DCIS. 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 We discussed the new [place] and guidelines NCCN guidelines now recommend annual MRI screening on a prophylactic mastectomy for PALB2 I discussed this new guideline change with the patient. I recommended getting a breast MRI.  Current treatment: Anastrozole 1 mg daily started 03/22/2014 5 years Anastrozole toxicities: 1.severe myalgias and arthralgias 2. Weight gain Because of these symptoms, I encouraged her  to hold off on anastrozole for a couple of weeks and then start taking half a tablet daily for the next 3 months until March 2017. In the interim, if her symptoms from arthritis and arthralgias get better, she can start to exercise more often and lose weight and stay stronger. We hope to resume full dose starting March 2017  Major depression: Much improved with Effexor. Breast pain: undergone breast reconstruction.  Return to clinic in 3 months for follow-up    Orders Placed This Encounter  Procedures  . MR Breast Left W Wo Contrast    Standing Status: Future     Number of Occurrences:      Standing Expiration Date: 06/04/2016    Order Specific Question:  If indicated for the ordered procedure, I authorize the administration of contrast media per Radiology protocol    Answer:   Yes    Order Specific Question:  Reason for Exam (SYMPTOM  OR DIAGNOSIS REQUIRED)    Answer:  PALB2 and RAD 51 gene mutations High risk breast cancer    Order Specific Question:  Preferred imaging location?    Answer:  [id number]    Order Specific Question:  Does the patient have a pacemaker or implanted devices?    Answer:  No    Order Specific Question:  What is the patient's sedation requirement?    Answer:  No Sedation   The patient has a good understanding of the overall plan. she agrees with it. she will call with any problems that may develop before the next visit here.   Rulon Eisenmenger, MD 04/04/2015

## 2015-04-04 NOTE — Telephone Encounter (Signed)
Appointments made and avs printed for patient,she will call for her mri

## 2015-04-05 ENCOUNTER — Other Ambulatory Visit: Payer: Self-pay | Admitting: *Deleted

## 2015-04-05 ENCOUNTER — Telehealth: Payer: Self-pay

## 2015-04-05 DIAGNOSIS — C50411 Malignant neoplasm of upper-outer quadrant of right female breast: Secondary | ICD-10-CM

## 2015-04-05 NOTE — Telephone Encounter (Signed)
Order faxed to [facility]

## 2015-04-05 NOTE — Telephone Encounter (Signed)
Loretta [facility] called [facility] wanting to set up an appt for PT. [facility] from [facility] [place], called [facility] wanting to get an order from Dr Loretta Thomas. [facility] fax is [phone].

## 2015-04-14 ENCOUNTER — Encounter: Payer: Self-pay | Admitting: *Deleted

## 2015-04-14 NOTE — Progress Notes (Signed)
Received referral confirmation from [facility] physical therapy, reviewed by Dr. Lindi Adie, sent to scan.

## 2015-04-15 ENCOUNTER — Ambulatory Visit
Admission: RE | Admit: 2015-04-15 | Discharge: 2015-04-15 | Disposition: A | Discharge status: Home / Self Care | Payer: BLUE CROSS/BLUE SHIELD | Source: Ambulatory Visit

## 2015-04-15 DIAGNOSIS — Z1231 Encounter for screening mammogram for malignant neoplasm of breast: Secondary | ICD-10-CM

## 2015-04-26 ENCOUNTER — Other Ambulatory Visit: Payer: BLUE CROSS/BLUE SHIELD

## 2015-05-04 ENCOUNTER — Ambulatory Visit
Admission: RE | Admit: 2015-05-04 | Discharge: 2015-05-04 | Disposition: A | Discharge status: Home / Self Care | Payer: BLUE CROSS/BLUE SHIELD | Source: Ambulatory Visit | Attending: Hematology and Oncology | Admitting: Hematology and Oncology

## 2015-05-04 ENCOUNTER — Other Ambulatory Visit: Payer: Self-pay | Admitting: Hematology and Oncology

## 2015-05-04 DIAGNOSIS — C50411 Malignant neoplasm of upper-outer quadrant of right female breast: Secondary | ICD-10-CM

## 2015-05-04 MED ORDER — GADOBENATE DIMEGLUMINE 529 MG/ML IV SOLN
18.0000 mL | Freq: Once | INTRAVENOUS | Status: AC | PRN
  Administered 2015-05-04: 18 mL via INTRAVENOUS

## 2015-05-17 ENCOUNTER — Other Ambulatory Visit: Payer: Self-pay

## 2015-05-17 DIAGNOSIS — C50411 Malignant neoplasm of upper-outer quadrant of right female breast: Secondary | ICD-10-CM

## 2015-05-17 MED ORDER — VENLAFAXINE HCL ER 37.5 MG PO CP24: 37.5000 mg | ORAL_CAPSULE | Freq: Every day | ORAL | Status: DC

## 2015-07-07 ENCOUNTER — Ambulatory Visit (HOSPITAL_BASED_OUTPATIENT_CLINIC_OR_DEPARTMENT_OTHER): Payer: BLUE CROSS/BLUE SHIELD | Admitting: Hematology and Oncology

## 2015-07-07 ENCOUNTER — Telehealth: Payer: Self-pay | Admitting: Hematology and Oncology

## 2015-07-07 ENCOUNTER — Encounter: Payer: Self-pay | Admitting: Hematology and Oncology

## 2015-07-07 VITALS — BP 117/73 | HR 81 | Temp 98.2°F | Resp 18 | Wt 195.8 lb

## 2015-07-07 DIAGNOSIS — F329 Major depressive disorder, single episode, unspecified: Secondary | ICD-10-CM

## 2015-07-07 DIAGNOSIS — N644 Mastodynia: Secondary | ICD-10-CM

## 2015-07-07 DIAGNOSIS — F325 Major depressive disorder, single episode, in full remission: Secondary | ICD-10-CM | POA: Insufficient documentation

## 2015-07-07 DIAGNOSIS — C50411 Malignant neoplasm of upper-outer quadrant of right female breast: Secondary | ICD-10-CM

## 2015-07-07 DIAGNOSIS — R635 Abnormal weight gain: Secondary | ICD-10-CM

## 2015-07-07 DIAGNOSIS — M791 Myalgia: Secondary | ICD-10-CM

## 2015-07-07 NOTE — Assessment & Plan Note (Signed)
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 MRI of the breasts was reviewed with the patient which showed diffuse extension up to the area large area measuring 4.3 cm. This was biopsied 11/16/2013 came back as DCIS. 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 We discussed the new NCCN guidelines NCCN guidelines now recommend annual MRI screening OR a bilateral prophylactic mastectomy for PALB2 After discussing these options, patient shows. Surveillance with annual breast MRIs   Current treatment: Anastrozole 1 mg daily started 03/22/2014 5 years  Anastrozole toxicities: 1.severe myalgias and arthralgias 2. Weight gain Because of these symptoms, I encouraged her to hold off on anastrozole for a couple of weeks and then start taking half a tablet daily for the next 3 months until March 2017. In the interim, if her symptoms from arthritis and arthralgias get better, she can start to exercise more often and lose weight and stay stronger. We hope to resume full dose starting March 2017  Major depression: Much improved with Effexor. Breast pain: undergone breast reconstruction.  Return to clinic in 6 months for follow-up

## 2015-07-07 NOTE — Progress Notes (Signed)
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 in ER 99% PR 30% Ki-67 40% HER-2 negative ratio 1.14; Grade 3; second biopsy from subareolar area done 11/16/2013 revealed DCIS with calcifications   12/29/2013 Procedure BRCA 1 and 2 Negative: 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 She had profound arthralgias and myalgias to the point that she was unable to function. We discontinued anastrozole for a few weeks and then resumed it at half dose. She has been doing much better since then. She is also undergone physical therapy in the interim and it appears that it has significantly improved her range of motion in her shoulders. Her depression is under excellent control and she has major plans to enjoy the summer by going on medications. She has noticed about 10 pound weight gain since the last time. It  is primarily because she has not been eating right.  REVIEW OF SYSTEMS:   Constitutional: Denies fevers, chills or abnormal weight loss, 10 pound weight gain  Eyes: Denies blurriness of vision Ears, nose, mouth, throat, and face: Denies mucositis or sore throat Respiratory: Still recovering from sinus infections Cardiovascular: Denies palpitation, chest discomfort Gastrointestinal:  Denies nausea, heartburn or change in bowel habits Skin: Denies abnormal skin rashes Lymphatics: Denies new lymphadenopathy or easy bruising Neurological:Denies numbness, tingling or new weaknesses Behavioral/Psych: Mood is stable, no new changes  Extremities: No lower extremity edema Breast:  denies any pain or lumps or nodules in either breasts All other systems were reviewed with the patient and are negative.  I have reviewed the past medical history, past surgical history, social history and family history with the patient and they are unchanged from previous note.  ALLERGIES:  is allergic to codeine; demerol; hydrocodone; and lortab.  MEDICATIONS:  Current Outpatient Prescriptions  Medication Sig Dispense Refill  . ALPRAZolam (XANAX) 0.5 MG tablet Take 0.5 mg by mouth 2 (two) times daily as needed for anxiety.    Marland Kitchen anastrozole (ARIMIDEX) 1 MG tablet Take 1 tablet (1 mg total) by mouth daily. 90 tablet 3  . calcium carbonate (OS-CAL) 600 MG TABS tablet Take 600 mg by mouth 2 (two) times daily with a meal.    . carvedilol (COREG) 6.25 MG tablet Take 6.25 mg by mouth 2 (two) times daily with a meal.    . docusate sodium 100 MG CAPS Take 100 mg by mouth daily. 10 capsule 0  . losartan (COZAAR) 50 MG tablet Take 50 mg by mouth daily.    . Multiple Vitamin (  MULTIVITAMIN) tablet Take 1 tablet by mouth daily.    . simvastatin (ZOCOR) 20 MG tablet Take 20 mg by mouth daily.    . traMADol (ULTRAM) 50 MG tablet Take 1 tablet (50 mg total) by mouth every 6 (six) hours as needed. 60 tablet 1  . venlafaxine XR  (EFFEXOR-XR) 37.5 MG 24 hr capsule Take 1 capsule (37.5 mg total) by mouth daily with breakfast. 30 capsule 6  . Vitamin D, Ergocalciferol, (DRISDOL) 50000 UNITS CAPS capsule Take 50,000 Units by mouth every 7 (seven) days.    Marland Kitchen zolpidem (AMBIEN) 10 MG tablet Take 10 mg by mouth at bedtime as needed for sleep (for sleep).     No current facility-administered medications for this visit.    PHYSICAL EXAMINATION: ECOG PERFORMANCE STATUS: 1 - Symptomatic but completely ambulatory  Filed Vitals:   07/07/15 1518  BP: 117/73  Pulse: 81  Temp: 98.2 F (36.8 C)  Resp: 18   Filed Weights   07/07/15 1518  Weight: 195 lb 12.8 oz (88.814 kg)    GENERAL:alert, no distress and comfortable SKIN: skin color, texture, turgor are normal, no rashes or significant lesions EYES: normal, Conjunctiva are pink and non-injected, sclera clear OROPHARYNX:no exudate, no erythema and lips, buccal mucosa, and tongue normal  NECK: supple, thyroid normal size, non-tender, without nodularity LYMPH:  no palpable lymphadenopathy in the cervical, axillary or inguinal LUNGS: clear to auscultation and percussion with normal breathing effort HEART: regular rate & rhythm and no murmurs and no lower extremity edema ABDOMEN:abdomen soft, non-tender and normal bowel sounds MUSCULOSKELETAL:no cyanosis of digits and no clubbing  NEURO: alert & oriented x 3 with fluent speech, no focal motor/sensory deficits EXTREMITIES: No lower extremity edema, muscle stiffness and achiness   LABORATORY DATA:  I have reviewed the data as listed   Chemistry      Component Value Date/Time   NA 141 06/21/2014 1035   NA 145 01/20/2014 1001   K 4.2 06/21/2014 1035   K 4.0 01/20/2014 1001   CL 106 01/20/2014 1001   CO2 27 06/21/2014 1035   CO2 26 01/20/2014 1001   BUN 9.7 06/21/2014 1035   BUN 9 01/20/2014 1001   CREATININE 0.8 06/21/2014 1035   CREATININE 0.70 01/20/2014 1001      Component Value Date/Time   CALCIUM 9.3  06/21/2014 1035   CALCIUM 9.3 01/20/2014 1001   ALKPHOS 55 06/21/2014 1035   ALKPHOS 58 01/20/2014 1001   AST 18 06/21/2014 1035   AST 22 01/20/2014 1001   ALT <6 06/21/2014 1035   ALT 10 01/20/2014 1001   BILITOT 0.51 06/21/2014 1035   BILITOT 0.4 01/20/2014 1001       Lab Results  Component Value Date   WBC 7.1 06/21/2014   HGB 12.0 06/21/2014   HCT 36.6 06/21/2014   MCV 91.0 06/21/2014   PLT 173 06/21/2014   NEUTROABS 5.3 06/21/2014     ASSESSMENT & PLAN:  Breast cancer of upper-outer quadrant of right female breast ([place]) 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 MRI of the breasts was reviewed with the patient which showed diffuse extension up to the area large area measuring 4.3 cm. This was biopsied 11/16/2013 came back as DCIS. 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 We discussed the new NCCN guidelines  NCCN guidelines now recommend annual MRI screening OR a bilateral prophylactic mastectomy for PALB2 After discussing these options, patient shows. Surveillance with annual breast MRIs   Current treatment: Anastrozole 1 mg daily started 03/22/2014 5 years; takes half a tablet daily since December 2016 due to musculoskeletal pains  Anastrozole toxicities: 1.severe myalgias and arthralgias: Much improved since she cut the dosage in half 2. 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  I encouraged her to resume full dose anastrozole after she comes back from her beach vacation.  Major depression: Much improved with Effexor. Breast pain: undergone breast  reconstruction.  Return to clinic in 6 months for follow-up   No orders of the defined types were placed in this encounter.   The patient has a good understanding of the overall plan. she agrees with it. she will call with any problems that may develop before the next visit here.   Rulon Eisenmenger, MD 07/07/2015

## 2015-07-07 NOTE — Telephone Encounter (Signed)
appt made and avs printed °

## 2015-07-07 NOTE — Progress Notes (Signed)
Unable to get in to exam room prior to MD.  No assessment performed.  

## 2015-08-01 ENCOUNTER — Encounter: Payer: Self-pay | Admitting: *Deleted

## 2015-08-01 NOTE — Progress Notes (Signed)
Received office notes from [facility], reviewed by Dr. Marquette Saa, sent to scan.

## 2015-10-20 ENCOUNTER — Other Ambulatory Visit: Payer: Self-pay | Admitting: Obstetrics and Gynecology

## 2015-10-21 LAB — CYTOLOGY - PAP

## 2016-01-12 ENCOUNTER — Other Ambulatory Visit: Payer: Self-pay | Admitting: Hematology and Oncology

## 2016-01-12 ENCOUNTER — Ambulatory Visit (HOSPITAL_BASED_OUTPATIENT_CLINIC_OR_DEPARTMENT_OTHER): Payer: BLUE CROSS/BLUE SHIELD | Admitting: Hematology and Oncology

## 2016-01-12 ENCOUNTER — Encounter: Payer: Self-pay | Admitting: Hematology and Oncology

## 2016-01-12 DIAGNOSIS — D510 Vitamin B12 deficiency anemia due to intrinsic factor deficiency: Secondary | ICD-10-CM | POA: Insufficient documentation

## 2016-01-12 DIAGNOSIS — M791 Myalgia: Secondary | ICD-10-CM

## 2016-01-12 DIAGNOSIS — N644 Mastodynia: Secondary | ICD-10-CM | POA: Diagnosis not present

## 2016-01-12 DIAGNOSIS — F329 Major depressive disorder, single episode, unspecified: Secondary | ICD-10-CM

## 2016-01-12 DIAGNOSIS — C50411 Malignant neoplasm of upper-outer quadrant of right female breast: Secondary | ICD-10-CM

## 2016-01-12 DIAGNOSIS — Z9011 Acquired absence of right breast and nipple: Secondary | ICD-10-CM

## 2016-01-12 DIAGNOSIS — Z1231 Encounter for screening mammogram for malignant neoplasm of breast: Secondary | ICD-10-CM

## 2016-01-12 MED ORDER — CYANOCOBALAMIN 1000 MCG/ML IJ SOLN: 1000.0000 ug | INTRAMUSCULAR | 0 refills | Status: DC

## 2016-01-12 NOTE — Assessment & Plan Note (Signed)
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 MRI of the breasts was reviewed with the patient which showed diffuse extension up to the area large area measuring 4.3 cm. This was biopsied 11/16/2013 came back as DCIS. 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  NCCN guidelines now recommend annual MRI screening OR a bilateral prophylactic mastectomy for PALB2 After discussing these options, patient preferred Surveillance with annual breast MRIs   Current treatment: Anastrozole 1 mg daily started 03/22/2014 5 years; takes half a tablet daily since December 2016 due to musculoskeletal pains, increased back to full dose from May 2017  Anastrozole toxicities: 1.severe myalgias and arthralgias: Much improved since she cut the dosage in half 2. 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  Major depression: Much improved with Effexor. Breast pain: undergone breast reconstruction.  Breast Cancer Surveillance: 1. Breast exam 01/12/2016: Benign 2. Mammogram 04/19/2015 No abnormalities. Postsurgical changes. Breast Density Category B. I recommended that she get 3-D mammograms for surveillance. 3. Breast MRI 05/04/2015: No abnormal enhancement, we will plan to do breast MRIs etc. months after the mammogram in the future.  Return to clinic in 6 months for follow-up

## 2016-01-12 NOTE — Progress Notes (Signed)
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 in ER 99% PR 30% Ki-67 40% HER-2 negative ratio 1.14; Grade 3; second biopsy from subareolar area done 11/16/2013 revealed DCIS with calcifications      12/29/2013 Procedure    BRCA 1 and 2 Negative: 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 She took only half dose of anastrozole for a while and when she tolerated that well she will increase the dosage to 1 full tablet. Because of this she does have tendinitis and muscle and joint stiffness and achiness.  REVIEW OF SYSTEMS:   Constitutional: Denies fevers, chills or abnormal weight loss Eyes: Denies blurriness of vision Ears, nose, mouth, throat, and face: Denies mucositis or sore throat Respiratory: Denies cough,  dyspnea or wheezes Cardiovascular: Denies palpitation, chest discomfort Gastrointestinal:  Denies nausea, heartburn or change in bowel habits Skin: Denies abnormal skin rashes Lymphatics: Denies new lymphadenopathy or easy bruising Neurological:Denies numbness, tingling or new weaknesses Behavioral/Psych: Mood is stable, no new changes  Extremities: No lower extremity edema  All other systems were reviewed with the patient and are negative.  I have reviewed the past medical history, past surgical history, social history and family history with the patient and they are unchanged from previous note.  ALLERGIES:  is allergic to codeine; demerol [meperidine]; hydrocodone; and lortab [hydrocodone-acetaminophen].  MEDICATIONS:  Current Outpatient Prescriptions  Medication Sig Dispense Refill  . ALPRAZolam (XANAX) 0.5 MG tablet Take 0.5 mg by mouth 2 (two) times daily as needed for anxiety.    Marland Kitchen anastrozole (ARIMIDEX) 1 MG tablet Take 1 tablet (1 mg total) by mouth daily. 90 tablet 3  . calcium carbonate (OS-CAL) 600 MG TABS tablet Take 600 mg by mouth 2 (two) times daily with a meal.    . carvedilol (COREG) 6.25 MG tablet Take 6.25 mg by mouth 2 (two) times daily with a meal.    . docusate sodium 100 MG CAPS Take 100 mg by mouth daily. 10 capsule 0  . losartan (COZAAR) 50 MG tablet Take 50 mg by mouth daily.    . Multiple Vitamin (MULTIVITAMIN) tablet Take 1 tablet by mouth daily.    . simvastatin (ZOCOR) 20 MG tablet Take 20 mg by mouth daily.    . traMADol (ULTRAM) 50 MG tablet Take 1  tablet (50 mg total) by mouth every 6 (six) hours as needed. 60 tablet 1  . venlafaxine XR (EFFEXOR-XR) 37.5 MG 24 hr capsule Take 1 capsule (37.5 mg total) by mouth daily with breakfast. 30 capsule 6  . Vitamin D, Ergocalciferol, (DRISDOL) 50000 UNITS CAPS capsule Take 50,000 Units by mouth every 7 (seven) days.    Marland Kitchen zolpidem (AMBIEN) 10 MG tablet Take 10 mg by mouth at bedtime as needed for sleep (for sleep).      No current facility-administered medications for this visit.     PHYSICAL EXAMINATION: ECOG PERFORMANCE STATUS: 1 - Symptomatic but completely ambulatory  Vitals:   01/12/16 1452  BP: 128/76  Pulse: 90  Resp: 18  Temp: 98 F (36.7 C)   Filed Weights   01/12/16 1452  Weight: 199 lb 9.6 oz (90.5 kg)    GENERAL:alert, no distress and comfortable SKIN: skin color, texture, turgor are normal, no rashes or significant lesions EYES: normal, Conjunctiva are pink and non-injected, sclera clear OROPHARYNX:no exudate, no erythema and lips, buccal mucosa, and tongue normal  NECK: supple, thyroid normal size, non-tender, without nodularity LYMPH:  no palpable lymphadenopathy in the cervical, axillary or inguinal LUNGS: clear to auscultation and percussion with normal breathing effort HEART: regular rate & rhythm and no murmurs and no lower extremity edema ABDOMEN:abdomen soft, non-tender and normal bowel sounds MUSCULOSKELETAL:no cyanosis of digits and no clubbing  NEURO: alert & oriented x 3 with fluent speech, no focal motor/sensory deficits EXTREMITIES: No lower extremity edema  LABORATORY DATA:  I have reviewed the data as listed   Chemistry      Component Value Date/Time   NA 141 06/21/2014 1035   K 4.2 06/21/2014 1035   CL 106 01/20/2014 1001   CO2 27 06/21/2014 1035   BUN 9.7 06/21/2014 1035   CREATININE 0.8 06/21/2014 1035      Component Value Date/Time   CALCIUM 9.3 06/21/2014 1035   ALKPHOS 55 06/21/2014 1035   AST 18 06/21/2014 1035   ALT <6 06/21/2014 1035   BILITOT 0.51 06/21/2014 1035       Lab Results  Component Value Date   WBC 7.1 06/21/2014   HGB 12.0 06/21/2014   HCT 36.6 06/21/2014   MCV 91.0 06/21/2014   PLT 173 06/21/2014   NEUTROABS 5.3 06/21/2014     ASSESSMENT & PLAN:  Breast cancer of upper-outer quadrant of right female breast ([facility]) 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 MRI of the breasts was reviewed with the patient which showed diffuse extension up to the area large area measuring 4.3 cm. This was biopsied 11/16/2013 came back as DCIS. 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  NCCN guidelines now recommend annual MRI screening OR a bilateral prophylactic mastectomy for PALB2 After discussing these options, patient preferred Surveillance with annual breast MRIs   Current treatment: Anastrozole 1 mg daily started 03/22/2014 5 years; takes half a tablet daily since December 2016 due to musculoskeletal pains, increased back to full dose from May 2017  Anastrozole toxicities: 1.severe myalgias and arthralgias: Much improved since she cut the dosage in half 2. 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  Major depression: Much improved with Effexor. Breast pain: undergone breast reconstruction.  Breast Cancer Surveillance: 1. Breast exam 01/12/2016: Benign 2. Mammogram 04/19/2015 No abnormalities. Postsurgical changes. Breast Density Category B. I recommended that she get 3-D mammograms for surveillance. 3. Breast MRI 05/04/2015: No abnormal enhancement.  Patient will undergo breast MRI at the end of the year and will push the mammogram to June 2018. Pernicious anemia: I recommended that she get monthly B-12 injections. I sent a prescription for a 25 mL multidose vial of B-12. Return to clinic in 6 months for follow-up   No orders of the defined types were placed in this encounter.  The patient has a good understanding of the overall plan. she agrees with it. she will call with any problems that may  develop before the next visit here.   Rulon Eisenmenger, MD 01/12/16

## 2016-01-13 NOTE — Progress Notes (Signed)
Rx for Vit B injection faxed to [facility]

## 2016-01-27 ENCOUNTER — Other Ambulatory Visit: Payer: Self-pay

## 2016-01-27 DIAGNOSIS — C50411 Malignant neoplasm of upper-outer quadrant of right female breast: Secondary | ICD-10-CM

## 2016-01-27 MED ORDER — VENLAFAXINE HCL ER 37.5 MG PO CP24: 37.5000 mg | ORAL_CAPSULE | Freq: Every day | ORAL | 5 refills | Status: DC

## 2016-04-17 ENCOUNTER — Ambulatory Visit
Admission: RE | Admit: 2016-04-17 | Discharge: 2016-04-17 | Disposition: A | Discharge status: Home / Self Care | Payer: BLUE CROSS/BLUE SHIELD | Source: Ambulatory Visit | Attending: Hematology and Oncology | Admitting: Hematology and Oncology

## 2016-04-17 DIAGNOSIS — Z1231 Encounter for screening mammogram for malignant neoplasm of breast: Secondary | ICD-10-CM

## 2016-04-17 DIAGNOSIS — Z9011 Acquired absence of right breast and nipple: Secondary | ICD-10-CM

## 2016-04-25 ENCOUNTER — Other Ambulatory Visit: Payer: Self-pay | Admitting: Emergency Medicine

## 2016-05-15 ENCOUNTER — Other Ambulatory Visit: Payer: Self-pay | Admitting: Emergency Medicine

## 2016-05-28 ENCOUNTER — Other Ambulatory Visit: Payer: Self-pay | Admitting: Emergency Medicine

## 2016-05-28 MED ORDER — ANASTROZOLE 1 MG PO TABS: 1.0000 mg | ORAL_TABLET | Freq: Every day | ORAL | 3 refills | Status: DC

## 2016-06-24 ENCOUNTER — Telehealth: Payer: Self-pay

## 2016-06-24 NOTE — Telephone Encounter (Signed)
called and left a message with a new appt due to 3/22 cme  Loretta Thomas

## 2016-07-05 ENCOUNTER — Ambulatory Visit: Payer: BLUE CROSS/BLUE SHIELD | Admitting: Hematology and Oncology

## 2016-07-09 ENCOUNTER — Ambulatory Visit (HOSPITAL_BASED_OUTPATIENT_CLINIC_OR_DEPARTMENT_OTHER): Payer: BLUE CROSS/BLUE SHIELD | Admitting: Hematology and Oncology

## 2016-07-09 ENCOUNTER — Encounter: Payer: Self-pay | Admitting: Hematology and Oncology

## 2016-07-09 ENCOUNTER — Encounter (INDEPENDENT_AMBULATORY_CARE_PROVIDER_SITE_OTHER): Payer: Self-pay

## 2016-07-09 VITALS — BP 125/73 | HR 91 | Temp 98.2°F | Resp 18 | Wt 201.3 lb

## 2016-07-09 DIAGNOSIS — Z170 Estrogen receptor positive status [ER+]: Secondary | ICD-10-CM

## 2016-07-09 DIAGNOSIS — C50919 Malignant neoplasm of unspecified site of unspecified female breast: Secondary | ICD-10-CM

## 2016-07-09 DIAGNOSIS — F329 Major depressive disorder, single episode, unspecified: Secondary | ICD-10-CM

## 2016-07-09 DIAGNOSIS — M791 Myalgia: Secondary | ICD-10-CM

## 2016-07-09 DIAGNOSIS — Z1589 Genetic susceptibility to other disease: Principal | ICD-10-CM

## 2016-07-09 DIAGNOSIS — C50411 Malignant neoplasm of upper-outer quadrant of right female breast: Secondary | ICD-10-CM | POA: Diagnosis not present

## 2016-07-09 DIAGNOSIS — Z1502 Genetic susceptibility to malignant neoplasm of ovary: Principal | ICD-10-CM

## 2016-07-09 DIAGNOSIS — N644 Mastodynia: Secondary | ICD-10-CM

## 2016-07-09 DIAGNOSIS — Z1509 Genetic susceptibility to other malignant neoplasm: Principal | ICD-10-CM

## 2016-07-09 MED ORDER — LETROZOLE 2.5 MG PO TABS: 2.5000 mg | ORAL_TABLET | Freq: Every day | ORAL | 3 refills | Status: DC

## 2016-07-09 NOTE — Assessment & Plan Note (Signed)
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 MRI of the breasts was reviewed with the patient which showed diffuse extension up to the area large area measuring 4.3 cm. This was biopsied 11/16/2013 came back as DCIS. 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  NCCN guidelines now recommend annual MRI screening OR a bilateral prophylactic mastectomy for PALB2 After discussing these options, patient preferred Surveillance with annual breast MRIs   Current treatment: Anastrozole 1 mg daily started 03/22/2014 5 years; takes half a tablet daily since December 2016 due to musculoskeletal pains, increased back to full dose from May 2017  Anastrozole toxicities: 1.severe myalgias and arthralgias: Much improved since she cut the dosage in half 2. 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  Major depression: Much improved with Effexor. Breast pain: undergone breast reconstruction.  Breast Cancer Surveillance: 1. Breast exam 07/09/2016: Benign 2. Mammogram 04/17/2016  No abnormalities. Postsurgical changes. Breast Density Category B. I recommended that she get 3-D mammograms for surveillance. 3. Breast MRI 05/04/2015: No abnormal enhancement.  Pernicious anemia: I recommended that she get monthly B-12 injections.  Return to clinic in 1 year for follow-up

## 2016-07-09 NOTE — Progress Notes (Signed)
Patient Care Team: Olga Millers, MD as PCP - General (Obstetrics and Gynecology) Rossie Muskrat, DO as Referring Physician (Family Medicine)  DIAGNOSIS:  Encounter Diagnosis  Name Primary?  . Malignant neoplasm of upper-outer quadrant of right breast in female, estrogen receptor positive ([place])     SUMMARY OF ONCOLOGIC HISTORY:Shortness of his blood work for [employer]   Breast cancer of upper-outer quadrant of right female breast ([place])   10/19/2013 Mammogram    outer right breast, middle third, demonstrate a 6 x 6 x 12 mm group of heterogeneous calcifications      11/10/2013 Breast MRI    nonmacerated enhancement in the retroareolar region extending to the nipple with the area of enhancement measuring 4.3 x 2.5 x 3.0 cm      11/12/2013 Initial Diagnosis    Right mastectomy: Invasive ductal carcinoma with DCIS with comedo-type necrosis and calcifications in ER 99% PR 30% Ki-67 40% HER-2 negative ratio 1.14; Grade 3; second biopsy from subareolar area done 11/16/2013 revealed DCIS with calcifications      12/29/2013 Procedure    BRCA 1 and 2 Negative: 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 (32-58% lifetime risk of breast cancer and increased lifetime risk of pancreatic cancer):RAD51D variant of unknown significance: c.[id number]>G      03/09/2014 Procedure    Oncotype DX recurrence score 24, 16% risk of recurrence intermediate risk, patient offered chemotherapy      03/22/2014 -  Anti-estrogen oral therapy    Arimidex 1 mg daily       CHIEF COMPLIANT: Multiple aches and pains throughout her body, gaining weight, stiffness of the shoulders, leg swelling  INTERVAL HISTORY: Loretta Thomas is a 66 year old with above-mentioned history of right breast cancer who underwent mastectomy and is currently on Arimidex therapy. She does need that she is having lots of aches and pains throughout the body and stiffness. Her work involves a lot of sitting around. Because of this when she gets up she  feels extremely stiff and achy. She has not been able to join an exercise program because she feels tired towards end of the day. Her cholesterol medication was discontinued recently because of the muscle aches and pains. In spite of this her symptoms did not improve.  REVIEW OF SYSTEMS:   Constitutional: Denies fevers, chills or abnormal weight loss Eyes: Denies blurriness of vision Ears, nose, mouth, throat, and face: Denies mucositis or sore throat Respiratory: Denies cough, dyspnea or wheezes Cardiovascular: Denies palpitation, chest discomfort Gastrointestinal:  Denies nausea, heartburn or change in bowel habits Skin: Denies abnormal skin rashes Lymphatics: Denies new lymphadenopathy or easy bruising Neurological:Denies numbness, tingling or new weaknesses Behavioral/Psych: Mood is stable, no new changes  Extremities: No lower extremity edema  All other systems were reviewed with the patient and are negative.  I have reviewed the past medical history, past surgical history, social history and family history with the patient and they are unchanged from previous note.  ALLERGIES:  is allergic to codeine; demerol [meperidine]; hydrocodone; and lortab [hydrocodone-acetaminophen].  MEDICATIONS:  Current Outpatient Prescriptions  Medication Sig Dispense Refill  . ALPRAZolam (XANAX) 0.5 MG tablet Take 0.5 mg by mouth 2 (two) times daily as needed for anxiety.    Marland Kitchen anastrozole (ARIMIDEX) 1 MG tablet Take 1 tablet (1 mg total) by mouth daily. 90 tablet 3  . calcium carbonate (OS-CAL) 600 MG TABS tablet Take 600 mg by mouth 2 (two) times daily with a meal.    . carvedilol (COREG) 6.25 MG tablet Take  6.25 mg by mouth 2 (two) times daily with a meal.    . cyanocobalamin (,VITAMIN B-12,) 1000 MCG/ML injection Inject 1 mL (1,000 mcg total) into the muscle every 30 (thirty) days. 10 mL 0  . docusate sodium 100 MG CAPS Take 100 mg by mouth daily. 10 capsule 0  . losartan (COZAAR) 50 MG tablet Take  50 mg by mouth daily.    . Multiple Vitamin (MULTIVITAMIN) tablet Take 1 tablet by mouth daily.    . simvastatin (ZOCOR) 20 MG tablet Take 20 mg by mouth daily.    . traMADol (ULTRAM) 50 MG tablet Take 1 tablet (50 mg total) by mouth every 6 (six) hours as needed. 60 tablet 1  . venlafaxine XR (EFFEXOR-XR) 37.5 MG 24 hr capsule Take 1 capsule (37.5 mg total) by mouth daily with breakfast. 30 capsule 5  . Vitamin D, Ergocalciferol, (DRISDOL) 50000 UNITS CAPS capsule Take 50,000 Units by mouth every 7 (seven) days.    Marland Kitchen zolpidem (AMBIEN) 10 MG tablet Take 10 mg by mouth at bedtime as needed for sleep (for sleep).     No current facility-administered medications for this visit.     PHYSICAL EXAMINATION: ECOG PERFORMANCE STATUS: 1 - Symptomatic but completely ambulatory  Vitals:   07/09/16 1521  BP: 125/73  Pulse: 91  Resp: 18  Temp: 98.2 F (36.8 C)   Filed Weights   07/09/16 1521  Weight: 201 lb 4.8 oz (91.3 kg)    GENERAL:alert, no distress and comfortable SKIN: skin color, texture, turgor are normal, no rashes or significant lesions EYES: normal, Conjunctiva are pink and non-injected, sclera clear OROPHARYNX:no exudate, no erythema and lips, buccal mucosa, and tongue normal  NECK: supple, thyroid normal size, non-tender, without nodularity LYMPH:  no palpable lymphadenopathy in the cervical, axillary or inguinal LUNGS: clear to auscultation and percussion with normal breathing effort HEART: regular rate & rhythm and no murmurs and no lower extremity edema ABDOMEN:abdomen soft, non-tender and normal bowel sounds MUSCULOSKELETAL:no cyanosis of digits and no clubbing  NEURO: alert & oriented x 3 with fluent speech, no focal motor/sensory deficits EXTREMITIES: No lower extremity edema  LABORATORY DATA:  I have reviewed the data as listed   Chemistry      Component Value Date/Time   NA 141 06/21/2014 1035   K 4.2 06/21/2014 1035   CL 106 01/20/2014 1001   CO2 27 06/21/2014  1035   BUN 9.7 06/21/2014 1035   CREATININE 0.8 06/21/2014 1035      Component Value Date/Time   CALCIUM 9.3 06/21/2014 1035   ALKPHOS 55 06/21/2014 1035   AST 18 06/21/2014 1035   ALT <6 06/21/2014 1035   BILITOT 0.51 06/21/2014 1035       Lab Results  Component Value Date   WBC 7.1 06/21/2014   HGB 12.0 06/21/2014   HCT 36.6 06/21/2014   MCV 91.0 06/21/2014   PLT 173 06/21/2014   NEUTROABS 5.3 06/21/2014    ASSESSMENT & PLAN:  Breast cancer of upper-outer quadrant of right female breast ([facility]) 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 MRI of the breasts was reviewed with the patient which showed diffuse extension up to the area large area measuring 4.3 cm. This was biopsied 11/16/2013 came back as DCIS. 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  NCCN guidelines now recommend annual MRI screening OR a bilateral prophylactic mastectomy for PALB2 After discussing these options, patient preferred Surveillance with annual breast MRIs   Current treatment: Anastrozole 1 mg daily started 03/22/2014 5 years; takes half a tablet daily since December 2016 due to musculoskeletal pains, increased back to full dose from May 2017  Anastrozole toxicities: 1.severe myalgias and arthralgias: I decided to discontinue anastrozole. I started her on letrozole started 07/26/2016 2. Weight gain: I gave her instruction to join the [employer] live strong program. patient is unable to start the program because it is not available near her house.  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  Major depression: Much improved with Effexor. However patient would like to stop  Effexor. I encouraged her to taper it with taking it every other day for 2 weeks and then discontinuing it. Breast pain: undergone breast reconstruction.  Breast Cancer Surveillance: 1. Breast exam 07/09/2016: Benign 2. Mammogram 04/17/2016  No abnormalities. Postsurgical changes. Breast Density Category B. I recommended that she get 3-D mammograms for surveillance. 3. Breast MRI 05/04/2015: No abnormal enhancement. We will set up the next breast MRI for July 2018.  Pernicious anemia:On monthly B-12 injections.  Return to clinic in 3 months to follow-up on side effects to letrozole therapy.   I spent 25 minutes talking to the patient of which more than half was spent in counseling and coordination of care.  No orders of the defined types were placed in this encounter.  The patient has a good understanding of the overall plan. she agrees with it. she will call with any problems that may develop before the next visit here.   Rulon Eisenmenger, MD 07/09/16

## 2016-07-10 ENCOUNTER — Other Ambulatory Visit: Payer: Self-pay | Admitting: Emergency Medicine

## 2016-07-10 MED ORDER — LETROZOLE 2.5 MG PO TABS: 2.5000 mg | ORAL_TABLET | Freq: Every day | ORAL | 3 refills | Status: DC

## 2016-07-10 NOTE — Telephone Encounter (Signed)
Fax sent to [facility] for letrozole.

## 2016-07-20 ENCOUNTER — Other Ambulatory Visit: Payer: Self-pay | Admitting: Emergency Medicine

## 2016-07-20 ENCOUNTER — Telehealth: Payer: Self-pay | Admitting: Emergency Medicine

## 2016-07-20 DIAGNOSIS — C50411 Malignant neoplasm of upper-outer quadrant of right female breast: Secondary | ICD-10-CM

## 2016-07-20 MED ORDER — VENLAFAXINE HCL ER 37.5 MG PO CP24: 37.5000 mg | ORAL_CAPSULE | Freq: Every day | ORAL | 5 refills | Status: DC

## 2016-07-20 NOTE — Telephone Encounter (Signed)
Patient called with concerns about starting the Letrozole as instructed per Dr Lindi Adie; patient states she read the side effects and she is now concerned about the "hair loss" that could occur. She also states that the same side effects listed for letrozole are also listed for anastrozole.   Discussed with patient that the hair loss from letrozole is more likely hair thinning as opposed to balding. Patient states if the same side effects for letrozole are the same she experienced with anastrozole then she questions if she should just restart the anastrozole.   Will review with Dr Lindi Adie and instruct patient based on his advise.

## 2016-07-31 ENCOUNTER — Telehealth: Payer: Self-pay | Admitting: Emergency Medicine

## 2016-07-31 NOTE — Telephone Encounter (Signed)
Patient called with complaints of right breast pain and rash; patient states she called Dr Lear Ng office and he advised that she be seen at the cancer center; Appointment made for patient to see Dr Lindi Adie on 4/18; left message with on patient's cell phone with appointment date and time.

## 2016-08-01 ENCOUNTER — Telehealth: Payer: Self-pay | Admitting: Hematology and Oncology

## 2016-08-01 ENCOUNTER — Ambulatory Visit (HOSPITAL_BASED_OUTPATIENT_CLINIC_OR_DEPARTMENT_OTHER): Payer: BLUE CROSS/BLUE SHIELD | Admitting: Hematology and Oncology

## 2016-08-01 ENCOUNTER — Encounter: Payer: Self-pay | Admitting: Hematology and Oncology

## 2016-08-01 DIAGNOSIS — N644 Mastodynia: Secondary | ICD-10-CM

## 2016-08-01 DIAGNOSIS — C50411 Malignant neoplasm of upper-outer quadrant of right female breast: Secondary | ICD-10-CM | POA: Diagnosis not present

## 2016-08-01 DIAGNOSIS — D510 Vitamin B12 deficiency anemia due to intrinsic factor deficiency: Secondary | ICD-10-CM

## 2016-08-01 DIAGNOSIS — Z170 Estrogen receptor positive status [ER+]: Secondary | ICD-10-CM

## 2016-08-01 DIAGNOSIS — F329 Major depressive disorder, single episode, unspecified: Secondary | ICD-10-CM | POA: Diagnosis not present

## 2016-08-01 MED ORDER — FUROSEMIDE 20 MG PO TABS: 20.0000 mg | ORAL_TABLET | Freq: Every day | ORAL | 3 refills | Status: DC

## 2016-08-01 NOTE — Progress Notes (Signed)
Patient Care Team: Olga Millers, MD as PCP - General (Obstetrics and Gynecology) Rossie Muskrat, DO as Referring Physician (Family Medicine)  DIAGNOSIS:  Encounter Diagnosis  Name Primary?  . Malignant neoplasm of upper-outer quadrant of right breast in female, estrogen receptor positive ([place])     SUMMARY OF ONCOLOGIC HISTORY:   Breast cancer of upper-outer quadrant of right female breast ([place])   10/19/2013 Mammogram    outer right breast, middle third, demonstrate a 6 x 6 x 12 mm group of heterogeneous calcifications      11/10/2013 Breast MRI    nonmacerated enhancement in the retroareolar region extending to the nipple with the area of enhancement measuring 4.3 x 2.5 x 3.0 cm      11/12/2013 Initial Diagnosis    Right mastectomy: Invasive ductal carcinoma with DCIS with comedo-type necrosis and calcifications in ER 99% PR 30% Ki-67 40% HER-2 negative ratio 1.14; Grade 3; second biopsy from subareolar area done 11/16/2013 revealed DCIS with calcifications      12/29/2013 Procedure    BRCA 1 and 2 Negative: 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 (32-58% lifetime risk of breast cancer and increased lifetime risk of pancreatic cancer):RAD51D variant of unknown significance: c.[id number]>G      03/09/2014 Procedure    Oncotype DX recurrence score 24, 16% risk of recurrence intermediate risk, patient offered chemotherapy      03/22/2014 -  Anti-estrogen oral therapy    Arimidex 1 mg daily       CHIEF COMPLIANT: Complaining of breast swelling and discomfort in the right breast with throbbing pain  INTERVAL HISTORY: Loretta Thomas is a 66 year old with above-mentioned history of right mastectomy for right-sided breast cancer who had a reconstruction surgery. She was switched her from anastrozole to letrozole and currently tolerating it fairly well. She is here today complaining of increased pain swelling and throbbing discomfort in the right breast. She had tried different  remedies at home but it did not seem to improve. Only today appears to be somewhat less in intensity. She rates it as 8 out of 10 yesterday.  REVIEW OF SYSTEMS:   Constitutional: Denies fevers, chills or abnormal weight loss Eyes: Denies blurriness of vision Ears, nose, mouth, throat, and face: Denies mucositis or sore throat Respiratory: Denies cough, dyspnea or wheezes Cardiovascular: Denies palpitation, chest discomfort Gastrointestinal:  Denies nausea, heartburn or change in bowel habits Skin: Denies abnormal skin rashes Lymphatics: Denies new lymphadenopathy or easy bruising Neurological:Denies numbness, tingling or new weaknesses Behavioral/Psych: Mood is stable, no new changes  Extremities: No lower extremity edema Breast: Pain and swelling in the right reconstructed breast All other systems were reviewed with the patient and are negative.  I have reviewed the past medical history, past surgical history, social history and family history with the patient and they are unchanged from previous note.  ALLERGIES:  is allergic to codeine; demerol [meperidine]; hydrocodone; and lortab [hydrocodone-acetaminophen].  MEDICATIONS:  Current Outpatient Prescriptions  Medication Sig Dispense Refill  . calcium carbonate (OS-CAL) 600 MG TABS tablet Take 600 mg by mouth 2 (two) times daily with a meal.    . carvedilol (COREG) 6.25 MG tablet Take 6.25 mg by mouth 2 (two) times daily with a meal.    . cyanocobalamin (,VITAMIN B-12,) 1000 MCG/ML injection Inject 1 mL (1,000 mcg total) into the muscle every 30 (thirty) days. 10 mL 0  . docusate sodium 100 MG CAPS Take 100 mg by mouth daily. 10 capsule 0  . furosemide (LASIX) 20 MG  tablet Take 1 tablet (20 mg total) by mouth daily. 30 tablet 3  . letrozole (FEMARA) 2.5 MG tablet Take 1 tablet (2.5 mg total) by mouth daily. 90 tablet 3  . losartan (COZAAR) 50 MG tablet Take 50 mg by mouth daily.    . Multiple Vitamin (MULTIVITAMIN) tablet Take 1  tablet by mouth daily.    . simvastatin (ZOCOR) 20 MG tablet Take 20 mg by mouth daily.    Marland Kitchen venlafaxine XR (EFFEXOR-XR) 37.5 MG 24 hr capsule Take 1 capsule (37.5 mg total) by mouth daily with breakfast. 30 capsule 5  . Vitamin D, Ergocalciferol, (DRISDOL) 50000 UNITS CAPS capsule Take 50,000 Units by mouth every 7 (seven) days.     No current facility-administered medications for this visit.     PHYSICAL EXAMINATION: ECOG PERFORMANCE STATUS: 1 - Symptomatic but completely ambulatory  Vitals:   08/01/16 1041  BP: 127/62  Pulse: 86  Resp: 18  Temp: 97.4 F (36.3 C)   Filed Weights   08/01/16 1041  Weight: 198 lb 14.4 oz (90.2 kg)    GENERAL:alert, no distress and comfortable SKIN: skin color, texture, turgor are normal, no rashes or significant lesions EYES: normal, Conjunctiva are pink and non-injected, sclera clear OROPHARYNX:no exudate, no erythema and lips, buccal mucosa, and tongue normal  NECK: supple, thyroid normal size, non-tender, without nodularity LYMPH:  no palpable lymphadenopathy in the cervical, axillary or inguinal LUNGS: clear to auscultation and percussion with normal breathing effort HEART: regular rate & rhythm and no murmurs and no lower extremity edema ABDOMEN:abdomen soft, non-tender and normal bowel sounds MUSCULOSKELETAL:no cyanosis of digits and no clubbing  NEURO: alert & oriented x 3 with fluent speech, no focal motor/sensory deficits EXTREMITIES: Complains of extremity swelling intermittently. BREAST: The right reconstructed breast has mild to swelling. There is no palpable nodularity. There is tenderness to deep palpation. No palpable lumps or nodules of concern.. (exam performed in the presence of a chaperone)  LABORATORY DATA:  I have reviewed the data as listed   Chemistry      Component Value Date/Time   NA 141 06/21/2014 1035   K 4.2 06/21/2014 1035   CL 106 01/20/2014 1001   CO2 27 06/21/2014 1035   BUN 9.7 06/21/2014 1035    CREATININE 0.8 06/21/2014 1035      Component Value Date/Time   CALCIUM 9.3 06/21/2014 1035   ALKPHOS 55 06/21/2014 1035   AST 18 06/21/2014 1035   ALT <6 06/21/2014 1035   BILITOT 0.51 06/21/2014 1035       Lab Results  Component Value Date   WBC 7.1 06/21/2014   HGB 12.0 06/21/2014   HCT 36.6 06/21/2014   MCV 91.0 06/21/2014   PLT 173 06/21/2014   NEUTROABS 5.3 06/21/2014    ASSESSMENT & PLAN:  Breast cancer of upper-outer quadrant of right female breast ([place]) Right breast IDC with high-grade DCIS with comedonecrosis ER/PR positive HER-2 negative Ki-67 was 40%.  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 She may consider taking Effexor every other day. Breast pain: Accompanied  by swelling. I do not see any evidence of infection or lumps or nodules. I do not see any benefit from doing immediate imaging studies. Patient is scheduled to undergo breast MRI on 10/14/2016. I will see her back week later to discuss the results..  Breast Cancer Surveillance: 1. Mammogram 04/17/2016 No abnormalities. Postsurgical changes. Breast Density Category B. I recommended that she get 3-D mammograms for surveillance. 3. Breast MRI 05/04/2015: No abnormal enhancement. We will set up the next breast MRI for July 2018.  Pernicious anemia:On monthly B-12 injections.  Return to clinic in 3 months to follow-up on side effects to letrozole therapy.   I spent 25 minutes talking to the patient of which more than  half was spent in counseling and coordination of care.  No orders of the defined types were placed in this encounter.  The patient has a good understanding of the overall plan. she agrees with it. she will call with any problems that may develop before the next visit here.   Rulon Eisenmenger, MD 08/01/16

## 2016-08-01 NOTE — Assessment & Plan Note (Signed)
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  NCCN guidelines now recommend annual MRI screening OR a bilateral prophylactic mastectomy for PALB2 patient preferredSurveillance with annual breast MRIs   Current treatment: Anastrozole 1 mg daily started 03/22/2014 5 years; switched to letrozole 07/26/2016 Patient read the package insert of letrozole and had several questions including the risk of hair loss. I discussed with her that hair thinning could happen however it is not likely to cause some profound hair loss or balding. I encouraged her to take Biotene for hair and nails.  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  Major depression: Much improved with Effexor. However patient would like to stop Effexor. I encouraged her to taper it with taking it every other day for 2 weeks and then discontinuing it. Breast pain: undergone breast reconstruction.  Breast Cancer Surveillance: 1. Breast exam 07/09/2016: Benign 2. Mammogram 04/17/2016 No abnormalities. Postsurgical changes. Breast Density Category B. I recommended that she get 3-D mammograms for surveillance. 3. Breast MRI 05/04/2015: No abnormal enhancement. We will set up the next breast MRI for July 2018.  Pernicious anemia:On monthly B-12 injections.  Return to clinic in 3 months to follow-up on side effects to letrozole therapy.

## 2016-08-01 NOTE — Telephone Encounter (Signed)
Gave patient avs report and appointments for July.  °

## 2016-09-24 ENCOUNTER — Ambulatory Visit: Payer: BLUE CROSS/BLUE SHIELD | Admitting: Hematology and Oncology

## 2016-10-14 ENCOUNTER — Ambulatory Visit
Admission: RE | Admit: 2016-10-14 | Discharge: 2016-10-14 | Disposition: A | Discharge status: Home / Self Care | Payer: BLUE CROSS/BLUE SHIELD | Source: Ambulatory Visit | Attending: Hematology and Oncology | Admitting: Hematology and Oncology

## 2016-10-14 DIAGNOSIS — Z1589 Genetic susceptibility to other disease: Secondary | ICD-10-CM

## 2016-10-14 DIAGNOSIS — C50919 Malignant neoplasm of unspecified site of unspecified female breast: Secondary | ICD-10-CM

## 2016-10-14 DIAGNOSIS — Z170 Estrogen receptor positive status [ER+]: Principal | ICD-10-CM

## 2016-10-14 DIAGNOSIS — Z1509 Genetic susceptibility to other malignant neoplasm: Secondary | ICD-10-CM

## 2016-10-14 DIAGNOSIS — C50411 Malignant neoplasm of upper-outer quadrant of right female breast: Secondary | ICD-10-CM

## 2016-10-14 DIAGNOSIS — Z1502 Genetic susceptibility to malignant neoplasm of ovary: Secondary | ICD-10-CM

## 2016-10-23 ENCOUNTER — Ambulatory Visit (HOSPITAL_BASED_OUTPATIENT_CLINIC_OR_DEPARTMENT_OTHER): Payer: BLUE CROSS/BLUE SHIELD | Admitting: Hematology and Oncology

## 2016-10-23 ENCOUNTER — Encounter: Payer: Self-pay | Admitting: Hematology and Oncology

## 2016-10-23 DIAGNOSIS — D510 Vitamin B12 deficiency anemia due to intrinsic factor deficiency: Secondary | ICD-10-CM | POA: Diagnosis not present

## 2016-10-23 DIAGNOSIS — Z170 Estrogen receptor positive status [ER+]: Secondary | ICD-10-CM

## 2016-10-23 DIAGNOSIS — C50411 Malignant neoplasm of upper-outer quadrant of right female breast: Secondary | ICD-10-CM | POA: Diagnosis not present

## 2016-10-23 DIAGNOSIS — N644 Mastodynia: Secondary | ICD-10-CM | POA: Diagnosis not present

## 2016-10-23 DIAGNOSIS — F329 Major depressive disorder, single episode, unspecified: Secondary | ICD-10-CM

## 2016-10-23 NOTE — Progress Notes (Signed)
Patient Care Team: Olga Millers, MD as PCP - General (Obstetrics and Gynecology) Rossie Muskrat, DO as Referring Physician (Family Medicine)  DIAGNOSIS:  Encounter Diagnosis  Name Primary?  . Malignant neoplasm of upper-outer quadrant of right breast in female, estrogen receptor positive ([place])     SUMMARY OF ONCOLOGIC HISTORY:   Breast cancer of upper-outer quadrant of right female breast ([place])   10/19/2013 Mammogram    outer right breast, middle third, demonstrate a 6 x 6 x 12 mm group of heterogeneous calcifications      11/10/2013 Breast MRI    nonmacerated enhancement in the retroareolar region extending to the nipple with the area of enhancement measuring 4.3 x 2.5 x 3.0 cm      11/12/2013 Initial Diagnosis    Right mastectomy: Invasive ductal carcinoma with DCIS with comedo-type necrosis and calcifications in ER 99% PR 30% Ki-67 40% HER-2 negative ratio 1.14; Grade 3; second biopsy from subareolar area done 11/16/2013 revealed DCIS with calcifications      12/29/2013 Procedure    BRCA 1 and 2 Negative: 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 (32-58% lifetime risk of breast cancer and increased lifetime risk of pancreatic cancer):RAD51D variant of unknown significance: c.481-5t>G      03/09/2014 Procedure    Oncotype DX recurrence score 24, 16% risk of recurrence intermediate risk, patient offered chemotherapy      03/22/2014 -  Anti-estrogen oral therapy    Arimidex 1 mg daily switched to letrozole 07/26/2016 due to muscle aches and pains       CHIEF COMPLIANT: Follow-up after recent breast MRI and toxicity evaluation on letrozole  INTERVAL HISTORY: Nevelyn Mellott is a 66 year old with above-mentioned history of PALB2 mutation  who is currently on letrozole therapy and tolerating it very well. She denies any lumps or nodules in the breasts. Recent breast MRI did not show any evidence of recurrence. She reports some muscle stiffness and achiness but overall able to  tolerate this much better than anastrozole.   REVIEW OF SYSTEMS:   Constitutional: Denies fevers, chills or abnormal weight loss Eyes: Denies blurriness of vision Ears, nose, mouth, throat, and face: Denies mucositis or sore throat Respiratory: Denies cough, dyspnea or wheezes Cardiovascular: Denies palpitation, chest discomfort Gastrointestinal:  Denies nausea, heartburn or change in bowel habits Skin: Denies abnormal skin rashes Lymphatics: Denies new lymphadenopathy or easy bruising Neurological:Denies numbness, tingling or new weaknesses Behavioral/Psych: Mood is stable, no new changes  Extremities: No lower extremity edema Breast:  denies any pain or lumps or nodules in either breasts All other systems were reviewed with the patient and are negative.  I have reviewed the past medical history, past surgical history, social history and family history with the patient and they are unchanged from previous note.  ALLERGIES:  is allergic to codeine; demerol [meperidine]; hydrocodone; and lortab [hydrocodone-acetaminophen].  MEDICATIONS:  Current Outpatient Prescriptions  Medication Sig Dispense Refill  . calcium carbonate (OS-CAL) 600 MG TABS tablet Take 600 mg by mouth 2 (two) times daily with a meal.    . carvedilol (COREG) 6.25 MG tablet Take 6.25 mg by mouth 2 (two) times daily with a meal.    . cyanocobalamin (,VITAMIN B-12,) 1000 MCG/ML injection Inject 1 mL (1,000 mcg total) into the muscle every 30 (thirty) days. 10 mL 0  . docusate sodium 100 MG CAPS Take 100 mg by mouth daily. 10 capsule 0  . furosemide (LASIX) 20 MG tablet Take 1 tablet (20 mg total) by mouth daily. 30 tablet  3  . letrozole (FEMARA) 2.5 MG tablet Take 1 tablet (2.5 mg total) by mouth daily. 90 tablet 3  . losartan (COZAAR) 50 MG tablet Take 50 mg by mouth daily.    . Multiple Vitamin (MULTIVITAMIN) tablet Take 1 tablet by mouth daily.    . simvastatin (ZOCOR) 20 MG tablet Take 20 mg by mouth daily.    Marland Kitchen  venlafaxine XR (EFFEXOR-XR) 37.5 MG 24 hr capsule Take 1 capsule (37.5 mg total) by mouth daily with breakfast. 30 capsule 5  . Vitamin D, Ergocalciferol, (DRISDOL) 50000 UNITS CAPS capsule Take 50,000 Units by mouth every 7 (seven) days.     No current facility-administered medications for this visit.     PHYSICAL EXAMINATION: ECOG PERFORMANCE STATUS: 1 - Symptomatic but completely ambulatory  Vitals:   10/23/16 1518  BP: 112/69  Pulse: 81  Resp: 17  Temp: 98.2 F (36.8 C)   Filed Weights   10/23/16 1518  Weight: 198 lb 1.6 oz (89.9 kg)    GENERAL:alert, no distress and comfortable SKIN: skin color, texture, turgor are normal, no rashes or significant lesions EYES: normal, Conjunctiva are pink and non-injected, sclera clear OROPHARYNX:no exudate, no erythema and lips, buccal mucosa, and tongue normal  NECK: supple, thyroid normal size, non-tender, without nodularity LYMPH:  no palpable lymphadenopathy in the cervical, axillary or inguinal LUNGS: clear to auscultation and percussion with normal breathing effort HEART: regular rate & rhythm and no murmurs and no lower extremity edema ABDOMEN:abdomen soft, non-tender and normal bowel sounds MUSCULOSKELETAL:no cyanosis of digits and no clubbing  NEURO: alert & oriented x 3 with fluent speech, no focal motor/sensory deficits EXTREMITIES: No lower extremity edema  LABORATORY DATA:  I have reviewed the data as listed   Chemistry      Component Value Date/Time   NA 141 06/21/2014 1035   K 4.2 06/21/2014 1035   CL 106 01/20/2014 1001   CO2 27 06/21/2014 1035   BUN 9.7 06/21/2014 1035   CREATININE 0.8 06/21/2014 1035      Component Value Date/Time   CALCIUM 9.3 06/21/2014 1035   ALKPHOS 55 06/21/2014 1035   AST 18 06/21/2014 1035   ALT <6 06/21/2014 1035   BILITOT 0.51 06/21/2014 1035       Lab Results  Component Value Date   WBC 7.1 06/21/2014   HGB 12.0 06/21/2014   HCT 36.6 06/21/2014   MCV 91.0 06/21/2014    PLT 173 06/21/2014   NEUTROABS 5.3 06/21/2014    ASSESSMENT & PLAN:  Breast cancer of upper-outer quadrant of right female breast (HCC) Right breast IDC with high-grade DCIS with comedonecrosis ER/PR positive HER-2 negative Ki-67 was 40%.  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 She may consider taking Effexor every other day.  Breast pain: Accompanied by swelling. I do not see any evidence of infection or lumps or nodules.  Breast MRI 10/14/2016:No MRI evidence of malignancy status post mastectomy, questionable cardiomegaly  Breast Cancer Surveillance: 1. Mammogram 04/17/2016 No abnormalities. Postsurgical changes. Breast Density Category B. I recommended that she get  3-D mammograms for surveillance. 3. Breast MRI 10/15/2016: No abnormal enhancement. We will set up the next breast MRI for July 2019.  Pernicious anemia:Onmonthly B-12 injections.  Return to clinic in 6 months for follow-up    I spent 25 minutes talking to the patient of which more than half was spent in counseling and coordination of care.  No orders of the defined types were placed in this encounter.  The patient has a good understanding of the overall plan. she agrees with it. she will call with any problems that may develop before the next visit here.   Rulon Eisenmenger, MD 10/23/16

## 2016-10-23 NOTE — Assessment & Plan Note (Signed)
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 She may consider taking Effexor every other day.  Breast pain: Accompanied by swelling. I do not see any evidence of infection or lumps or nodules.  Breast MRI 10/14/2016:No MRI evidence of malignancy status post mastectomy, questionable cardiomegaly  Breast Cancer Surveillance: 1. Mammogram 04/17/2016 No abnormalities. Postsurgical changes. Breast Density Category B. I recommended that she get 3-D mammograms for surveillance. 3. Breast MRI 10/15/2016: No abnormal enhancement. We will set up the next breast MRI for July 2019.  Pernicious anemia:Onmonthly B-12 injections.  Return to clinic in 6 months for follow-up

## 2016-12-31 ENCOUNTER — Telehealth: Payer: Self-pay

## 2016-12-31 NOTE — Telephone Encounter (Signed)
Pt calling to report that she had a skin growth in between her breast that started after she got home from the beach last July. Pt had breast MRI and mammogram done this year, which were both good results. Pt went to primary care MD and was prescribed a cream to apply for 6 weeks. Pt did state that she is noticing the bump getting smaller. She was concern about her breast cancer and wanted to know if this skin growth (less than a dime size) is something she should be concerned about. Her PCP states that it is too small for a skin biopsy at this time. Pt was unsure of type of cream prescribed. Pt denies any swelling, pain, lumps and unusual growth around her nipple. Advised pt to complete course of cream treatment and to notify MD if she starts to have more concerning symptoms, such as, lumps, pain, swelling in the breast or armpit area. Also note of increase growth of bump on her skin and any drainage and redness, accompanied by fevers and chills. Pt verbalize understanding and will monitor closely. No further concerns at this time.

## 2017-01-01 NOTE — Telephone Encounter (Signed)
error 

## 2017-01-25 ENCOUNTER — Other Ambulatory Visit: Payer: Self-pay

## 2017-01-25 ENCOUNTER — Telehealth: Payer: Self-pay | Admitting: Hematology and Oncology

## 2017-01-25 DIAGNOSIS — C50411 Malignant neoplasm of upper-outer quadrant of right female breast: Secondary | ICD-10-CM

## 2017-01-25 MED ORDER — VENLAFAXINE HCL ER 37.5 MG PO CP24: 37.5000 mg | ORAL_CAPSULE | Freq: Every day | ORAL | 5 refills | Status: DC

## 2017-01-25 NOTE — Telephone Encounter (Signed)
Refill request for venlafaxine electronically sent to [facility]

## 2017-01-25 NOTE — Telephone Encounter (Signed)
01/24/17 prescription refill scanned in media °

## 2017-01-31 ENCOUNTER — Other Ambulatory Visit: Payer: Self-pay

## 2017-02-01 ENCOUNTER — Telehealth: Payer: Self-pay | Admitting: Medical Oncology

## 2017-02-01 ENCOUNTER — Other Ambulatory Visit: Payer: Self-pay | Admitting: Medical Oncology

## 2017-02-01 DIAGNOSIS — C50411 Malignant neoplasm of upper-outer quadrant of right female breast: Secondary | ICD-10-CM

## 2017-02-01 MED ORDER — VENLAFAXINE HCL ER 37.5 MG PO CP24: 37.5000 mg | ORAL_CAPSULE | Freq: Every day | ORAL | 5 refills | Status: DC

## 2017-02-01 NOTE — Telephone Encounter (Addendum)
Called in effexor, with 5 refills.

## 2017-02-01 NOTE — Telephone Encounter (Addendum)
I called [facility] and no one answered my call -multiple times. I faxed message to pharmacy to call me back re effexor refill. Left message for pt about this and to return my call.

## 2017-03-04 ENCOUNTER — Telehealth: Payer: Self-pay

## 2017-03-04 NOTE — Telephone Encounter (Signed)
Pt called and wanting to fax her labs to Dr Lindi Adie. she is wanting as part of her record. She has a question about her ALT being 4 with range 6-29. She was given the fax #.

## 2017-03-11 ENCOUNTER — Other Ambulatory Visit: Payer: Self-pay | Admitting: Hematology and Oncology

## 2017-03-11 DIAGNOSIS — Z1231 Encounter for screening mammogram for malignant neoplasm of breast: Secondary | ICD-10-CM

## 2017-03-15 ENCOUNTER — Other Ambulatory Visit: Payer: Self-pay

## 2017-03-15 MED ORDER — CYANOCOBALAMIN 1000 MCG/ML IJ SOLN: 1000.0000 ug | INTRAMUSCULAR | 0 refills | Status: DC

## 2017-04-18 ENCOUNTER — Telehealth: Payer: Self-pay

## 2017-04-18 NOTE — Telephone Encounter (Signed)
Returned pt call regarding diagnosis from the ED in on December 23rd. She was diagnosed with chronic diastolic heart failure and they wanted her to follow up with a cardiologist, Dr. Luiz Ochoa. We agreed with this and told the pt she needs to follow up with him. No further questions at this time.  Cyndia Bent RN

## 2017-04-19 ENCOUNTER — Ambulatory Visit
Admission: RE | Admit: 2017-04-19 | Discharge: 2017-04-19 | Disposition: A | Discharge status: Home / Self Care | Payer: BLUE CROSS/BLUE SHIELD | Source: Ambulatory Visit | Attending: Hematology and Oncology | Admitting: Hematology and Oncology

## 2017-04-19 DIAGNOSIS — Z1231 Encounter for screening mammogram for malignant neoplasm of breast: Secondary | ICD-10-CM

## 2017-04-22 ENCOUNTER — Other Ambulatory Visit: Payer: Self-pay | Admitting: Hematology and Oncology

## 2017-04-22 DIAGNOSIS — R928 Other abnormal and inconclusive findings on diagnostic imaging of breast: Secondary | ICD-10-CM

## 2017-04-23 ENCOUNTER — Telehealth: Payer: Self-pay

## 2017-04-23 NOTE — Telephone Encounter (Signed)
Returned patient VM regarding request to reschedule her MD appt tomorrow since pt has a mammogram appt the same day.  Informed pt that her mammogram appt may take a little longer and the those results will not be ready in time for her doctor to review.  Pt agreeable to keep MD appt on 04/25/17 as scheduled. Also noted that she was previously informed of heart condition at [facility] ED and that she should follow up with cardiologist since this is not cancer related. Pt voiced understanding and she will contact her PCP for a referral.

## 2017-04-24 ENCOUNTER — Ambulatory Visit
Admission: RE | Admit: 2017-04-24 | Discharge: 2017-04-24 | Disposition: A | Discharge status: Home / Self Care | Payer: BLUE CROSS/BLUE SHIELD | Source: Ambulatory Visit | Attending: Hematology and Oncology | Admitting: Hematology and Oncology

## 2017-04-24 ENCOUNTER — Other Ambulatory Visit: Payer: Self-pay | Admitting: Hematology and Oncology

## 2017-04-24 DIAGNOSIS — N6320 Unspecified lump in the left breast, unspecified quadrant: Secondary | ICD-10-CM

## 2017-04-24 DIAGNOSIS — R928 Other abnormal and inconclusive findings on diagnostic imaging of breast: Secondary | ICD-10-CM

## 2017-04-25 ENCOUNTER — Other Ambulatory Visit: Payer: Self-pay | Admitting: Hematology and Oncology

## 2017-04-25 ENCOUNTER — Ambulatory Visit: Payer: BLUE CROSS/BLUE SHIELD | Admitting: Hematology and Oncology

## 2017-04-25 DIAGNOSIS — N6320 Unspecified lump in the left breast, unspecified quadrant: Secondary | ICD-10-CM

## 2017-04-29 ENCOUNTER — Ambulatory Visit
Admission: RE | Admit: 2017-04-29 | Discharge: 2017-04-29 | Disposition: A | Discharge status: Home / Self Care | Payer: BLUE CROSS/BLUE SHIELD | Source: Ambulatory Visit | Attending: Hematology and Oncology | Admitting: Hematology and Oncology

## 2017-04-29 DIAGNOSIS — N6320 Unspecified lump in the left breast, unspecified quadrant: Secondary | ICD-10-CM

## 2017-05-06 NOTE — Assessment & Plan Note (Signed)
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 She may consider taking Effexor every other day.  Breast pain: Accompanied by swelling. I do not see any evidence of infection or lumps or nodules.  Breast MRI 10/14/2016:No MRI evidence of malignancy status post mastectomy, questionable cardiomegaly  Breast Cancer Surveillance: 1. Mammogram 04/24/17 indeterminate mass left breast, biopsy FC changes. Breast Density Category B.   Pernicious anemia:Onmonthly B-12 injections.  Return to clinic in 1 year for follow-up

## 2017-05-07 ENCOUNTER — Inpatient Hospital Stay: Payer: BLUE CROSS/BLUE SHIELD | Attending: Hematology and Oncology | Admitting: Hematology and Oncology

## 2017-05-07 ENCOUNTER — Telehealth: Payer: Self-pay | Admitting: Hematology and Oncology

## 2017-05-07 DIAGNOSIS — Z170 Estrogen receptor positive status [ER+]: Secondary | ICD-10-CM | POA: Insufficient documentation

## 2017-05-07 DIAGNOSIS — F329 Major depressive disorder, single episode, unspecified: Secondary | ICD-10-CM | POA: Diagnosis not present

## 2017-05-07 DIAGNOSIS — D510 Vitamin B12 deficiency anemia due to intrinsic factor deficiency: Secondary | ICD-10-CM

## 2017-05-07 DIAGNOSIS — C50411 Malignant neoplasm of upper-outer quadrant of right female breast: Secondary | ICD-10-CM | POA: Insufficient documentation

## 2017-05-07 DIAGNOSIS — N63 Unspecified lump in unspecified breast: Secondary | ICD-10-CM

## 2017-05-07 DIAGNOSIS — N644 Mastodynia: Secondary | ICD-10-CM | POA: Diagnosis not present

## 2017-05-07 DIAGNOSIS — Z1231 Encounter for screening mammogram for malignant neoplasm of breast: Secondary | ICD-10-CM

## 2017-05-07 MED ORDER — FUROSEMIDE 20 MG PO TABS: 20.0000 mg | ORAL_TABLET | Freq: Every day | ORAL | 3 refills | Status: DC

## 2017-05-07 MED ORDER — LETROZOLE 2.5 MG PO TABS: 2.5000 mg | ORAL_TABLET | Freq: Every day | ORAL | 3 refills | Status: DC

## 2017-05-07 NOTE — Telephone Encounter (Signed)
Gave patient AVs and calendar of upcoming January 2020 appointments.  °

## 2017-05-07 NOTE — Progress Notes (Signed)
Patient Care Team: Olga Millers, MD as PCP - General (Obstetrics and Gynecology) Rossie Muskrat, DO as Referring Physician (Family Medicine)  DIAGNOSIS:  Encounter Diagnoses  Name Primary?  . Malignant neoplasm of upper-outer quadrant of right breast in female, estrogen receptor positive ([place])   . Encounter for screening mammogram for malignant neoplasm of breast     SUMMARY OF ONCOLOGIC HISTORY:   Breast cancer of upper-outer quadrant of right female breast ([place])   10/19/2013 Mammogram    outer right breast, middle third, demonstrate a 6 x 6 x 12 mm group of heterogeneous calcifications      11/10/2013 Breast MRI    nonmacerated enhancement in the retroareolar region extending to the nipple with the area of enhancement measuring 4.3 x 2.5 x 3.0 cm      11/12/2013 Initial Diagnosis    Right mastectomy: Invasive ductal carcinoma with DCIS with comedo-type necrosis and calcifications in ER 99% PR 30% Ki-67 40% HER-2 negative ratio 1.14; Grade 3; second biopsy from subareolar area done 11/16/2013 revealed DCIS with calcifications      12/29/2013 Procedure    BRCA 1 and 2 Negative: 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  She did not need systemic chemotherapy.  He is currently on antiestrogen therapy with letrozole.  She could not tolerate anastrozole.  She denies any pain or discomfort.   Denies any lumps or nodules in the left breast  REVIEW OF SYSTEMS:   Constitutional: Denies fevers, chills or abnormal weight loss Eyes: Denies blurriness of vision Ears, nose, mouth, throat, and face: Denies mucositis or sore throat Respiratory: Denies cough, dyspnea or wheezes Cardiovascular: Denies palpitation, chest discomfort Gastrointestinal:  Denies nausea, heartburn or change in bowel habits Skin: Denies abnormal skin rashes Lymphatics: Denies new lymphadenopathy or easy bruising Neurological:Denies numbness, tingling or new weaknesses Behavioral/Psych: Mood is stable, no new changes  Extremities: No lower extremity edema Breast:  denies any pain or lumps or nodules in either breasts or chest wall All other systems were reviewed with the patient and are negative.  I have reviewed the past medical history, past surgical history, social history and family history with the patient and they are unchanged from previous note.  ALLERGIES:  is allergic to codeine; demerol [meperidine]; hydrocodone; and lortab [hydrocodone-acetaminophen].  MEDICATIONS:  Current Outpatient Medications  Medication Sig Dispense Refill  . calcium carbonate (OS-CAL) 600 MG TABS tablet Take 600 mg by mouth 2 (two) times daily with a meal.    . carvedilol (COREG) 6.25 MG tablet Take 6.25 mg by mouth 2 (two) times daily with a meal.    . cyanocobalamin (,VITAMIN B-12,) 1000 MCG/ML injection Inject 1 mL (1,000 mcg total) into the muscle every 30 (thirty) days. 10 mL 0  . docusate sodium 100 MG CAPS Take 100 mg by mouth daily. 10 capsule 0  . furosemide (LASIX) 20 MG tablet Take 1 tablet (20 mg total) by  mouth daily. 30 tablet 3  . letrozole (FEMARA) 2.5 MG tablet Take 1 tablet (2.5 mg total) by mouth daily. 90 tablet 3  . losartan (COZAAR) 50 MG tablet Take 50 mg by mouth daily.    . Multiple Vitamin (MULTIVITAMIN) tablet Take 1 tablet by mouth daily.    . simvastatin (ZOCOR) 20 MG tablet Take 20 mg by mouth  daily.    Marland Kitchen venlafaxine XR (EFFEXOR-XR) 37.5 MG 24 hr capsule Take 1 capsule (37.5 mg total) by mouth daily with breakfast. 30 capsule 5  . Vitamin D, Ergocalciferol, (DRISDOL) 50000 UNITS CAPS capsule Take 50,000 Units by mouth every 7 (seven) days.     No current facility-administered medications for this visit.     PHYSICAL EXAMINATION: ECOG PERFORMANCE STATUS: 1 - Symptomatic but completely ambulatory  Vitals:   05/07/17 1345  BP: 119/80  Pulse: 92  Resp: 19  Temp: 97.8 F (36.6 C)  SpO2: 98%   Filed Weights   05/07/17 1345  Weight: 188 lb 14.4 oz (85.7 kg)    GENERAL:alert, no distress and comfortable SKIN: skin color, texture, turgor are normal, no rashes or significant lesions EYES: normal, Conjunctiva are pink and non-injected, sclera clear OROPHARYNX:no exudate, no erythema and lips, buccal mucosa, and tongue normal  NECK: supple, thyroid normal size, non-tender, without nodularity LYMPH:  no palpable lymphadenopathy in the cervical, axillary or inguinal LUNGS: clear to auscultation and percussion with normal breathing effort HEART: regular rate & rhythm and no murmurs and no lower extremity edema ABDOMEN:abdomen soft, non-tender and normal bowel sounds MUSCULOSKELETAL:no cyanosis of digits and no clubbing  NEURO: alert & oriented x 3 with fluent speech, no focal motor/sensory deficits EXTREMITIES: No lower extremity edema BREAST: No palpable lumps or nodules in the chest wall or axilla. (exam performed in the presence of a chaperone)  LABORATORY DATA:  I have reviewed the data as listed CMP Latest Ref Rng & Units 06/21/2014 01/20/2014  Glucose 70 - 140 mg/dl 99 86  BUN 7.0 - 26.0 mg/dL 9.7 9  Creatinine 0.6 - 1.1 mg/dL 0.8 0.70  Sodium 136 - 145 mEq/L 141 145  Potassium 3.5 - 5.1 mEq/L 4.2 4.0  Chloride 96 - 112 mEq/L - 106  CO2 22 - 29 mEq/L 27 26  Calcium 8.4 - 10.4 mg/dL 9.3 9.3  Total Protein 6.4 - 8.3 g/dL 7.1 7.6  Total Bilirubin 0.20 - 1.20 mg/dL 0.51  0.4  Alkaline Phos 40 - 150 U/L 55 58  AST 5 - 34 U/L 18 22  ALT 0 - 55 U/L <6 10    Lab Results  Component Value Date   WBC 7.1 06/21/2014   HGB 12.0 06/21/2014   HCT 36.6 06/21/2014   MCV 91.0 06/21/2014   PLT 173 06/21/2014   NEUTROABS 5.3 06/21/2014    ASSESSMENT & PLAN:  Breast cancer of upper-outer quadrant of right female breast ([place]) Right breast IDC with high-grade DCIS with comedonecrosis ER/PR positive HER-2 negative Ki-67 was 40%.  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 She may consider taking Effexor  every other day.  Breast pain: Accompanied by swelling. I do not see any evidence of infection or lumps or nodules.  Breast MRI 10/14/2016:No MRI evidence of malignancy status post mastectomy, questionable cardiomegaly  Breast Cancer Surveillance: 1. Mammogram 04/24/17 indeterminate mass left breast, biopsy FC changes. Breast Density Category B.   Pernicious anemia:Onmonthly B-12 injections.  Return to clinic in 1 year for follow-up    I spent 25 minutes talking to the patient of which more than half was spent in counseling and coordination of care.  Orders Placed This Encounter  Procedures  . MR BREAST BILATERAL W WO CONTRAST INC CAD    Standing Status:   Future    Standing Expiration Date:   07/06/2018    Order Specific Question:   If indicated for the ordered procedure, I authorize the administration of contrast media per Radiology protocol    Answer:   Yes    Order  Specific Question:   What is the patient's sedation requirement?    Answer:   No Sedation    Order Specific Question:   Does the patient have a pacemaker or implanted devices?    Answer:   No    Order Specific Question:   Radiology Contrast Protocol - do NOT remove file path    Answer:   [email or web address]    Order Specific Question:   Reason for Exam additional comments    Answer:   High risk breast cancer    Order Specific Question:   Preferred imaging location?    Answer:   [facility] (table limit-350 lbs)   The patient has a good understanding of the overall plan. she agrees with it. she will call with any problems that may develop before the next visit here.   Harriette Ohara, MD 05/07/17

## 2017-05-14 ENCOUNTER — Other Ambulatory Visit: Payer: Self-pay

## 2017-05-14 MED ORDER — CYANOCOBALAMIN 1000 MCG/ML IJ SOLN: 1000.0000 ug | INTRAMUSCULAR | 0 refills | Status: DC

## 2017-08-02 ENCOUNTER — Other Ambulatory Visit: Payer: Self-pay

## 2017-08-02 DIAGNOSIS — C50411 Malignant neoplasm of upper-outer quadrant of right female breast: Secondary | ICD-10-CM

## 2017-08-02 MED ORDER — VENLAFAXINE HCL ER 37.5 MG PO CP24: 37.5000 mg | ORAL_CAPSULE | Freq: Every day | ORAL | 5 refills | Status: DC

## 2017-09-03 ENCOUNTER — Other Ambulatory Visit: Payer: Self-pay

## 2017-09-03 MED ORDER — FUROSEMIDE 20 MG PO TABS: 20.0000 mg | ORAL_TABLET | Freq: Every day | ORAL | 3 refills | Status: DC

## 2017-10-02 ENCOUNTER — Encounter: Payer: Self-pay | Admitting: *Deleted

## 2017-10-02 ENCOUNTER — Encounter: Payer: Self-pay | Admitting: Cardiovascular Disease

## 2017-10-02 ENCOUNTER — Ambulatory Visit (INDEPENDENT_AMBULATORY_CARE_PROVIDER_SITE_OTHER): Payer: BLUE CROSS/BLUE SHIELD | Admitting: Cardiovascular Disease

## 2017-10-02 VITALS — BP 104/80 | HR 96 | Ht 67.0 in | Wt 183.4 lb

## 2017-10-02 DIAGNOSIS — I509 Heart failure, unspecified: Secondary | ICD-10-CM

## 2017-10-02 DIAGNOSIS — I10 Essential (primary) hypertension: Secondary | ICD-10-CM | POA: Diagnosis not present

## 2017-10-02 DIAGNOSIS — I447 Left bundle-branch block, unspecified: Secondary | ICD-10-CM

## 2017-10-02 NOTE — Patient Instructions (Addendum)
Medication Instructions:   Your physician recommends that you continue on your current medications as directed. Please refer to the Current Medication list given to you today.  Labwork:  NONE  Testing/Procedures: Your physician has requested that you have an echocardiogram. Echocardiography is a painless test that uses sound waves to create images of your heart. It provides your doctor with information about the size and shape of your heart and how well your heart's chambers and valves are working. This procedure takes approximately one hour. There are no restrictions for this procedure.  Follow-Up:  Your physician recommends that you schedule a follow-up appointment in: 6 weeks in [place].  Any Other Special Instructions Will Be Listed Below (If Applicable).  If you need a refill on your cardiac medications before your next appointment, please call your pharmacy.

## 2017-10-02 NOTE — Progress Notes (Signed)
CARDIOLOGY CONSULT NOTE  Patient ID: Loretta Thomas MRN: [id number] DOB/AGE: 1950/05/13 67 y.o.  Admit date: (Not on file) Primary Physician: Rossie Muskrat, DO Referring Physician: Dr. Ouida Sills  Reason for Consultation: Congestive heart failure  HPI: Loretta Thomas is a 67 y.o. female who is being seen today for the evaluation of congestive heart failure at the request of Olga Millers, MD.   She has a history of left bundle branch block with minimal coronary disease by coronary angiography in 2013.  Past medical history includes right-sided breast cancer status post mastectomy and treated with letrozole most recently.  She was recently hospitalized at [facility].  I reviewed extensive documentation, labs, and studies.  She presented with shortness of breath and a chest x-ray reportedly demonstrated a small right-sided pleural effusion and interstitial infiltrates.  A CT angiogram showed no evidence of pulmonary embolism.  She was discharged on 09/29/2017 on Lasix 20 mg once daily.  She was also on doxycycline 100 mg twice daily.  Labs showed N-terminal pro BNP 5955, normal troponins, d-dimer 1.45, hemoglobin 11.8, white blood cell 7, BUN 13, creatinine 0.83, sodium 141, potassium 4.3.  I personally reviewed the ECG performed on 09/28/2017 which demonstrated sinus rhythm with left bundle branch block.  I reviewed an additional ECG dated 09/29/2017 which also showed sinus rhythm with left bundle branch block.  She told me she is undergone for cardiac catheterizations in the last 20 years with the most recent one being in 2013 in [place].  I will have to request a copy of this report.  She told me she was hospitalized in [place] on 04/07/2017 with CHF.  Most recently she initially had complaints of abdominal distention and left flank soreness with some swelling in the legs and the feet.  She denies chest pain.  She said her breathing has improved.  She brought  extensive records for my review.  I reviewed an ECG which showed sinus rhythm with a left bundle branch block both on 04/17/2012 and December 2018.  I also reviewed a cardiac catheterization report dated 07/18/2009 performed at [facility] which showed normal coronary arteries with right dominance.  She currently denies leg swelling, palpitations, orthopnea, and paroxysmal nocturnal dyspnea.  She is here with her sister.  Family history: Mother underwent some form of cardiac surgery and may have had a dilated cardiomyopathy.    Allergies  Allergen Reactions  . Codeine     Stomach Pains  . Demerol [Meperidine]     Upset stomach  . Hydrocodone   . Lortab [Hydrocodone-Acetaminophen]     Upset stomach    Current Outpatient Medications  Medication Sig Dispense Refill  . calcium carbonate (OS-CAL) 600 MG TABS tablet Take 600 mg by mouth 2 (two) times daily with a meal.    . carvedilol (COREG) 6.25 MG tablet Take 6.25 mg by mouth daily.    . cyanocobalamin (,VITAMIN B-12,) 1000 MCG/ML injection Inject 1 mL (1,000 mcg total) into the muscle every 30 (thirty) days. 10 mL 0  . docusate sodium 100 MG CAPS Take 100 mg by mouth daily. 10 capsule 0  . furosemide (LASIX) 20 MG tablet Take 1 tablet (20 mg total) by mouth daily. 30 tablet 3  . letrozole (FEMARA) 2.5 MG tablet Take 1 tablet (2.5 mg total) by mouth daily. 90 tablet 3  . losartan (COZAAR) 50 MG tablet Take 50 mg by mouth daily.    . Multiple Vitamin (MULTIVITAMIN)  tablet Take 1 tablet by mouth daily.    Marland Kitchen venlafaxine XR (EFFEXOR-XR) 37.5 MG 24 hr capsule Take 1 capsule (37.5 mg total) by mouth daily with breakfast. 30 capsule 5  . Vitamin D, Ergocalciferol, (DRISDOL) 50000 UNITS CAPS capsule Take 50,000 Units by mouth every 7 (seven) days.     No current facility-administered medications for this visit.     Past Medical History:  Diagnosis Date  . Anxiety   . Bladder cystocele   . Bundle branch  block, left    diagnosed 03/2012 followed by cath showing only minimal CAD  . Bursitis   . Cancer ([place])    right breast  . Capsulitis   . Diverticulosis   . Elevated liver enzymes 2007  . History of IBS   . Hyperlipidemia   . Hypertension   . pernicious anemia     Past Surgical History:  Procedure Laterality Date  . ANKLE FRACTURE SURGERY    . BREAST BIOPSY     right breast  . BREAST RECONSTRUCTION WITH PLACEMENT OF TISSUE EXPANDER AND FLEX HD (ACELLULAR HYDRATED DERMIS) Right 01/28/2014   Procedure: RIGHT BREAST RECONSTRUCTION WITH PLACEMENT OF TISSUE EXPANDER;  Surgeon: Crissie Reese, MD;  Location: [place];  Service: Plastics;  Laterality: Right;  . CARDIAC CATHETERIZATION     x3; 03/18/12 [facility]): 10-20% pLAD, <20% ostial LCX, 20% mPLA, EF 60%.    . CHOLECYSTECTOMY    . COLONOSCOPY     x4  . DILATION AND CURETTAGE OF UTERUS    . MASTECTOMY Right    malignant  . REDUCTION MAMMAPLASTY Left   . RHINOPLASTY    . SIMPLE MASTECTOMY WITH AXILLARY SENTINEL NODE BIOPSY Right 01/28/2014   Procedure: RIGHT TOTAL MASTECTOMY WITH  RIGHT AXILLARY SENTINEL NODE BIOPSY;  Surgeon: Excell Seltzer, MD;  Location: [place];  Service: General;  Laterality: Right;  . TONSILLECTOMY AND ADENOIDECTOMY    . TUBAL LIGATION      Social History   Socioeconomic History  . Marital status: Married    Spouse name: Not on file  . Number of children: Not on file  . Years of education: Not on file  . Highest education level: Not on file  Occupational History  . Not on file  Social Needs  . Financial resource strain: Not on file  . Food insecurity:    Worry: Not on file    Inability: Not on file  . Transportation needs:    Medical: Not on file    Non-medical: Not on file  Tobacco Use  . Smoking status: Never Smoker  . Smokeless tobacco: Never Used  Substance and Sexual Activity  . Alcohol use: No  . Drug use: No  . Sexual activity: Yes  Lifestyle  . Physical  activity:    Days per week: Not on file    Minutes per session: Not on file  . Stress: Not on file  Relationships  . Social connections:    Talks on phone: Not on file    Gets together: Not on file    Attends religious service: Not on file    Active member of club or organization: Not on file    Attends meetings of clubs or organizations: Not on file    Relationship status: Not on file  . Intimate partner violence:    Fear of current or ex partner: Not on file    Emotionally abused: Not on file    Physically abused: Not on file  Forced sexual activity: Not on file  Other Topics Concern  . Not on file  Social History Narrative  . Not on file      Current Meds  Medication Sig  . calcium carbonate (OS-CAL) 600 MG TABS tablet Take 600 mg by mouth 2 (two) times daily with a meal.  . carvedilol (COREG) 6.25 MG tablet Take 6.25 mg by mouth daily.  . cyanocobalamin (,VITAMIN B-12,) 1000 MCG/ML injection Inject 1 mL (1,000 mcg total) into the muscle every 30 (thirty) days.  Marland Kitchen docusate sodium 100 MG CAPS Take 100 mg by mouth daily.  . furosemide (LASIX) 20 MG tablet Take 1 tablet (20 mg total) by mouth daily.  Marland Kitchen letrozole (FEMARA) 2.5 MG tablet Take 1 tablet (2.5 mg total) by mouth daily.  Marland Kitchen losartan (COZAAR) 50 MG tablet Take 50 mg by mouth daily.  . Multiple Vitamin (MULTIVITAMIN) tablet Take 1 tablet by mouth daily.  Marland Kitchen venlafaxine XR (EFFEXOR-XR) 37.5 MG 24 hr capsule Take 1 capsule (37.5 mg total) by mouth daily with breakfast.  . Vitamin D, Ergocalciferol, (DRISDOL) 50000 UNITS CAPS capsule Take 50,000 Units by mouth every 7 (seven) days.      Review of systems complete and found to be negative unless listed above in HPI    Physical exam Blood pressure 104/80, pulse 96, height 5\' 7"  (1.702 m), weight 183 lb 6.4 oz (83.2 kg), SpO2 96 %. General: NAD Neck: No JVD, no thyromegaly or thyroid nodule.  Lungs: Clear to auscultation bilaterally with normal respiratory  effort. CV: Nondisplaced PMI. Regular rate and rhythm, normal S1/split S2, no S3/S4, no murmur.  No peripheral edema.  No carotid bruit.   Abdomen: Soft, nontender, no distention.  Skin: Intact without lesions or rashes.  Neurologic: Alert and oriented x 3.  Psych: Normal affect. Extremities: No clubbing or cyanosis.  HEENT: Normal.   ECG: Most recent ECG reviewed.   Labs: Lab Results  Component Value Date/Time   K 4.2 06/21/2014 10:35 AM   BUN 9.7 06/21/2014 10:35 AM   CREATININE 0.8 06/21/2014 10:35 AM   ALT <6 06/21/2014 10:35 AM   TSH 2.125 06/21/2014 10:35 AM   HGB 12.0 06/21/2014 10:35 AM     Lipids: Lab Results  Component Value Date/Time   LDLCALC 77 06/21/2014 10:35 AM   CHOL 153 06/21/2014 10:35 AM   TRIG 150 (H) 06/21/2014 10:35 AM   HDL 46 06/21/2014 10:35 AM        ASSESSMENT AND PLAN:  1.  Acute congestive heart failure, unclear etiology (systolic versus diastolic versus combined): She currently appears compensated.  Currently on carvedilol and losartan as well as Lasix 20 mg daily. I will order a 2-D echocardiogram with Doppler to evaluate cardiac structure, function, and regional wall motion.  I will then be able to adjust cardiac medications.  I will also try to obtain any previous echocardiograms from [facility].  2.  Left bundle branch block: This is chronic.  Minimal coronary disease by coronary angiography in 2013.  No coronary artery disease seen by cardiac cath in 2011 as noted above.  3.  Hypertension: Blood pressures controlled on present therapy.  No changes.     Disposition: Follow up in 6 weeks  Signed: Kate Sable, M.D., F.A.C.C.  10/02/2017, 2:18 PM

## 2017-10-22 ENCOUNTER — Other Ambulatory Visit: Payer: Self-pay

## 2017-10-22 ENCOUNTER — Ambulatory Visit (INDEPENDENT_AMBULATORY_CARE_PROVIDER_SITE_OTHER): Payer: BLUE CROSS/BLUE SHIELD

## 2017-10-22 ENCOUNTER — Ambulatory Visit (HOSPITAL_COMMUNITY)
Admission: RE | Admit: 2017-10-22 | Discharge: 2017-10-22 | Disposition: A | Discharge status: Home / Self Care | Payer: BLUE CROSS/BLUE SHIELD | Source: Ambulatory Visit | Attending: Hematology and Oncology | Admitting: Hematology and Oncology

## 2017-10-22 DIAGNOSIS — Z1231 Encounter for screening mammogram for malignant neoplasm of breast: Secondary | ICD-10-CM | POA: Diagnosis not present

## 2017-10-22 DIAGNOSIS — Z9011 Acquired absence of right breast and nipple: Secondary | ICD-10-CM | POA: Diagnosis not present

## 2017-10-22 DIAGNOSIS — I509 Heart failure, unspecified: Secondary | ICD-10-CM

## 2017-10-22 LAB — POCT I-STAT CREATININE: Creatinine, Ser: 0.8 mg/dL (ref 0.44–1.00)

## 2017-10-22 MED ORDER — GADOBENATE DIMEGLUMINE 529 MG/ML IV SOLN
20.0000 mL | Freq: Once | INTRAVENOUS | Status: AC | PRN
  Administered 2017-10-22: 17 mL via INTRAVENOUS

## 2017-10-24 ENCOUNTER — Telehealth: Payer: Self-pay | Admitting: *Deleted

## 2017-10-24 DIAGNOSIS — I509 Heart failure, unspecified: Secondary | ICD-10-CM

## 2017-10-24 DIAGNOSIS — R931 Abnormal findings on diagnostic imaging of heart and coronary circulation: Secondary | ICD-10-CM

## 2017-10-24 MED ORDER — SACUBITRIL-VALSARTAN 24-26 MG PO TABS: 1.0000 | ORAL_TABLET | Freq: Two times a day (BID) | ORAL | 6 refills | Status: DC

## 2017-10-24 MED ORDER — CARVEDILOL 3.125 MG PO TABS: 3.1250 mg | ORAL_TABLET | Freq: Two times a day (BID) | ORAL | 6 refills | Status: DC

## 2017-10-24 NOTE — Telephone Encounter (Signed)
Notes recorded by Laurine Blazer, LPN on 5/48/8301 at 4:15 PM EDT Patient notified and verbalized understanding. Copy to pmd. [facility] [place] [place]. Stated that she has been on the [place] since 2013. She agrees to seeing EP. Follow up scheduled for 11/20/2017 with Dr. Bronson Ing.   ------  Notes recorded by Herminio Commons, MD on 10/22/2017 at 5:04 PM EDT Pumping function is severely reduced. Please find out how long she has been on carvedilol and losartan. I would have her take carvedilol 3.125 mg twice daily. Switch losartan to [facility] after a 36-hour washout period at a dose of 24/26 mg twice daily. If she has been on carvedilol and losartan for several months, I would recommend she be referred to EP for cardiac resynchronization therapy and ICD candidacy.

## 2017-10-28 ENCOUNTER — Telehealth: Payer: Self-pay | Admitting: *Deleted

## 2017-10-28 ENCOUNTER — Telehealth: Payer: Self-pay | Admitting: Cardiology

## 2017-10-28 MED ORDER — SACUBITRIL-VALSARTAN 24-26 MG PO TABS: 1.0000 | ORAL_TABLET | Freq: Two times a day (BID) | ORAL | 6 refills | Status: DC

## 2017-10-28 MED ORDER — SACUBITRIL-VALSARTAN 24-26 MG PO TABS: 1.0000 | ORAL_TABLET | Freq: Two times a day (BID) | ORAL | 0 refills | Status: DC

## 2017-10-28 NOTE — Telephone Encounter (Signed)
Left message to return call 

## 2017-10-28 NOTE — Telephone Encounter (Signed)
[employer] approved Entresto 24/26mg  through cover my meds.  Start date:  09/28/2017 & end date:  10/28/2018.   [facility] notified.  Stated it is showing an $80.00 copay today.  Will give patient free 30 day trial card & $10.00 co-pay card as she has Pharmacist, community.

## 2017-10-28 NOTE — Telephone Encounter (Signed)
Please call patient regarding medication questions.  /tg

## 2017-10-28 NOTE — Telephone Encounter (Signed)
Returned pt call. She states that she was contacted by her pharmacy advised that they denied her entresto. I advised her that she could pick up some samples from the [place] office until we can get an approval from insurance.

## 2017-10-29 NOTE — Telephone Encounter (Signed)
10-29-17 called pt to make referral to [facility], returning call regarding Entresto--pls call  [phone]

## 2017-10-29 NOTE — Telephone Encounter (Signed)
Patient notified earlier today.  Cards placed in mail at her request.

## 2017-11-04 NOTE — Telephone Encounter (Signed)
See note by Orson Slick dated 10/28/17, states Loretta Thomas was approved

## 2017-11-07 ENCOUNTER — Telehealth: Payer: Self-pay | Admitting: Cardiovascular Disease

## 2017-11-07 NOTE — Telephone Encounter (Signed)
Pt says she had recent tick bites that have turned into rashes and spreading over her neck and  - thought it may medication related - suggested pt schedule appt with pcp with recent tick bites and rashes. Pt agreeable

## 2017-11-07 NOTE — Telephone Encounter (Signed)
Patient [place] at beach on [place] and has broken out in rash around neck that are itching.  Was not sure if it could be from medication reaction

## 2017-11-15 NOTE — Telephone Encounter (Signed)
Patient went to doctor and was told she does not have issues with Tick bites and was told to contact us back

## 2017-11-15 NOTE — Telephone Encounter (Signed)
Pt says she went to pcp and was negative for Lyme disease and has UTI - says she is covered in rashes from neck down - says she was told Delene Loll could cause rashes in 1% of people taking it and told to contact her cardiologist

## 2017-11-15 NOTE — Telephone Encounter (Signed)
Rather than stopping Entresto, I would recommend trying an antihistamine such as cetirizine for 1 to 2 weeks to see if it resolves.

## 2017-11-15 NOTE — Telephone Encounter (Signed)
Pt says she has loratadine at home 10 mg and will try that - has appt with Dr Bronson Ing next Wednesday

## 2017-11-20 ENCOUNTER — Encounter: Payer: Self-pay | Admitting: Cardiovascular Disease

## 2017-11-20 ENCOUNTER — Ambulatory Visit (INDEPENDENT_AMBULATORY_CARE_PROVIDER_SITE_OTHER): Payer: BLUE CROSS/BLUE SHIELD | Admitting: Cardiovascular Disease

## 2017-11-20 VITALS — BP 102/70 | HR 91 | Ht 67.0 in | Wt 181.0 lb

## 2017-11-20 DIAGNOSIS — I5042 Chronic combined systolic (congestive) and diastolic (congestive) heart failure: Secondary | ICD-10-CM

## 2017-11-20 DIAGNOSIS — I10 Essential (primary) hypertension: Secondary | ICD-10-CM

## 2017-11-20 DIAGNOSIS — I447 Left bundle-branch block, unspecified: Secondary | ICD-10-CM | POA: Diagnosis not present

## 2017-11-20 DIAGNOSIS — I5189 Other ill-defined heart diseases: Secondary | ICD-10-CM

## 2017-11-20 DIAGNOSIS — I519 Heart disease, unspecified: Secondary | ICD-10-CM

## 2017-11-20 MED ORDER — POTASSIUM CHLORIDE CRYS ER 20 MEQ PO TBCR: 20.0000 meq | EXTENDED_RELEASE_TABLET | Freq: Every day | ORAL | 0 refills | Status: DC

## 2017-11-20 MED ORDER — LOSARTAN POTASSIUM 50 MG PO TABS: 50.0000 mg | ORAL_TABLET | Freq: Every day | ORAL | 0 refills | Status: DC

## 2017-11-20 MED ORDER — FUROSEMIDE 40 MG PO TABS: 40.0000 mg | ORAL_TABLET | Freq: Every day | ORAL | 0 refills | Status: DC

## 2017-11-20 NOTE — Patient Instructions (Addendum)
Medication Instructions:  Your physician has recommended you make the following change in your medication:    INCREASE Lasix to 40 mg daily   START Potassium 20 meq daily   START Losartan 50 mg daily   Please continue all other medications as prescribed  Labwork: BMET In 1 week Orders given today   Testing/Procedures: NONE  Follow-Up: Your physician recommends that you schedule a follow-up appointment in: 3 MONTHS   Any Other Special Instructions Will Be Listed Below (If Applicable).  If you need a refill on your cardiac medications before your next appointment, please call your pharmacy.

## 2017-11-20 NOTE — Progress Notes (Signed)
SUBJECTIVE: The patient presents for follow-up of CHF.  Echocardiogram 10/22/2017 showed severely reduced left ventricular systolic function, LVEF 15 to 20%, mild to moderate left ventricular enlargement, mild LVH, diffuse hypokinesis with regional wall variation, moderate mitral and mild tricuspid regurgitation.  There was mild to moderate pulmonary hypertension.  She had mild nonobstructive coronary disease by coronary angiography in December 2013 in [place], [place].  She denies chest pain.  She cannot speak to shortness of breath as she does not get any significant activity.  She has developed a rash over the upper part of her chest and on her thighs and legs after starting Entresto.  She had been on carvedilol and losartan since 2013.  She has a mild cough and also has developed some abdominal distention.  She denies orthopnea, leg swelling, and paroxysmal nocturnal dyspnea.      Review of Systems: As per "subjective", otherwise negative.  Allergies  Allergen Reactions  . Codeine     Stomach Pains  . Demerol [Meperidine]     Upset stomach  . Hydrocodone   . Lortab [Hydrocodone-Acetaminophen]     Upset stomach    Current Outpatient Medications  Medication Sig Dispense Refill  . calcium carbonate (OS-CAL) 600 MG TABS tablet Take 600 mg by mouth 2 (two) times daily with a meal.    . carvedilol (COREG) 3.125 MG tablet Take 1 tablet (3.125 mg total) by mouth 2 (two) times daily with a meal. 60 tablet 6  . cyanocobalamin (,VITAMIN B-12,) 1000 MCG/ML injection Inject 1 mL (1,000 mcg total) into the muscle every 30 (thirty) days. 10 mL 0  . docusate sodium 100 MG CAPS Take 100 mg by mouth daily. 10 capsule 0  . furosemide (LASIX) 20 MG tablet Take 1 tablet (20 mg total) by mouth daily. 30 tablet 3  . letrozole (FEMARA) 2.5 MG tablet Take 1 tablet (2.5 mg total) by mouth daily. 90 tablet 3  . Multiple Vitamin (MULTIVITAMIN) tablet Take 1 tablet by mouth daily.    .  nitrofurantoin, macrocrystal-monohydrate, (MACROBID) 100 MG capsule Take 100 mg by mouth 2 (two) times daily.    . sacubitril-valsartan (ENTRESTO) 24-26 MG Take 1 tablet by mouth 2 (two) times daily. 60 tablet 0  . venlafaxine XR (EFFEXOR-XR) 37.5 MG 24 hr capsule Take 1 capsule (37.5 mg total) by mouth daily with breakfast. 30 capsule 5  . Vitamin D, Ergocalciferol, (DRISDOL) 50000 UNITS CAPS capsule Take 50,000 Units by mouth every 7 (seven) days.     No current facility-administered medications for this visit.     Past Medical History:  Diagnosis Date  . Anxiety   . Bladder cystocele   . Bundle branch block, left    diagnosed 03/2012 followed by cath showing only minimal CAD  . Bursitis   . Cancer ([place])    right breast  . Capsulitis   . Diverticulosis   . Elevated liver enzymes 2007  . History of IBS   . Hyperlipidemia   . Hypertension   . pernicious anemia     Past Surgical History:  Procedure Laterality Date  . ANKLE FRACTURE SURGERY    . BREAST BIOPSY     right breast  . BREAST RECONSTRUCTION WITH PLACEMENT OF TISSUE EXPANDER AND FLEX HD (ACELLULAR HYDRATED DERMIS) Right 01/28/2014   Procedure: RIGHT BREAST RECONSTRUCTION WITH PLACEMENT OF TISSUE EXPANDER;  Surgeon: Crissie Reese, MD;  Location: [place];  Service: Plastics;  Laterality: Right;  . CARDIAC CATHETERIZATION  x3; 03/18/12 [facility]): 10-20% pLAD, <20% ostial LCX, 20% mPLA, EF 60%.    . CHOLECYSTECTOMY    . COLONOSCOPY     x4  . DILATION AND CURETTAGE OF UTERUS    . MASTECTOMY Right    malignant  . REDUCTION MAMMAPLASTY Left   . RHINOPLASTY    . SIMPLE MASTECTOMY WITH AXILLARY SENTINEL NODE BIOPSY Right 01/28/2014   Procedure: RIGHT TOTAL MASTECTOMY WITH  RIGHT AXILLARY SENTINEL NODE BIOPSY;  Surgeon: Excell Seltzer, MD;  Location: [place];  Service: General;  Laterality: Right;  . TONSILLECTOMY AND ADENOIDECTOMY    . TUBAL LIGATION      Social History   Socioeconomic History    . Marital status: Married    Spouse name: Not on file  . Number of children: Not on file  . Years of education: Not on file  . Highest education level: Not on file  Occupational History  . Not on file  Social Needs  . Financial resource strain: Not on file  . Food insecurity:    Worry: Not on file    Inability: Not on file  . Transportation needs:    Medical: Not on file    Non-medical: Not on file  Tobacco Use  . Smoking status: Never Smoker  . Smokeless tobacco: Never Used  Substance and Sexual Activity  . Alcohol use: No  . Drug use: No  . Sexual activity: Yes  Lifestyle  . Physical activity:    Days per week: Not on file    Minutes per session: Not on file  . Stress: Not on file  Relationships  . Social connections:    Talks on phone: Not on file    Gets together: Not on file    Attends religious service: Not on file    Active member of club or organization: Not on file    Attends meetings of clubs or organizations: Not on file    Relationship status: Not on file  . Intimate partner violence:    Fear of current or ex partner: Not on file    Emotionally abused: Not on file    Physically abused: Not on file    Forced sexual activity: Not on file  Other Topics Concern  . Not on file  Social History Narrative  . Not on file     Vitals:   11/20/17 1256  BP: 102/70  Pulse: 91  SpO2: 98%  Weight: 181 lb (82.1 kg)  Height: 5\' 7"  (1.702 m)    Wt Readings from Last 3 Encounters:  11/20/17 181 lb (82.1 kg)  10/02/17 183 lb 6.4 oz (83.2 kg)  05/07/17 188 lb 14.4 oz (85.7 kg)     PHYSICAL EXAM General: NAD HEENT: Normal. Neck: No JVD, no thyromegaly. Lungs: Clear to auscultation bilaterally with normal respiratory effort. CV: Regular rate and rhythm, normal S1/split S2, no S3/S4, no murmur. No pretibial or periankle edema.   Abdomen: Soft, nontender, mild distention.  Neurologic: Alert and oriented.  Psych: Normal affect. Skin:  Normal. Musculoskeletal: No gross deformities.    ECG: Reviewed above under Subjective   Labs: Lab Results  Component Value Date/Time   K 4.2 06/21/2014 10:35 AM   BUN 9.7 06/21/2014 10:35 AM   CREATININE 0.80 10/22/2017 10:23 AM   CREATININE 0.8 06/21/2014 10:35 AM   ALT <6 06/21/2014 10:35 AM   TSH 2.125 06/21/2014 10:35 AM   HGB 12.0 06/21/2014 10:35 AM     Lipids: Lab Results  Component Value Date/Time   LDLCALC 77 06/21/2014 10:35 AM   CHOL 153 06/21/2014 10:35 AM   TRIG 150 (H) 06/21/2014 10:35 AM   HDL 46 06/21/2014 10:35 AM       ASSESSMENT AND PLAN: 1.  Chronic systolic heart failure, LVEF 15 to 20%: This appears to be nonischemic in etiology.  Currently on carvedilol and Entresto as well as Lasix 20 mg daily.  She developed a rash with Delene Loll so I will discontinue this and resume losartan 50 mg daily.  She has some abdominal distention which is likely due to fluid buildup.  I will increase Lasix to 40 mg daily and add potassium 20 milliequivalents daily.  I will check a basic metabolic panel within the next several days.  I have already made a referral to EP (Dr. Rayann Heman) and she sees him on August 19 for cardiac resynchronization therapy and ICD evaluation.  2.  Left bundle branch block: Chronic.  QRS duration 163 ms on 09/29/2017.  She would benefit from cardiac resynchronization therapy.  She sees EP on August 19.  Minimal coronary artery disease by coronary angiography in 2013.  3.  Hypertension: Blood pressure is normal.  I will monitor given discontinuation of Entresto and resumption of losartan.    Disposition: Follow up with Dr. Rayann Heman on August 19.  Follow-up with general cardiology in 3 months.  Time spent: 40 minutes, of which greater than 50% was spent reviewing symptoms, relevant blood tests and studies, and discussing management plan with the patient.    Kate Sable, M.D., F.A.C.C.

## 2017-11-29 ENCOUNTER — Telehealth: Payer: Self-pay | Admitting: Cardiovascular Disease

## 2017-11-29 NOTE — Telephone Encounter (Signed)
Patient notified we have not received labs. Labs were requested. Will call patient with results once we have them. Patient verbalized understanding

## 2017-11-29 NOTE — Telephone Encounter (Signed)
Patient called wanting to know about lab results she recently had done at [facility].

## 2017-12-02 ENCOUNTER — Encounter: Payer: Self-pay | Admitting: Internal Medicine

## 2017-12-02 ENCOUNTER — Ambulatory Visit (INDEPENDENT_AMBULATORY_CARE_PROVIDER_SITE_OTHER): Payer: BLUE CROSS/BLUE SHIELD | Admitting: Internal Medicine

## 2017-12-02 VITALS — BP 104/70 | HR 90 | Ht 67.0 in | Wt 182.0 lb

## 2017-12-02 DIAGNOSIS — I10 Essential (primary) hypertension: Secondary | ICD-10-CM | POA: Diagnosis not present

## 2017-12-02 DIAGNOSIS — I447 Left bundle-branch block, unspecified: Secondary | ICD-10-CM

## 2017-12-02 DIAGNOSIS — I5042 Chronic combined systolic (congestive) and diastolic (congestive) heart failure: Secondary | ICD-10-CM | POA: Diagnosis not present

## 2017-12-02 NOTE — Patient Instructions (Addendum)
Medication Instructions:  Your physician recommends that you continue on your current medications as directed. Please refer to the Current Medication list given to you today.  Labwork: You will get lab work on December 30, 2017 at our [place] can come in any time- you do not need to be fasting.   Testing/Procedures: Your physician has recommended that you have a defibrillator inserted. An implantable cardioverter defibrillator (ICD) is a small device that is placed in your chest or, in rare cases, your abdomen. This device uses electrical pulses or shocks to help control life-threatening, irregular heartbeats that could lead the heart to suddenly stop beating (sudden cardiac arrest). Leads are attached to the ICD that goes into your heart. This is done in the hospital and usually requires an overnight stay. Please see the instruction sheet given to you today for more information.  Follow-Up: You will follow up with device clinic 10-14 days after your procedure for a wound check.  You will follow up with Dr. Rayann Heman 91 days after your procedure.  Any Other Special Instructions Will Be Listed Below (If Applicable).  Please arrive at the [facility] main entrance of [facility] at:  9:30 am on January 03, 2018 Use the CHG surgical scrub as directed Do not eat or drink after midnight prior to procedure Do not take any medications the morning of the procedure Plan for one night stay You will need someone to drive you home at discharge  If you need a refill on your cardiac medications before your next appointment, please call your pharmacy.    Cardioverter Defibrillator Implantation An implantable cardioverter defibrillator (ICD) is a small device that is placed under the skin in the chest or abdomen. An ICD consists of a battery, a small computer (pulse generator), and wires (leads) that go into the heart. An ICD is used to detect and correct two types of dangerous  irregular heartbeats (arrhythmias):  A rapid heart rhythm (tachycardia).  An arrhythmia in which the lower chambers of the heart (ventricles) contract in an uncoordinated way (fibrillation).  When an ICD detects tachycardia, it sends a low-energy shock to the heart to restore the heartbeat to normal (cardioversion). This signal is usually painless. If cardioversion does not work or if the ICD detects fibrillation, it delivers a high-energy shock to the heart (defibrillation) to restart the heart. This shock may feel like a strong jolt in the chest. Your health care provider may prescribe an ICD if:  You have had an arrhythmia that originated in the ventricles.  Your heart has been damaged by a disease or heart condition.  Sometimes, ICDs are programmed to act as a device called a pacemaker. Pacemakers can be used to treat a slow heartbeat (bradycardia) or tachycardia by taking over the heart rate with electrical impulses. Tell a health care provider about:  Any allergies you have.  All medicines you are taking, including vitamins, herbs, eye drops, creams, and over-the-counter medicines.  Any problems you or family members have had with anesthetic medicines.  Any blood disorders you have.  Any surgeries you have had.  Any medical conditions you have.  Whether you are pregnant or may be pregnant. What are the risks? Generally, this is a safe procedure. However, problems may occur, including:  Swelling, bleeding, or bruising.  Infection.  Blood clots.  Damage to other structures or organs, such as nerves, blood vessels, or the heart.  Allergic reactions to medicines used during the procedure.  What happens before the procedure? Staying hydrated Follow instructions from your health care provider about hydration, which may include:  Up to 2 hours before the procedure - you may continue to drink clear liquids, such as water, clear fruit juice, black coffee, and plain  tea.  Eating and drinking restrictions Follow instructions from your health care provider about eating and drinking, which may include:  8 hours before the procedure - stop eating heavy meals or foods such as meat, fried foods, or fatty foods.  6 hours before the procedure - stop eating light meals or foods, such as toast or cereal.  6 hours before the procedure - stop drinking milk or drinks that contain milk.  2 hours before the procedure - stop drinking clear liquids.  Medicine Ask your health care provider about:  Changing or stopping your normal medicines. This is important if you take diabetes medicines or blood thinners.  Taking medicines such as aspirin and ibuprofen. These medicines can thin your blood. Do not take these medicines before your procedure if your doctor tells you not to.  Tests  You may have blood tests.  You may have a test to check the electrical signals in your heart (electrocardiogram, ECG).  You may have imaging tests, such as a chest X-ray. General instructions  For 24 hours before the procedure, stop using products that contain nicotine or tobacco, such as cigarettes and e-cigarettes. If you need help quitting, ask your health care provider.  Plan to have someone take you home from the hospital or clinic.  You may be asked to shower with a germ-killing soap. What happens during the procedure?  To reduce your risk of infection: ? Your health care team will wash or sanitize their hands. ? Your skin will be washed with soap. ? Hair may be removed from the surgical area.  Small monitors will be put on your body. They will be used to check your heart, blood pressure, and oxygen level.  An IV tube will be inserted into one of your veins.  You will be given one or more of the following: ? A medicine to help you relax (sedative). ? A medicine to numb the area (local anesthetic). ? A medicine to make you fall asleep (general anesthetic).  Leads  will be guided through a blood vessel into your heart and attached to your heart muscles. Depending on the ICD, the leads may go into one ventricle or they may go into both ventricles and into an upper chamber of the heart. An X-ray machine (fluoroscope) will be usedto help guide the leads.  A small incision will be made to create a deep pocket under your skin.  The pulse generator will be placed into the pocket.  The ICD will be tested.  The incision will be closed with stitches (sutures), skin glue, or staples.  A bandage (dressing) will be placed over the incision. This procedure may vary among health care providers and hospitals. What happens after the procedure?  Your blood pressure, heart rate, breathing rate, and blood oxygen level will be monitored often until the medicines you were given have worn off.  A chest X-ray will be taken to check that the ICD is in the right place.  You will need to stay in the hospital for 1-2 days so your health care provider can make sure your ICD is working.  Do not drive for 24 hours if you received a sedative. Ask your health care provider when it is  safe for you to drive.  You may be given an identification card explaining that you have an ICD. Summary  An implantable cardioverter defibrillator (ICD) is a small device that is placed under the skin in the chest or abdomen. It is used to detect and correct dangerous irregular heartbeats (arrhythmias).  An ICD consists of a battery, a small computer (pulse generator), and wires (leads) that go into the heart.  When an ICD detects rapid heart rhythm (tachycardia), it sends a low-energy shock to the heart to restore the heartbeat to normal (cardioversion). If cardioversion does not work or if the ICD detects uncoordinated heart contractions (fibrillation), it delivers a high-energy shock to the heart (defibrillation) to restart the heart.  You will need to stay in the hospital for 1-2 days to make  sure your ICD is working. This information is not intended to replace advice given to you by your health care provider. Make sure you discuss any questions you have with your health care provider. Document Released: 12/23/2001 Document Revised: 04/11/2016 Document Reviewed: 04/11/2016 Elsevier Interactive Patient Education  2017 [employer].

## 2017-12-02 NOTE — Progress Notes (Signed)
Electrophysiology Office Note   Date:  12/02/2017   ID:  Loretta Thomas, DOB Jul 13, 1950, MRN [id number]  PCP:  Rossie Muskrat, DO  Cardiologist:  Dr Bronson Ing Primary Electrophysiologist: Thompson Grayer, MD    CC: CHF, LBBB   History of Present Illness: Loretta Thomas is a 67 y.o. female who presents today for electrophysiology evaluation.   She has a h/o nonischemic CM, LBBB, and chronic NYHA Class III CHF.  She presents today for EP consultation regarding risks of sudden death.  She is referred by Dr Bronson Ing. She has had CHF for several years.  She has been treated with beta blockers and ARB for "years".  Prior cath 07/18/2009 from [facility] revealed no significant CAD.  She has had several hospitalizations for CHF.  She has been treated with optimal medical therapy but continues to have reduced EF.  She reports fatigue and weakness.  She has chronic SOB with moderate activity.  + orthopnea.  Today, she denies symptoms of palpitations, chest pain,  lower extremity edema, claudication, dizziness, presyncope, syncope, bleeding, or neurologic sequela. The patient is tolerating medications without difficulties and is otherwise without complaint today.    Past Medical History:  Diagnosis Date  . Anxiety   . Bladder cystocele   . Breast cancer ([place])    s/p L breast cancer removal  . Bundle branch block, left    diagnosed 03/2012 followed by cath showing only minimal CAD  . Bursitis   . Cancer ([place])    right breast  . Capsulitis   . Diverticulosis   . Elevated liver enzymes 2007  . History of IBS   . Hyperlipidemia   . Hypertension   . Nonischemic cardiomyopathy ([place])   . pernicious anemia    Past Surgical History:  Procedure Laterality Date  . ANKLE FRACTURE SURGERY    . BREAST BIOPSY     right breast  . BREAST RECONSTRUCTION WITH PLACEMENT OF TISSUE EXPANDER AND FLEX HD (ACELLULAR HYDRATED DERMIS) Right 01/28/2014   Procedure: RIGHT BREAST RECONSTRUCTION WITH  PLACEMENT OF TISSUE EXPANDER;  Surgeon: Crissie Reese, MD;  Location: [place];  Service: Plastics;  Laterality: Right;  . CARDIAC CATHETERIZATION     x3; 03/18/12 [facility]): 10-20% pLAD, <20% ostial LCX, 20% mPLA, EF 60%.    . CHOLECYSTECTOMY    . COLONOSCOPY     x4  . DILATION AND CURETTAGE OF UTERUS    . MASTECTOMY Right    malignant  . REDUCTION MAMMAPLASTY Left   . RHINOPLASTY    . SIMPLE MASTECTOMY WITH AXILLARY SENTINEL NODE BIOPSY Right 01/28/2014   Procedure: RIGHT TOTAL MASTECTOMY WITH  RIGHT AXILLARY SENTINEL NODE BIOPSY;  Surgeon: Excell Seltzer, MD;  Location: [facility];  Service: General;  Laterality: Right;  . TONSILLECTOMY AND ADENOIDECTOMY    . TUBAL LIGATION       Current Outpatient Medications  Medication Sig Dispense Refill  . calcium carbonate (OS-CAL) 600 MG TABS tablet Take 600 mg by mouth 2 (two) times daily with a meal.    . carvedilol (COREG) 3.125 MG tablet Take 1 tablet (3.125 mg total) by mouth 2 (two) times daily with a meal. 60 tablet 6  . cyanocobalamin (,VITAMIN B-12,) 1000 MCG/ML injection Inject 1 mL (1,000 mcg total) into the muscle every 30 (thirty) days. 10 mL 0  . docusate sodium 100 MG CAPS Take 100 mg by mouth daily. 10 capsule 0  . furosemide (LASIX) 40 MG tablet Take 1 tablet (40 mg total)  by mouth daily. 90 tablet 0  . letrozole (FEMARA) 2.5 MG tablet Take 1 tablet (2.5 mg total) by mouth daily. 90 tablet 3  . losartan (COZAAR) 50 MG tablet Take 1 tablet (50 mg total) by mouth daily. 90 tablet 0  . nystatin ointment (MYCOSTATIN) Apply 1 application topically as needed for itching.    . potassium chloride SA (K-DUR,KLOR-CON) 20 MEQ tablet Take 1 tablet (20 mEq total) by mouth daily. 90 tablet 0  . venlafaxine XR (EFFEXOR-XR) 37.5 MG 24 hr capsule Take 1 capsule (37.5 mg total) by mouth daily with breakfast. 30 capsule 5  . Vitamin D, Ergocalciferol, (DRISDOL) 50000 UNITS CAPS capsule Take 50,000 Units by mouth every 7 (seven)  days.     No current facility-administered medications for this visit.     Allergies:   Codeine; Demerol [meperidine]; Hydrocodone; and Lortab [hydrocodone-acetaminophen]   Social History:  The patient  reports that she has never smoked. She has never used smokeless tobacco. She reports that she does not drink alcohol or use drugs.   Family History:  The patient's family history includes Breast cancer (age of onset: 50) in her cousin; Breast cancer (age of onset: 29) in her maternal aunt; Cancer in her brother and sister; Cancer (age of onset: 19) in her cousin; Cancer (age of onset: 70) in her maternal aunt; Cancer (age of onset: 33) in her maternal grandmother; Cancer (age of onset: 23) in her paternal grandfather; Cancer (age of onset: 48) in her father.    ROS:  Please see the history of present illness.   All other systems are personally reviewed and negative.    PHYSICAL EXAM: VS:  BP 104/70   Pulse 90   Ht 5\' 7"  (1.702 m)   Wt 182 lb (82.6 kg)   SpO2 97%   BMI 28.51 kg/m  , BMI Body mass index is 28.51 kg/m. GEN: Well nourished, well developed, in no acute distress  HEENT: normal  Neck: no JVD, carotid bruits, or masses Cardiac: RRR; no murmurs, rubs, or gallops,no edema  Respiratory:  clear to auscultation bilaterally, normal work of breathing GI: soft, nontender, nondistended, + BS MS: no deformity or atrophy  Skin: warm and dry  Neuro:  Strength and sensation are intact Psych: euthymic mood, full affect  EKG:  EKG is ordered today. The ekg ordered today is personally reviewed and shows sinus rhythm 90 bpm, LBBB (QRS 156 msec)   Recent Labs: 10/22/2017: Creatinine, Ser 0.80  personally reviewed   Lipid Panel     Component Value Date/Time   CHOL 153 06/21/2014 1035   TRIG 150 (H) 06/21/2014 1035   HDL 46 06/21/2014 1035   CHOLHDL 3.3 06/21/2014 1035   VLDL 30 06/21/2014 1035   LDLCALC 77 06/21/2014 1035   personally reviewed   Wt Readings from Last 3  Encounters:  12/02/17 182 lb (82.6 kg)  11/20/17 181 lb (82.1 kg)  10/02/17 183 lb 6.4 oz (83.2 kg)      Other studies personally reviewed: Additional studies/ records that were reviewed today include: Dr Court Joy notes  Review of the above records today demonstrates: as above  Echo 10/22/17 reveals EF 15%, mild LVH, global HK, moderate MR   ASSESSMENT AND PLAN:  1.  Nonischemic CM/ LBBB The patient has a nonischemic CM (EF 15%), NYHA Class III CHF, and LBBB.  She is referred by Dr Bronson Ing for risk stratification of sudden death and consideration of ICD implantation.  At this time, she meets  MADIT II/ SCD-HeFT criteria for ICD implantation for primary prevention of sudden death. Given QRS >150 msec, she may benefit from CRT.   I have had a thorough discussion with the patient reviewing options.  The patient and their family have had opportunities to ask questions and have them answered. The patient and I have decided together through a shared decision making process to BiV ICD at this time.   Risks, benefits, alternatives to BiV ICD implantation were discussed in detail with the patient today. The patient understands that the risks include but are not limited to bleeding, infection, pneumothorax, perforation, tamponade, vascular damage, renal failure, MI, stroke, death, inappropriate shocks, and lead dislodgement and wishes to proceed.  We will therefore schedule device implantation at the next available time.   Current medicines are reviewed at length with the patient today.   The patient does not have concerns regarding her medicines.  The following changes were made today:  none    Signed, Thompson Grayer, MD  12/02/2017 4:15 PM     [place] [address] [phone] (office) [phone] (fax)

## 2017-12-06 ENCOUNTER — Telehealth: Payer: Self-pay | Admitting: Cardiovascular Disease

## 2017-12-06 NOTE — Telephone Encounter (Signed)
Pt would like lab results (scanned in on 8/20) also wants DMV handicap form filled out - explained that Dr Bronson Ing was out of office unitl mOnday and would route message and form on his desk

## 2017-12-06 NOTE — Telephone Encounter (Signed)
Patient called to stay that [place] DMV form will work for her to get handicap sticker.

## 2017-12-07 NOTE — Telephone Encounter (Signed)
Labs look good. I'll fill out form.

## 2017-12-09 NOTE — Telephone Encounter (Signed)
Pt made aware and appreciative. 

## 2017-12-09 NOTE — Telephone Encounter (Signed)
LM to return call.

## 2017-12-18 ENCOUNTER — Telehealth: Payer: Self-pay | Admitting: Internal Medicine

## 2017-12-18 NOTE — Telephone Encounter (Signed)
New Message    Patient is calling because she is due to have a procedure around the 20th of September. She states that she has recently been diagnosis with alpha-gal syndrome. Please call to discuss.

## 2017-12-20 NOTE — Telephone Encounter (Signed)
LMTCB

## 2017-12-20 NOTE — Telephone Encounter (Signed)
Call back received from Pt.  Advised Dr. Rayann Heman made aware of new diagnosis and no change recommended to current plan.

## 2017-12-30 ENCOUNTER — Other Ambulatory Visit: Payer: BLUE CROSS/BLUE SHIELD | Admitting: *Deleted

## 2017-12-30 DIAGNOSIS — I447 Left bundle-branch block, unspecified: Secondary | ICD-10-CM

## 2017-12-30 DIAGNOSIS — I10 Essential (primary) hypertension: Secondary | ICD-10-CM

## 2017-12-30 DIAGNOSIS — I5042 Chronic combined systolic (congestive) and diastolic (congestive) heart failure: Secondary | ICD-10-CM

## 2017-12-30 LAB — CBC WITH DIFFERENTIAL/PLATELET
Basophils Absolute: 0 10*3/uL (ref 0.0–0.2)
Basos: 0 %
EOS (ABSOLUTE): 0.1 10*3/uL (ref 0.0–0.4)
EOS: 2 %
HEMATOCRIT: 35.9 % (ref 34.0–46.6)
HEMOGLOBIN: 12.3 g/dL (ref 11.1–15.9)
Immature Grans (Abs): 0 10*3/uL (ref 0.0–0.1)
Immature Granulocytes: 0 %
LYMPHS ABS: 1.7 10*3/uL (ref 0.7–3.1)
Lymphs: 26 %
MCH: 30.7 pg (ref 26.6–33.0)
MCHC: 34.3 g/dL (ref 31.5–35.7)
MCV: 90 fL (ref 79–97)
MONOCYTES: 5 %
Monocytes Absolute: 0.4 10*3/uL (ref 0.1–0.9)
Neutrophils Absolute: 4.2 10*3/uL (ref 1.4–7.0)
Neutrophils: 67 %
Platelets: 212 10*3/uL (ref 150–450)
RBC: 4.01 x10E6/uL (ref 3.77–5.28)
RDW: 14.3 % (ref 12.3–15.4)
WBC: 6.5 10*3/uL (ref 3.4–10.8)

## 2017-12-30 LAB — BASIC METABOLIC PANEL
BUN/Creatinine Ratio: 15 (ref 12–28)
BUN: 12 mg/dL (ref 8–27)
CO2: 27 mmol/L (ref 20–29)
CREATININE: 0.81 mg/dL (ref 0.57–1.00)
Calcium: 9.8 mg/dL (ref 8.7–10.3)
Chloride: 101 mmol/L (ref 96–106)
GFR calc Af Amer: 88 mL/min/{1.73_m2} (ref >59)
GFR, EST NON AFRICAN AMERICAN: 76 mL/min/{1.73_m2} (ref >59)
Glucose: 101 mg/dL — ABNORMAL HIGH (ref 65–99)
Potassium: 4.1 mmol/L (ref 3.5–5.2)
SODIUM: 143 mmol/L (ref 134–144)

## 2018-01-03 ENCOUNTER — Encounter (HOSPITAL_COMMUNITY)
Admission: RE | Disposition: A | Discharge status: Home / Self Care | Payer: Self-pay | Source: Ambulatory Visit | Attending: Internal Medicine

## 2018-01-03 ENCOUNTER — Encounter (HOSPITAL_COMMUNITY): Payer: Self-pay | Admitting: Internal Medicine

## 2018-01-03 ENCOUNTER — Other Ambulatory Visit: Payer: Self-pay

## 2018-01-03 ENCOUNTER — Ambulatory Visit (HOSPITAL_COMMUNITY)
Admission: RE | Admit: 2018-01-03 | Discharge: 2018-01-04 | Disposition: A | Discharge status: Home / Self Care | Payer: BLUE CROSS/BLUE SHIELD | Source: Ambulatory Visit | Attending: Internal Medicine | Admitting: Internal Medicine

## 2018-01-03 DIAGNOSIS — I509 Heart failure, unspecified: Secondary | ICD-10-CM | POA: Diagnosis not present

## 2018-01-03 DIAGNOSIS — Z803 Family history of malignant neoplasm of breast: Secondary | ICD-10-CM | POA: Insufficient documentation

## 2018-01-03 DIAGNOSIS — Z853 Personal history of malignant neoplasm of breast: Secondary | ICD-10-CM | POA: Diagnosis not present

## 2018-01-03 DIAGNOSIS — I429 Cardiomyopathy, unspecified: Secondary | ICD-10-CM | POA: Diagnosis not present

## 2018-01-03 DIAGNOSIS — Z809 Family history of malignant neoplasm, unspecified: Secondary | ICD-10-CM | POA: Insufficient documentation

## 2018-01-03 DIAGNOSIS — Z9049 Acquired absence of other specified parts of digestive tract: Secondary | ICD-10-CM | POA: Diagnosis not present

## 2018-01-03 DIAGNOSIS — Z79899 Other long term (current) drug therapy: Secondary | ICD-10-CM | POA: Insufficient documentation

## 2018-01-03 DIAGNOSIS — I2510 Atherosclerotic heart disease of native coronary artery without angina pectoris: Secondary | ICD-10-CM | POA: Diagnosis not present

## 2018-01-03 DIAGNOSIS — Z9011 Acquired absence of right breast and nipple: Secondary | ICD-10-CM | POA: Diagnosis not present

## 2018-01-03 DIAGNOSIS — Z9889 Other specified postprocedural states: Secondary | ICD-10-CM | POA: Diagnosis not present

## 2018-01-03 DIAGNOSIS — I110 Hypertensive heart disease with heart failure: Secondary | ICD-10-CM | POA: Diagnosis not present

## 2018-01-03 DIAGNOSIS — I447 Left bundle-branch block, unspecified: Secondary | ICD-10-CM | POA: Diagnosis not present

## 2018-01-03 DIAGNOSIS — Z9851 Tubal ligation status: Secondary | ICD-10-CM | POA: Insufficient documentation

## 2018-01-03 DIAGNOSIS — D510 Vitamin B12 deficiency anemia due to intrinsic factor deficiency: Secondary | ICD-10-CM | POA: Insufficient documentation

## 2018-01-03 DIAGNOSIS — Z95810 Presence of automatic (implantable) cardiac defibrillator: Secondary | ICD-10-CM

## 2018-01-03 DIAGNOSIS — F419 Anxiety disorder, unspecified: Secondary | ICD-10-CM | POA: Insufficient documentation

## 2018-01-03 DIAGNOSIS — I10 Essential (primary) hypertension: Secondary | ICD-10-CM

## 2018-01-03 DIAGNOSIS — Z959 Presence of cardiac and vascular implant and graft, unspecified: Secondary | ICD-10-CM

## 2018-01-03 DIAGNOSIS — Z006 Encounter for examination for normal comparison and control in clinical research program: Secondary | ICD-10-CM | POA: Insufficient documentation

## 2018-01-03 DIAGNOSIS — Z8719 Personal history of other diseases of the digestive system: Secondary | ICD-10-CM | POA: Diagnosis not present

## 2018-01-03 DIAGNOSIS — E785 Hyperlipidemia, unspecified: Secondary | ICD-10-CM | POA: Diagnosis not present

## 2018-01-03 DIAGNOSIS — I5022 Chronic systolic (congestive) heart failure: Secondary | ICD-10-CM | POA: Diagnosis not present

## 2018-01-03 DIAGNOSIS — I519 Heart disease, unspecified: Secondary | ICD-10-CM | POA: Diagnosis present

## 2018-01-03 DIAGNOSIS — Z885 Allergy status to narcotic agent status: Secondary | ICD-10-CM | POA: Diagnosis not present

## 2018-01-03 DIAGNOSIS — I428 Other cardiomyopathies: Secondary | ICD-10-CM | POA: Diagnosis not present

## 2018-01-03 HISTORY — PX: BIV ICD INSERTION CRT-D: EP1195

## 2018-01-03 LAB — SURGICAL PCR SCREEN
MRSA, PCR: NEGATIVE
STAPHYLOCOCCUS AUREUS: NEGATIVE

## 2018-01-03 SURGERY — BIV ICD INSERTION CRT-D

## 2018-01-03 MED ORDER — ONDANSETRON HCL 4 MG/2ML IJ SOLN: 4.0000 mg | Freq: Four times a day (QID) | INTRAMUSCULAR | Status: DC | PRN

## 2018-01-03 MED ORDER — CHLORHEXIDINE GLUCONATE 4 % EX LIQD
60.0000 mL | Freq: Once | CUTANEOUS | Status: DC
  Filled 2018-01-03: qty 60

## 2018-01-03 MED ORDER — SODIUM CHLORIDE 0.9 % IV SOLN
INTRAVENOUS | Status: DC
  Administered 2018-01-03: 10:00:00 via INTRAVENOUS

## 2018-01-03 MED ORDER — POTASSIUM CHLORIDE CRYS ER 20 MEQ PO TBCR
20.0000 meq | EXTENDED_RELEASE_TABLET | Freq: Every day | ORAL | Status: DC
  Administered 2018-01-04: 20 meq via ORAL
  Filled 2018-01-03: qty 1

## 2018-01-03 MED ORDER — MIDAZOLAM HCL 5 MG/5ML IJ SOLN
INTRAMUSCULAR | Status: AC
  Filled 2018-01-03: qty 5

## 2018-01-03 MED ORDER — SODIUM CHLORIDE 0.9% FLUSH
3.0000 mL | Freq: Two times a day (BID) | INTRAVENOUS | Status: DC
  Administered 2018-01-03 (×2): 3 mL via INTRAVENOUS

## 2018-01-03 MED ORDER — MUPIROCIN 2 % EX OINT
TOPICAL_OINTMENT | CUTANEOUS | Status: AC
  Administered 2018-01-03: 1 via TOPICAL
  Filled 2018-01-03: qty 22

## 2018-01-03 MED ORDER — IOPAMIDOL (ISOVUE-370) INJECTION 76%
INTRAVENOUS | Status: DC | PRN
  Administered 2018-01-03: 15 mL via INTRAVENOUS
  Administered 2018-01-03: 10 mL

## 2018-01-03 MED ORDER — IOPAMIDOL (ISOVUE-370) INJECTION 76%
INTRAVENOUS | Status: AC
  Filled 2018-01-03: qty 50

## 2018-01-03 MED ORDER — SODIUM CHLORIDE 0.9 % IV SOLN
INTRAVENOUS | Status: AC
  Filled 2018-01-03: qty 2

## 2018-01-03 MED ORDER — MIDAZOLAM HCL 5 MG/5ML IJ SOLN
INTRAMUSCULAR | Status: DC | PRN
  Administered 2018-01-03: 1 mg via INTRAVENOUS
  Administered 2018-01-03: 2 mg via INTRAVENOUS
  Administered 2018-01-03 (×5): 1 mg via INTRAVENOUS

## 2018-01-03 MED ORDER — FENTANYL CITRATE (PF) 100 MCG/2ML IJ SOLN
INTRAMUSCULAR | Status: DC | PRN
  Administered 2018-01-03 (×2): 12.5 ug via INTRAVENOUS
  Administered 2018-01-03: 25 ug via INTRAVENOUS
  Administered 2018-01-03: 12.5 ug via INTRAVENOUS

## 2018-01-03 MED ORDER — VENLAFAXINE HCL ER 37.5 MG PO CP24
37.5000 mg | ORAL_CAPSULE | Freq: Every day | ORAL | Status: DC
  Administered 2018-01-04: 37.5 mg via ORAL
  Filled 2018-01-03: qty 1

## 2018-01-03 MED ORDER — SODIUM CHLORIDE 0.9 % IV SOLN
80.0000 mg | INTRAVENOUS | Status: AC
  Administered 2018-01-03: 80 mg
  Filled 2018-01-03: qty 2

## 2018-01-03 MED ORDER — SODIUM CHLORIDE 0.9 % IV SOLN: 250.0000 mL | INTRAVENOUS | Status: DC | PRN

## 2018-01-03 MED ORDER — CEFAZOLIN SODIUM-DEXTROSE 2-4 GM/100ML-% IV SOLN
2.0000 g | INTRAVENOUS | Status: AC
  Administered 2018-01-03: 2 g via INTRAVENOUS
  Filled 2018-01-03: qty 100

## 2018-01-03 MED ORDER — CEFAZOLIN SODIUM-DEXTROSE 1-4 GM/50ML-% IV SOLN
1.0000 g | Freq: Four times a day (QID) | INTRAVENOUS | Status: AC
  Administered 2018-01-03 – 2018-01-04 (×3): 1 g via INTRAVENOUS
  Filled 2018-01-03 (×3): qty 50

## 2018-01-03 MED ORDER — TRIAMCINOLONE ACETONIDE 0.1 % EX CREA
1.0000 "application " | TOPICAL_CREAM | Freq: Two times a day (BID) | CUTANEOUS | Status: DC
  Administered 2018-01-03 (×2): 1 via TOPICAL
  Filled 2018-01-03: qty 15

## 2018-01-03 MED ORDER — LOSARTAN POTASSIUM 50 MG PO TABS
50.0000 mg | ORAL_TABLET | Freq: Every day | ORAL | Status: DC
  Administered 2018-01-04: 50 mg via ORAL
  Filled 2018-01-03: qty 1

## 2018-01-03 MED ORDER — MUPIROCIN 2 % EX OINT
1.0000 "application " | TOPICAL_OINTMENT | Freq: Once | CUTANEOUS | Status: AC
  Administered 2018-01-03: 1 via TOPICAL
  Filled 2018-01-03: qty 22

## 2018-01-03 MED ORDER — CARVEDILOL 3.125 MG PO TABS
3.1250 mg | ORAL_TABLET | Freq: Two times a day (BID) | ORAL | Status: DC
  Administered 2018-01-04: 3.125 mg via ORAL
  Filled 2018-01-03: qty 1

## 2018-01-03 MED ORDER — LIDOCAINE HCL (PF) 1 % IJ SOLN
INTRAMUSCULAR | Status: DC | PRN
  Administered 2018-01-03: 90 mL

## 2018-01-03 MED ORDER — FUROSEMIDE 40 MG PO TABS
40.0000 mg | ORAL_TABLET | Freq: Every day | ORAL | Status: DC
  Administered 2018-01-04: 40 mg via ORAL
  Filled 2018-01-03: qty 1

## 2018-01-03 MED ORDER — LETROZOLE 2.5 MG PO TABS
2.5000 mg | ORAL_TABLET | Freq: Every day | ORAL | Status: DC
  Administered 2018-01-03 – 2018-01-04 (×2): 2.5 mg via ORAL
  Filled 2018-01-03 (×2): qty 1

## 2018-01-03 MED ORDER — SODIUM CHLORIDE 0.9% FLUSH: 3.0000 mL | INTRAVENOUS | Status: DC | PRN

## 2018-01-03 MED ORDER — LIDOCAINE HCL (PF) 1 % IJ SOLN
INTRAMUSCULAR | Status: AC
  Filled 2018-01-03: qty 60

## 2018-01-03 MED ORDER — FENTANYL CITRATE (PF) 100 MCG/2ML IJ SOLN
INTRAMUSCULAR | Status: AC
  Filled 2018-01-03: qty 2

## 2018-01-03 MED ORDER — LIDOCAINE HCL (PF) 1 % IJ SOLN
INTRAMUSCULAR | Status: AC
  Filled 2018-01-03: qty 30

## 2018-01-03 MED ORDER — CEFAZOLIN SODIUM-DEXTROSE 2-4 GM/100ML-% IV SOLN
INTRAVENOUS | Status: AC
  Filled 2018-01-03: qty 100

## 2018-01-03 MED ORDER — ACETAMINOPHEN 325 MG PO TABS
325.0000 mg | ORAL_TABLET | ORAL | Status: DC | PRN
  Administered 2018-01-03 – 2018-01-04 (×3): 650 mg via ORAL
  Filled 2018-01-03 (×3): qty 2

## 2018-01-03 SURGICAL SUPPLY — 21 items
ADAPTER SEALING SSSA-09 (ADAPTER) ×2 IMPLANT
ASSURA CRTD CD3369-40Q (ICD Generator) ×3 IMPLANT
CABLE SURGICAL S-101-97-12 (CABLE) ×3 IMPLANT
CATH ATTAIN COM SURV 6250V-AM (CATHETERS) ×2 IMPLANT
CATH ATTAIN COM SURV 6250V-MB2 (CATHETERS) ×2 IMPLANT
CATH ATTAIN COM SURV 6250V-MP (CATHETERS) ×2 IMPLANT
CATH HEXAPOLAR DAMATO 6F (CATHETERS) ×2 IMPLANT
DEFIB ASSURA MULTI-CHMBR CRT-D (ICD Generator) IMPLANT
KIT ESSENTIALS PG (KITS) ×2 IMPLANT
LEAD DURATA 7122Q-58CM (Lead) ×2 IMPLANT
LEAD QUARTET 1456Q-86 (Lead) IMPLANT
LEAD TENDRIL MRI 52CM LPA1200M (Lead) ×2 IMPLANT
PAD PRO RADIOLUCENT 2001M-C (PAD) ×3 IMPLANT
QUARTET 1456Q-86 (Lead) ×3 IMPLANT
SHEATH CLASSIC 7F (SHEATH) ×2 IMPLANT
SHEATH CLASSIC 8F (SHEATH) ×2 IMPLANT
SHEATH CLASSIC 9.5F (SHEATH) ×2 IMPLANT
SLITTER 6232ADJ (MISCELLANEOUS) ×2 IMPLANT
TRAY PACEMAKER INSERTION (PACKS) ×3 IMPLANT
WIRE ACUITY WHISPER EDS 4648 (WIRE) ×4 IMPLANT
WIRE HI TORQ VERSACORE-J 145CM (WIRE) ×2 IMPLANT

## 2018-01-03 NOTE — Progress Notes (Signed)
2 IV required for procedure.  Patient right arm restricted.  2 20 gauge IV started in left arm

## 2018-01-03 NOTE — H&P (Signed)
PCP:  Rossie Muskrat, DO   Cardiologist:  Dr Bronson Ing Primary Electrophysiologist: Thompson Grayer, MD       CC: CHF, LBBB   History of Present Illness: Loretta Thomas is a 67 y.o. female who presents today for BiV ICD implantation.   She has a h/o nonischemic CM, LBBB, and chronic NYHA Class III CHF.    She has had CHF for several years.  She has been treated with beta blockers and ARB for "years".  Prior cath 07/18/2009 from [facility] revealed no significant CAD.  She has had several hospitalizations for CHF.  She has been treated with optimal medical therapy but continues to have reduced EF.  She reports fatigue and weakness.  She has chronic SOB with moderate activity.  + orthopnea. Since I saw her last, she has also been diagnosed with alpha gal.  Today, she denies symptoms of palpitations, chest pain,  lower extremity edema, claudication, dizziness, presyncope, syncope, bleeding, or neurologic sequela. The patient is tolerating medications without difficulties and is otherwise without complaint today.        Past Medical History:  Diagnosis Date  . Anxiety   . Bladder cystocele   . Breast cancer ([place])    s/p L breast cancer removal  . Bundle branch block, left    diagnosed 03/2012 followed by cath showing only minimal CAD  . Bursitis   . Cancer ([place])    right breast  . Capsulitis   . Diverticulosis   . Elevated liver enzymes 2007  . History of IBS   . Hyperlipidemia   . Hypertension   . Nonischemic cardiomyopathy ([place])   . pernicious anemia         Past Surgical History:  Procedure Laterality Date  . ANKLE FRACTURE SURGERY    . BREAST BIOPSY     right breast  . BREAST RECONSTRUCTION WITH PLACEMENT OF TISSUE EXPANDER AND FLEX HD (ACELLULAR HYDRATED DERMIS) Right 01/28/2014   Procedure: RIGHT BREAST RECONSTRUCTION WITH PLACEMENT OF TISSUE EXPANDER;  Surgeon: Crissie Reese, MD;  Location: [place];  Service: Plastics;  Laterality: Right;  .  CARDIAC CATHETERIZATION     x3; 03/18/12 [facility] [place]): 10-20% pLAD, <20% ostial LCX, 20% mPLA, EF 60%.    . CHOLECYSTECTOMY    . COLONOSCOPY     x4  . DILATION AND CURETTAGE OF UTERUS    . MASTECTOMY Right    malignant  . REDUCTION MAMMAPLASTY Left   . RHINOPLASTY    . SIMPLE MASTECTOMY WITH AXILLARY SENTINEL NODE BIOPSY Right 01/28/2014   Procedure: RIGHT TOTAL MASTECTOMY WITH  RIGHT AXILLARY SENTINEL NODE BIOPSY;  Surgeon: Excell Seltzer, MD;  Location: [place];  Service: General;  Laterality: Right;  . TONSILLECTOMY AND ADENOIDECTOMY    . TUBAL LIGATION             Current Outpatient Medications  Medication Sig Dispense Refill  . calcium carbonate (OS-CAL) 600 MG TABS tablet Take 600 mg by mouth 2 (two) times daily with a meal.    . carvedilol (COREG) 3.125 MG tablet Take 1 tablet (3.125 mg total) by mouth 2 (two) times daily with a meal. 60 tablet 6  . cyanocobalamin (,VITAMIN B-12,) 1000 MCG/ML injection Inject 1 mL (1,000 mcg total) into the muscle every 30 (thirty) days. 10 mL 0  . docusate sodium 100 MG CAPS Take 100 mg by mouth daily. 10 capsule 0  . furosemide (LASIX) 40 MG tablet Take 1 tablet (40 mg total) by mouth daily.  90 tablet 0  . letrozole (FEMARA) 2.5 MG tablet Take 1 tablet (2.5 mg total) by mouth daily. 90 tablet 3  . losartan (COZAAR) 50 MG tablet Take 1 tablet (50 mg total) by mouth daily. 90 tablet 0  . nystatin ointment (MYCOSTATIN) Apply 1 application topically as needed for itching.    . potassium chloride SA (K-DUR,KLOR-CON) 20 MEQ tablet Take 1 tablet (20 mEq total) by mouth daily. 90 tablet 0  . venlafaxine XR (EFFEXOR-XR) 37.5 MG 24 hr capsule Take 1 capsule (37.5 mg total) by mouth daily with breakfast. 30 capsule 5  . Vitamin D, Ergocalciferol, (DRISDOL) 50000 UNITS CAPS capsule Take 50,000 Units by mouth every 7 (seven) days.     No current facility-administered medications for this visit.      Allergies:   Codeine; Demerol [meperidine]; Hydrocodone; and Lortab [hydrocodone-acetaminophen]   Social History:  The patient  reports that she has never smoked. She has never used smokeless tobacco. She reports that she does not drink alcohol or use drugs.   Family History:  The patient's family history includes Breast cancer (age of onset: 27) in her cousin; Breast cancer (age of onset: 22) in her maternal aunt; Cancer in her brother and sister; Cancer (age of onset: 63) in her cousin; Cancer (age of onset: 61) in her maternal aunt; Cancer (age of onset: 63) in her maternal grandmother; Cancer (age of onset: 53) in her paternal grandfather; Cancer (age of onset: 18) in her father.    ROS:  Please see the history of present illness.   All other systems are personally reviewed and negative.    PHYSICAL EXAM: Vitals:   01/03/18 0933  BP: 116/89  Pulse: 85  Temp: 98.1 F (36.7 C)  SpO2: 98%   GEN: Well nourished, well developed, in no acute distress  HEENT: normal  Neck: no JVD, carotid bruits, or masses Cardiac: RRR; no murmurs, rubs, or gallops,no edema  Respiratory:  clear to auscultation bilaterally, normal work of breathing GI: soft, nontender, nondistended, + BS MS: no deformity or atrophy  Skin: warm and dry  Neuro:  Strength and sensation are intact Psych: euthymic mood, full affect  EKG:  EKG is ordered today. The ekg ordered today is personally reviewed and shows sinus rhythm 90 bpm, LBBB (QRS 154 msec)     Other studies personally reviewed: Additional studies/ records that were reviewed today include: Dr Court Joy notes  Review of the above records today demonstrates: as above  Echo 10/22/17 reveals EF 15%, mild LVH, global HK, moderate MR   ASSESSMENT AND PLAN:  1.  Nonischemic CM/ LBBB The patient has a nonischemic CM (EF 15%), NYHA Class III CHF, and LBBB.  She is referred by Dr Bronson Ing for risk stratification of sudden death and  consideration of ICD implantation.  At this time, she meets MADIT II/ SCD-HeFT criteria for ICD implantation for primary prevention of sudden death. Given QRS >150 msec, she may benefit from CRT.   I have had a thorough discussion with the patient reviewing options.  The patient and their family have had opportunities to ask questions and have them answered. The patient and I have decided together through a shared decision making process to BiV ICD at this time.   Risks, benefits, alternatives to BiV ICD implantation were discussed in detail with the patient today. The patient understands that the risks include but are not limited to bleeding, infection, pneumothorax, perforation, tamponade, vascular damage, renal failure, MI, stroke, death, inappropriate shocks,  and lead dislodgement and wishes to proceed.   ICD Criteria  Current LVEF:15%. Within 12 months prior to implant: Yes   Heart failure history: Yes, Class III  Cardiomyopathy history: Yes, Non-Ischemic Cardiomyopathy.  Atrial Fibrillation/Atrial Flutter: No.  Ventricular tachycardia history: No.  Cardiac arrest history: No.  History of syndromes with risk of sudden death: No.  Previous ICD: No.  Current ICD indication: Primary  PPM indication: Yes, BiV ICD, LBBB with QRS > 150 msec  Beta Blocker therapy for 3 or more months: Yes, prescribed.   Ace Inhibitor/ARB therapy for 3 or more months: Yes, prescribed.

## 2018-01-03 NOTE — Discharge Instructions (Signed)
° ° °  Supplemental Discharge Instructions for  Pacemaker/Defibrillator Patients  Activity No heavy lifting or vigorous activity with your left/right arm for 6 to 8 weeks.  Do not raise your left/right arm above your head for one week.  Gradually raise your affected arm as drawn below.             01/07/18                     01/08/18                    01/09/18                   01/10/18 __  NO DRIVING for  1 week   ; you may begin driving on 7/62/83 .  WOUND CARE - Keep the wound area clean and dry.  Do not get this area wet, no showers until cleared to at your wound check visit . - The tape/steri-strips on your wound will fall off; do not pull them off.  No bandage is needed on the site.  DO  NOT apply any creams, oils, or ointments to the wound area. - If you notice any drainage or discharge from the wound, any swelling or bruising at the site, or you develop a fever > 101? F after you are discharged home, call the office at once.  Special Instructions - You are still able to use cellular telephones; use the ear opposite the side where you have your pacemaker/defibrillator.  Avoid carrying your cellular phone near your device. - When traveling through airports, show security personnel your identification card to avoid being screened in the metal detectors.  Ask the security personnel to use the hand wand. - Avoid arc welding equipment, MRI testing (magnetic resonance imaging), TENS units (transcutaneous nerve stimulators).  Call the office for questions about other devices. - Avoid electrical appliances that are in poor condition or are not properly grounded. - Microwave ovens are safe to be near or to operate.  Additional information for defibrillator patients should your device go off: - If your device goes off ONCE and you feel fine afterward, notify the device clinic nurses. - If your device goes off ONCE and you do not feel well afterward, call 911. - If your device goes off TWICE,  call 911. - If your device goes off THREE times in one day, call 911.  DO NOT DRIVE YOURSELF OR A FAMILY MEMBER WITH A DEFIBRILLATOR TO THE HOSPITAL--CALL 911.

## 2018-01-04 ENCOUNTER — Ambulatory Visit (HOSPITAL_COMMUNITY): Payer: BLUE CROSS/BLUE SHIELD

## 2018-01-04 DIAGNOSIS — I509 Heart failure, unspecified: Secondary | ICD-10-CM | POA: Diagnosis not present

## 2018-01-04 DIAGNOSIS — I428 Other cardiomyopathies: Secondary | ICD-10-CM | POA: Diagnosis not present

## 2018-01-04 DIAGNOSIS — I447 Left bundle-branch block, unspecified: Secondary | ICD-10-CM | POA: Diagnosis not present

## 2018-01-04 DIAGNOSIS — Z006 Encounter for examination for normal comparison and control in clinical research program: Secondary | ICD-10-CM | POA: Diagnosis not present

## 2018-01-04 DIAGNOSIS — E785 Hyperlipidemia, unspecified: Secondary | ICD-10-CM

## 2018-01-04 DIAGNOSIS — Z95810 Presence of automatic (implantable) cardiac defibrillator: Secondary | ICD-10-CM

## 2018-01-04 DIAGNOSIS — I429 Cardiomyopathy, unspecified: Secondary | ICD-10-CM | POA: Diagnosis not present

## 2018-01-04 DIAGNOSIS — I10 Essential (primary) hypertension: Secondary | ICD-10-CM

## 2018-01-04 LAB — BASIC METABOLIC PANEL
Anion gap: 9 (ref 5–15)
BUN: 10 mg/dL (ref 8–23)
CO2: 25 mmol/L (ref 22–32)
Calcium: 8.7 mg/dL — ABNORMAL LOW (ref 8.9–10.3)
Chloride: 107 mmol/L (ref 98–111)
Creatinine, Ser: 0.69 mg/dL (ref 0.44–1.00)
GFR calc Af Amer: >60 mL/min (ref >60)
GLUCOSE: 97 mg/dL (ref 70–99)
Potassium: 4 mmol/L (ref 3.5–5.1)
Sodium: 141 mmol/L (ref 135–145)

## 2018-01-04 NOTE — Progress Notes (Signed)
Progress Note  Patient Name: Loretta Thomas Date of Encounter: 01/04/2018  Primary Cardiologist: Kate Sable, MD   Subjective   Feeling well today.  Status post [employer] CRT-D implanted yesterday.  Inpatient Medications    Scheduled Meds: . carvedilol  3.125 mg Oral BID WC  . furosemide  40 mg Oral Daily  . letrozole  2.5 mg Oral Daily  . losartan  50 mg Oral Daily  . potassium chloride SA  20 mEq Oral Daily  . sodium chloride flush  3 mL Intravenous Q12H  . triamcinolone cream  1 application Topical BID  . venlafaxine XR  37.5 mg Oral Q breakfast   Continuous Infusions: . sodium chloride     PRN Meds: sodium chloride, acetaminophen, ondansetron (ZOFRAN) IV, sodium chloride flush   Vital Signs    Vitals:   01/03/18 1600 01/03/18 1800 01/03/18 2045 01/04/18 0537  BP: 121/66 (!) 107/54 123/67 (!) 146/68  Pulse: 86 83    Resp: 14     Temp:   97.9 F (36.6 C) 97.7 F (36.5 C)  TempSrc:   Oral Oral  SpO2: 97%     Weight:    83.2 kg  Height:       No intake or output data in the 24 hours ending 01/04/18 0830 Filed Weights   01/03/18 0933 01/04/18 0537  Weight: 81.6 kg 83.2 kg    Telemetry    Sinus rhythm- Personally Reviewed  ECG    Sinus rhythm- Personally Reviewed  Physical Exam   GEN: Well nourished, well developed, in no acute distress  HEENT: normal  Neck: no JVD, carotid bruits, or masses Cardiac: RRR; no murmurs, rubs, or gallops,no edema  Respiratory:  clear to auscultation bilaterally, normal work of breathing GI: soft, nontender, nondistended, + BS MS: no deformity or atrophy  Skin: warm and dry, device site well healed Neuro:  Strength and sensation are intact Psych: euthymic mood, full affect   Labs    Chemistry Recent Labs  Lab 12/30/17 0957 01/04/18 0424  NA 143 141  K 4.1 4.0  CL 101 107  CO2 27 25  GLUCOSE 101* 97  BUN 12 10  CREATININE 0.81 0.69  CALCIUM 9.8 8.7*  GFRNONAA 76 >60  GFRAA 88 >60  ANIONGAP  --   9     Hematology Recent Labs  Lab 12/30/17 0957  WBC 6.5  RBC 4.01  HGB 12.3  HCT 35.9  MCV 90  MCH 30.7  MCHC 34.3  RDW 14.3  PLT 212    Cardiac EnzymesNo results for input(s): TROPONINI in the last 168 hours. No results for input(s): TROPIPOC in the last 168 hours.   BNPNo results for input(s): BNP, PROBNP in the last 168 hours.   DDimer No results for input(s): DDIMER in the last 168 hours.   Radiology    Dg Chest 2 View  Result Date: 01/04/2018 CLINICAL DATA:  Cardiac device in situ EXAM: CHEST - 2 VIEW COMPARISON:  CTA chest dated 09/28/2017 FINDINGS: Lungs are clear.  No pleural effusion or pneumothorax. The heart is top-normal in size. Left subclavian ICD in satisfactory position. Visualized osseous structures are within normal limits. IMPRESSION: Left subclavian ICD in satisfactory position. No pneumothorax. Electronically Signed   By: Julian Hy M.D.   On: 01/04/2018 08:12    Cardiac Studies   TTE 10/22/17 - Left ventricle: The cavity size was mildly to moderately dilated.   Wall thickness was increased in a pattern of mild LVH.  Systolic   function was severely reduced. The estimated ejection fraction   was in the range of 15% to 20%. Abnormal global longitudinal   strain of -10.5%. Diffuse hypokinesis. There is akinesis of the   basal-midinferior and inferoseptal myocardium. Indeterminate   diastolic function. - Aortic valve: Mildly calcified annulus. Trileaflet. - Mitral valve: Mildly calcified annulus. There was moderate   regurgitation. - Left atrium: The atrium was at the upper limits of normal in   size. - Right atrium: Central venous pressure (est): 3 mm Hg. - Atrial septum: No defect or patent foramen ovale was identified. - Tricuspid valve: There was mild regurgitation. - Pulmonary arteries: PA peak pressure: 39 mm Hg (S). - Pericardium, extracardiac: There was no pericardial effusion.  Patient Profile     67 y.o. female with nonischemic  cardiomyopathy admitted for ICD implant  Assessment & Plan    1.  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  Is now status post [employer] CRT-D.  Device functioning appropriately.  Chest x-ray and interrogation without issue.  Plan for discharge today.  For questions or updates, please contact [place] Please consult [email or web address] for contact info under        Signed, Lexia Vandevender Meredith Leeds, MD  01/04/2018, 8:30 AM

## 2018-01-04 NOTE — Discharge Summary (Addendum)
Discharge Summary    Patient ID: Loretta Thomas MRN: [id number]; DOB: 1950-10-12  Admit date: 01/03/2018 Discharge date: 01/04/2018  Primary Care Provider: Chana Bode, DO  Primary Cardiologist: Prentice Docker, MD  Primary Electrophysiologist:  None   Discharge Diagnoses    Principal Problem:   Chronic systolic dysfunction of left ventricle Active Problems:   Hypertension   Hyperlipidemia   ICD (implantable cardioverter-defibrillator), biventricular, in situ   Allergies Allergies  Allergen Reactions  . Codeine     Stomach Pains  . Demerol [Meperidine]     Upset stomach  . Hydrocodone   . Lortab [Hydrocodone-Acetaminophen]     Upset stomach    Diagnostic Studies/Procedures    BIV ICD insertion CRT-D 01/03/18  POSTPROCEDURE DIAGNOSES:   1. Nonischemic cardiomyopathy.   2. New York Heart Association class III heart failure chronically.   3. Left bundle-branch block.      PROCEDURES:    1. Left upper extremity venography  2. Biventricular ICD implantation.    TTE 10/22/17 - Left ventricle: The cavity size was mildly to moderately dilated. Wall thickness was increased in a pattern of mild LVH. Systolic function was severely reduced. The estimated ejection fraction was in the range of 15% to 20%. Abnormal global longitudinal strain of -10.5%. Diffuse hypokinesis. There is akinesis of the basal-midinferior and inferoseptal myocardium. Indeterminate diastolic function. - Aortic valve: Mildly calcified annulus. Trileaflet. - Mitral valve: Mildly calcified annulus. There was moderate regurgitation. - Left atrium: The atrium was at the upper limits of normal in size. - Right atrium: Central venous pressure (est): 3 mm Hg. - Atrial septum: No defect or patent foramen ovale was identified. - Tricuspid valve: There was mild regurgitation. - Pulmonary arteries: PA peak pressure: 39 mm Hg (S). - Pericardium, extracardiac: There was no pericardial  effusion.  History of Present Illness     Loretta Thomas a 67 y.o.femalewho presents today for BiV ICD implantation. She has a h/o nonischemic CM, LBBB, and chronic NYHA Class III CHF.  She has had CHF for several years.She has been treated with beta blockers and ARB for "years".Prior cath 07/18/2009 from [facility] revealed no significant CAD. She has had several hospitalizations for CHF. She has been treated with optimal medical therapy but continues to have reduced EF. She reports fatigue and weakness. She has chronic SOB with moderate activity. + orthopnea.  During her clinic visit on 01/03/18,shedenies symptoms of palpitations, chest pain, lower extremity edema, claudication, dizziness, presyncope, syncope, bleeding, or neurologic sequela. The patient is tolerating medications without difficulties and is otherwise without complaints.  Hospital Course     Consultants:   Given he persistently reduced EF in the setting of nonischemic cardiomyopathy and LBBB, she was scheduled for BIV ICD insertion on 01/03/18. Device was checked and is functioning properly. CXR and interrogation normal. She tolerated the procedure well.   She was seen and evaluated by Dr. Elberta Fortis and deemed stable for discharge. All follow up has been made.   _____________  Discharge Vitals Blood pressure (!) 146/68, pulse 83, temperature 97.7 F (36.5 C), temperature source Oral, resp. rate 14, height 5\' 7"  (1.702 m), weight 83.2 kg, SpO2 97 %.  Filed Weights   01/03/18 0933 01/04/18 0537  Weight: 81.6 kg 83.2 kg    Labs & Radiologic Studies    CBC No results for input(s): WBC, NEUTROABS, HGB, HCT, MCV, PLT in the last 72 hours. Basic Metabolic Panel Recent Labs    62/95/28 0424  NA 141  K 4.0  CL 107  CO2 25  GLUCOSE 97  BUN 10  CREATININE 0.69  CALCIUM 8.7*   Liver Function Tests No results for input(s): AST, ALT, ALKPHOS, BILITOT, PROT, ALBUMIN in the last 72 hours. No results for  input(s): LIPASE, AMYLASE in the last 72 hours. Cardiac Enzymes No results for input(s): CKTOTAL, CKMB, CKMBINDEX, TROPONINI in the last 72 hours. BNP Invalid input(s): POCBNP D-Dimer No results for input(s): DDIMER in the last 72 hours. Hemoglobin A1C No results for input(s): HGBA1C in the last 72 hours. Fasting Lipid Panel No results for input(s): CHOL, HDL, LDLCALC, TRIG, CHOLHDL, LDLDIRECT in the last 72 hours. Thyroid Function Tests No results for input(s): TSH, T4TOTAL, T3FREE, THYROIDAB in the last 72 hours.  Invalid input(s): FREET3 _____________  Dg Chest 2 View  Result Date: 01/04/2018 CLINICAL DATA:  Cardiac device in situ EXAM: CHEST - 2 VIEW COMPARISON:  CTA chest dated 09/28/2017 FINDINGS: Lungs are clear.  No pleural effusion or pneumothorax. The heart is top-normal in size. Left subclavian ICD in satisfactory position. Visualized osseous structures are within normal limits. IMPRESSION: Left subclavian ICD in satisfactory position. No pneumothorax. Electronically Signed   By: Charline Bills M.D.   On: 01/04/2018 08:12   Disposition   Pt is being discharged home today in good condition.  Follow-up Plans & Appointments    Follow-up Information    [facility] [address] [facility] Follow up on 01/13/2018.   Specialty:  Cardiology Why:  2:00PM, wound check visit Contact information: [address], [address] [place] [address] [phone]       Hillis Range, MD Follow up on 04/07/2018.   Specialty:  Cardiology Why:  2:15PM Contact information: [address] [place] [address] [phone]          Discharge Instructions    Diet - low sodium heart healthy   Complete by:  As directed    Increase activity slowly   Complete by:  As directed       Discharge Medications   Allergies as of 01/04/2018      Reactions   Codeine    Stomach Pains   Demerol [meperidine]    Upset stomach   Hydrocodone    Lortab  [hydrocodone-acetaminophen]    Upset stomach      Medication List    TAKE these medications   Calcium Carbonate-Vitamin D 600-400 MG-UNIT tablet Take 1 tablet by mouth 2 (two) times daily.   carvedilol 3.125 MG tablet Commonly known as:  COREG Take 1 tablet (3.125 mg total) by mouth 2 (two) times daily with a meal.   cyanocobalamin 1000 MCG/ML injection Commonly known as:  (VITAMIN B-12) Inject 1 mL (1,000 mcg total) into the muscle every 30 (thirty) days.   furosemide 40 MG tablet Commonly known as:  LASIX Take 1 tablet (40 mg total) by mouth daily.   letrozole 2.5 MG tablet Commonly known as:  FEMARA Take 1 tablet (2.5 mg total) by mouth daily.   losartan 50 MG tablet Commonly known as:  COZAAR Take 1 tablet (50 mg total) by mouth daily.   potassium chloride SA 20 MEQ tablet Commonly known as:  K-DUR,KLOR-CON Take 1 tablet (20 mEq total) by mouth daily.   triamcinolone cream 0.1 % Commonly known as:  KENALOG Apply 1 application topically 2 (two) times daily as needed for itching.   venlafaxine XR 37.5 MG 24 hr capsule Commonly known as:  EFFEXOR-XR Take 1 capsule (37.5 mg total) by  mouth daily with breakfast.   Vitamin D (Ergocalciferol) 50000 units Caps capsule Commonly known as:  DRISDOL Take 50,000 Units by mouth every 7 (seven) days. Fridays        Acute coronary syndrome (MI, NSTEMI, STEMI, etc) this admission?: No.    Outstanding Labs/Studies   Wound check as above  Duration of Discharge Encounter   Greater than 30 minutes including physician time.  Signed, Roe Rutherford Duke, PA 01/04/2018, 8:50 AM  I have seen and examined this patient with Micah Flesher.  Agree with above, note added to reflect my findings.  On exam, RRR, no murmurs, lungs clear.  Admitted to the hospital status post CRT-D [facility] insertion.  Device function and interrogation without issue.  Chest x-ray shows no evidence of pneumothorax.  Plan for discharge today with  follow-up in device clinic. Lekeith Wulf M. Armanie Ullmer MD 01/04/2018 12:08 PM

## 2018-01-13 ENCOUNTER — Telehealth: Payer: Self-pay | Admitting: Internal Medicine

## 2018-01-13 ENCOUNTER — Ambulatory Visit (INDEPENDENT_AMBULATORY_CARE_PROVIDER_SITE_OTHER): Payer: BLUE CROSS/BLUE SHIELD | Admitting: *Deleted

## 2018-01-13 DIAGNOSIS — I447 Left bundle-branch block, unspecified: Secondary | ICD-10-CM | POA: Diagnosis not present

## 2018-01-13 DIAGNOSIS — I5042 Chronic combined systolic (congestive) and diastolic (congestive) heart failure: Secondary | ICD-10-CM

## 2018-01-13 LAB — CUP PACEART INCLINIC DEVICE CHECK
Brady Statistic RA Percent Paced: 0.49 %
Date Time Interrogation Session: 20190930153117
HighPow Impedance: 66.375
Implantable Lead Implant Date: 20190920
Implantable Lead Implant Date: 20190920
Implantable Lead Location: 753858
Implantable Lead Location: 753860
Implantable Pulse Generator Implant Date: 20190920
Lead Channel Impedance Value: 475 Ohm
Lead Channel Impedance Value: 837.5 Ohm
Lead Channel Pacing Threshold Amplitude: 0.5 V
Lead Channel Pacing Threshold Amplitude: 0.5 V
Lead Channel Pacing Threshold Amplitude: 1.25 V
Lead Channel Pacing Threshold Pulse Width: 0.5 ms
Lead Channel Pacing Threshold Pulse Width: 0.5 ms
Lead Channel Pacing Threshold Pulse Width: 0.5 ms
Lead Channel Setting Pacing Amplitude: 2 V
Lead Channel Setting Pacing Amplitude: 2 V
Lead Channel Setting Pacing Pulse Width: 0.5 ms
MDC IDC LEAD IMPLANT DT: 20190920
MDC IDC LEAD LOCATION: 753859
MDC IDC MSMT BATTERY REMAINING LONGEVITY: 84 mo
MDC IDC MSMT LEADCHNL LV PACING THRESHOLD AMPLITUDE: 1.25 V
MDC IDC MSMT LEADCHNL RA IMPEDANCE VALUE: 437.5 Ohm
MDC IDC MSMT LEADCHNL RA PACING THRESHOLD AMPLITUDE: 0.5 V
MDC IDC MSMT LEADCHNL RA PACING THRESHOLD PULSEWIDTH: 0.5 ms
MDC IDC MSMT LEADCHNL RA SENSING INTR AMPL: 4 mV
MDC IDC MSMT LEADCHNL RV PACING THRESHOLD AMPLITUDE: 0.5 V
MDC IDC MSMT LEADCHNL RV PACING THRESHOLD PULSEWIDTH: 0.5 ms
MDC IDC MSMT LEADCHNL RV PACING THRESHOLD PULSEWIDTH: 0.5 ms
MDC IDC MSMT LEADCHNL RV SENSING INTR AMPL: 12 mV
MDC IDC SET LEADCHNL LV PACING PULSEWIDTH: 0.5 ms
MDC IDC SET LEADCHNL RA PACING AMPLITUDE: 3.5 V
MDC IDC SET LEADCHNL RV SENSING SENSITIVITY: 0.5 mV
MDC IDC STAT BRADY RV PERCENT PACED: 99.93 %
Pulse Gen Serial Number: 9833194

## 2018-01-13 NOTE — Progress Notes (Signed)
Wound CRT-D device check in office. Steri strips removed, medial incision site superficially unapproximated, site assessed by JA recommended coverage with bandaid for 3 days, Thresholds and sensing consistent with implant measurements. Lead impedance trends stable. No mode switch episodes recorded. No ventricular arrhythmia episodes recorded. Patient bi-ventricularly pacing 99% of the time. Device programmed with appropriate safety margins. Heart failure diagnostics reviewed and trends are stable for patient. vibratory alerts demonstrated for patient. No changes made this session. Estimated longevity 5.7-7 years. Patient educated about wound care, arm mobility, lifting restrictions, shock plan. ROV  01/20/18 for wound re-check.

## 2018-01-13 NOTE — Telephone Encounter (Signed)
Spoke to patient about her itching. Patient states that her "back, butt, and neck" are all itching and she believes it's yeast. Patient wanted to know if Dr.Allred would prescribe her something for the yeast. I told patient that she would need to contact her PCP regarding this matter. Patient states that she figured that, but "she doesn't want to have to pay another $150 for a doctor's visit". Again, I suggested that she contact her PCP. Patient verbalized understanding.

## 2018-01-13 NOTE — Telephone Encounter (Signed)
Patient called stating that she had a wound check today with Dr. Rayann Heman. She states that she is itching. She mentioned to Dr. Rayann Heman but nothing was every mentioned about calling in a prescription.

## 2018-01-20 ENCOUNTER — Ambulatory Visit (INDEPENDENT_AMBULATORY_CARE_PROVIDER_SITE_OTHER): Payer: BLUE CROSS/BLUE SHIELD | Admitting: *Deleted

## 2018-01-20 DIAGNOSIS — I519 Heart disease, unspecified: Secondary | ICD-10-CM

## 2018-01-20 NOTE — Progress Notes (Signed)
Wound re-check, medial incision edges superficially un-approximated steri-strips applied pt to return on 01/27/2018 for wound re-check.

## 2018-01-27 ENCOUNTER — Ambulatory Visit (INDEPENDENT_AMBULATORY_CARE_PROVIDER_SITE_OTHER): Payer: BLUE CROSS/BLUE SHIELD | Admitting: *Deleted

## 2018-01-27 DIAGNOSIS — Z95810 Presence of automatic (implantable) cardiac defibrillator: Secondary | ICD-10-CM

## 2018-01-27 NOTE — Progress Notes (Signed)
Loretta Thomas reports to the [facility] today for a wound re-check. Steri-strips removed, wound edges healing but still not completely approximated. Dr. Rayann Heman evaluated the site. He recommended typical post-ICD wound care at this time, leave open to air, keep hair back, shower and wash incision daily. Patient educated to call us if she notes redness, swelling, drainage, fever/chills. ROV with JA/[place] 02/14/18 per pt request.

## 2018-01-28 ENCOUNTER — Telehealth: Payer: Self-pay | Admitting: Cardiology

## 2018-01-28 ENCOUNTER — Ambulatory Visit (INDEPENDENT_AMBULATORY_CARE_PROVIDER_SITE_OTHER): Payer: BLUE CROSS/BLUE SHIELD | Admitting: *Deleted

## 2018-01-28 DIAGNOSIS — I519 Heart disease, unspecified: Secondary | ICD-10-CM

## 2018-01-28 NOTE — Progress Notes (Signed)
Patient presents to the office for a wound recheck d/t drainage from her device site. Patient states that she was heading into the shower when she noticed some clear drainage coming from an opening at her incision. Incision assessed, wound is healing well without redness or edema. Small opening noted mid incision, stitch removed. Spoke with Dr.Allred via telephone, recommendation was to apply bacitracin ointment and steri strips. Patient instructed to leave steri strips in place for 3 days. Patient to call if she notices any further drainage, or develops any signs/sx's of infection. Patient verbalized understanding of all information. Patient to follow up as scheduled.

## 2018-01-28 NOTE — Telephone Encounter (Signed)
Patient called and stated that she had drainage from her device site. Offered pt an appt today at 3 and she agreed.

## 2018-02-13 ENCOUNTER — Telehealth: Payer: Self-pay | Admitting: Internal Medicine

## 2018-02-13 NOTE — Telephone Encounter (Signed)
Patient called stating that she is having a cleaning with her dentist on Friday. She is wanting to check with Dr. Rayann Heman about is it too soon for her to get a cleaning?

## 2018-02-14 ENCOUNTER — Encounter: Payer: Self-pay | Admitting: Internal Medicine

## 2018-02-14 ENCOUNTER — Encounter: Payer: Self-pay | Admitting: *Deleted

## 2018-02-14 ENCOUNTER — Ambulatory Visit (INDEPENDENT_AMBULATORY_CARE_PROVIDER_SITE_OTHER): Payer: BLUE CROSS/BLUE SHIELD | Admitting: Internal Medicine

## 2018-02-14 VITALS — BP 124/74 | HR 76 | Ht 67.0 in | Wt 181.2 lb

## 2018-02-14 DIAGNOSIS — I519 Heart disease, unspecified: Secondary | ICD-10-CM

## 2018-02-14 NOTE — Patient Instructions (Signed)
Medication Instructions:  Continue all current medications.  Labwork: none  Testing/Procedures: none  Follow-Up: As previously scheduled.    Any Other Special Instructions Will Be Listed Below (If Applicable).  If you need a refill on your cardiac medications before your next appointment, please call your pharmacy.

## 2018-02-14 NOTE — Progress Notes (Signed)
Device pocket inspected and has completely healed. No CV concerns today No fevers, chills.  Return as previously scheduled in December.  Thompson Grayer MD, [employer] 02/14/2018 10:07 AM

## 2018-03-03 ENCOUNTER — Ambulatory Visit: Payer: BLUE CROSS/BLUE SHIELD | Admitting: Cardiovascular Disease

## 2018-03-04 ENCOUNTER — Other Ambulatory Visit: Payer: Self-pay | Admitting: *Deleted

## 2018-03-04 ENCOUNTER — Other Ambulatory Visit: Payer: Self-pay

## 2018-03-04 DIAGNOSIS — C50411 Malignant neoplasm of upper-outer quadrant of right female breast: Secondary | ICD-10-CM

## 2018-03-04 MED ORDER — LOSARTAN POTASSIUM 50 MG PO TABS: 50.0000 mg | ORAL_TABLET | Freq: Every day | ORAL | 2 refills | Status: DC

## 2018-03-04 MED ORDER — POTASSIUM CHLORIDE CRYS ER 20 MEQ PO TBCR: 20.0000 meq | EXTENDED_RELEASE_TABLET | Freq: Every day | ORAL | 2 refills | Status: DC

## 2018-03-04 MED ORDER — FUROSEMIDE 40 MG PO TABS: 40.0000 mg | ORAL_TABLET | Freq: Every day | ORAL | 2 refills | Status: DC

## 2018-03-04 MED ORDER — VENLAFAXINE HCL ER 37.5 MG PO CP24: 37.5000 mg | ORAL_CAPSULE | Freq: Every day | ORAL | 5 refills | Status: DC

## 2018-03-04 NOTE — Addendum Note (Signed)
Addended by: Merlene Laughter on: 03/04/2018 07:31 AM   Modules accepted: Orders

## 2018-03-05 ENCOUNTER — Other Ambulatory Visit: Payer: Self-pay

## 2018-03-05 DIAGNOSIS — C50411 Malignant neoplasm of upper-outer quadrant of right female breast: Secondary | ICD-10-CM

## 2018-03-05 NOTE — Telephone Encounter (Signed)
Refill request sent for Venlafaxine ER 37.5 MG tablet - take one capsule PO every day - Called in refills to pharmacy.

## 2018-04-04 ENCOUNTER — Other Ambulatory Visit: Payer: Self-pay | Admitting: Hematology and Oncology

## 2018-04-04 ENCOUNTER — Telehealth: Payer: Self-pay

## 2018-04-04 DIAGNOSIS — Z1231 Encounter for screening mammogram for malignant neoplasm of breast: Secondary | ICD-10-CM

## 2018-04-04 NOTE — Telephone Encounter (Signed)
VM left requesting mammogram to be scheduled at the breast center.   Nurse returned call.  VM left informing patient orders were placed, Breast Center contact information left along with office contact.

## 2018-04-07 ENCOUNTER — Other Ambulatory Visit: Payer: Self-pay | Admitting: *Deleted

## 2018-04-07 ENCOUNTER — Encounter: Payer: Self-pay | Admitting: Internal Medicine

## 2018-04-07 ENCOUNTER — Ambulatory Visit (INDEPENDENT_AMBULATORY_CARE_PROVIDER_SITE_OTHER): Payer: BLUE CROSS/BLUE SHIELD | Admitting: Internal Medicine

## 2018-04-07 VITALS — BP 124/86 | HR 68 | Ht 67.0 in | Wt 187.0 lb

## 2018-04-07 DIAGNOSIS — Z95810 Presence of automatic (implantable) cardiac defibrillator: Secondary | ICD-10-CM | POA: Diagnosis not present

## 2018-04-07 DIAGNOSIS — I519 Heart disease, unspecified: Secondary | ICD-10-CM

## 2018-04-07 DIAGNOSIS — I447 Left bundle-branch block, unspecified: Secondary | ICD-10-CM

## 2018-04-07 DIAGNOSIS — I10 Essential (primary) hypertension: Secondary | ICD-10-CM

## 2018-04-07 NOTE — Progress Notes (Signed)
PCP: Rossie Muskrat, DO Primary Cardiologist: Dr Bronson Ing Primary EP: Dr Rayann Heman  Loretta Thomas is a 67 y.o. female who presents today for routine electrophysiology followup.  Since her ICD implant, the patient reports doing very well.  Her device pocket has healed.  She reports improvement in SOB and fatigue.  Today, she denies symptoms of palpitations, chest pain, shortness of breath,  lower extremity edema, dizziness, presyncope, syncope, or ICD shocks.  The patient is otherwise without complaint today.   Past Medical History:  Diagnosis Date  . Anxiety   . Bladder cystocele   . Breast cancer ([place])    s/p L breast cancer removal  . Bundle branch block, left    diagnosed 03/2012 followed by cath showing only minimal CAD  . Bursitis   . Cancer ([place])    right breast  . Capsulitis   . Diverticulosis   . Elevated liver enzymes 2007  . History of IBS   . Hyperlipidemia   . Hypertension   . Nonischemic cardiomyopathy ([place])   . pernicious anemia    Past Surgical History:  Procedure Laterality Date  . ANKLE FRACTURE SURGERY    . BIV ICD INSERTION CRT-D N/A 01/03/2018    [facility] [employer] MP model [phone] (serial  Number [id number]) biventricular ICD for primary prevention of sudden death  . BREAST BIOPSY     right breast  . BREAST RECONSTRUCTION WITH PLACEMENT OF TISSUE EXPANDER AND FLEX HD (ACELLULAR HYDRATED DERMIS) Right 01/28/2014   Procedure: RIGHT BREAST RECONSTRUCTION WITH PLACEMENT OF TISSUE EXPANDER;  Surgeon: Crissie Reese, MD;  Location: [facility];  Service: Plastics;  Laterality: Right;  . CARDIAC CATHETERIZATION     x3; 03/18/12 [facility]): 10-20% pLAD, <20% ostial LCX, 20% mPLA, EF 60%.    . CHOLECYSTECTOMY    . COLONOSCOPY     x4  . DILATION AND CURETTAGE OF UTERUS    . MASTECTOMY Right    malignant  . REDUCTION MAMMAPLASTY Left   . RHINOPLASTY    . SIMPLE MASTECTOMY WITH AXILLARY SENTINEL NODE BIOPSY Right 01/28/2014   Procedure: RIGHT TOTAL MASTECTOMY WITH  RIGHT AXILLARY SENTINEL NODE BIOPSY;  Surgeon: Excell Seltzer, MD;  Location: [place];  Service: General;  Laterality: Right;  . TONSILLECTOMY AND ADENOIDECTOMY    . TUBAL LIGATION      ROS- all systems are reviewed and negative except as per HPI above  Current Outpatient Medications  Medication Sig Dispense Refill  . Calcium Carbonate-Vitamin D 600-400 MG-UNIT tablet Take 1 tablet by mouth 2 (two) times daily.    . carvedilol (COREG) 3.125 MG tablet Take 1 tablet (3.125 mg total) by mouth 2 (two) times daily with a meal. 60 tablet 6  . cefUROXime (CEFTIN) 500 MG tablet Take 500 mg by mouth 2 (two) times daily with a meal.    . cyanocobalamin (,VITAMIN B-12,) 1000 MCG/ML injection Inject 1 mL (1,000 mcg total) into the muscle every 30 (thirty) days. 10 mL 0  . doxycycline (VIBRAMYCIN) 100 MG capsule Take 100 mg by mouth daily.    . famotidine (PEPCID) 40 MG tablet Take 40 mg by mouth daily.  6  . fexofenadine (ALLEGRA) 180 MG tablet Take 180 mg by mouth daily.  6  . furosemide (LASIX) 40 MG tablet Take 1 tablet (40 mg total) by mouth daily. 90 tablet 2  . letrozole (FEMARA) 2.5 MG tablet Take 1 tablet (2.5 mg total) by mouth daily. 90 tablet 3  . losartan (  COZAAR) 50 MG tablet Take 1 tablet (50 mg total) by mouth daily. 90 tablet 2  . potassium chloride SA (K-DUR,KLOR-CON) 20 MEQ tablet Take 1 tablet (20 mEq total) by mouth daily. 90 tablet 2  . triamcinolone cream (KENALOG) 0.1 % Apply 1 application topically 2 (two) times daily as needed for itching.    . venlafaxine XR (EFFEXOR-XR) 37.5 MG 24 hr capsule Take 1 capsule (37.5 mg total) by mouth daily with breakfast. 30 capsule 5  . Vitamin D, Ergocalciferol, (DRISDOL) 50000 UNITS CAPS capsule Take 50,000 Units by mouth every 7 (seven) days. Fridays     No current facility-administered medications for this visit.     Physical Exam: Vitals:   04/07/18 1503  BP: 124/86  Pulse: 68  SpO2: 97%    Weight: 187 lb (84.8 kg)  Height: 5\' 7"  (1.702 m)    GEN- The patient is well appearing, alert and oriented x 3 today.   Head- normocephalic, atraumatic Eyes-  Sclera clear, conjunctiva pink Ears- hearing intact Oropharynx- clear Lungs- Clear to ausculation bilaterally, normal work of breathing Chest- ICD pocket is well healed Heart- Regular rate and rhythm, no murmurs, rubs or gallops, PMI not laterally displaced GI- soft, NT, ND, + BS Extremities- no clubbing, cyanosis, or edema  ICD interrogation- reviewed in detail today,  See PACEART report  ekg tracing ordered today is personally reviewed and shows sinus rhythm with BiV pacing  Wt Readings from Last 3 Encounters:  04/07/18 187 lb (84.8 kg)  02/14/18 181 lb 3.2 oz (82.2 kg)  01/04/18 183 lb 6.4 oz (83.2 kg)    Assessment and Plan:  1.  Chronic systolic dysfunction/ nonischemic CM/ LBBB euvolemic today Stable on an appropriate medical regimen Normal ICD function See Pace Art report No changes today Enroll in followed in [facility] device clinic   Follow-up with Dr Bronson Ing in February as scheduled We should repeat echo at that time to assess response to CRT  Follow-up with EP NP in 6 months I will see in [place] in a year  Thompson Grayer MD, [facility] 04/07/2018 3:19 PM

## 2018-04-07 NOTE — Patient Instructions (Addendum)
Medication Instructions:  Your physician recommends that you continue on your current medications as directed. Please refer to the Current Medication list given to you today.  Labwork: None ordered.  Testing/Procedures: None ordered.  Follow-Up:  Your physician wants you to follow-up in: 6 months with Chanetta Marshall, NP.   You will receive a reminder letter in the mail two months in advance. If you don't receive a letter, please call our office to schedule the follow-up appointment.  Your physician wants you to follow-up in: one year with Dr. Rayann Heman in [place].   You will receive a reminder letter in the mail two months in advance. If you don't receive a letter, please call our office to schedule the follow-up appointment.  Remote monitoring is used to monitor your ICD from home. This monitoring reduces the number of office visits required to check your device to one time per year. It allows [place] to keep an eye on the functioning of your device to ensure it is working properly. You are scheduled for a device check from home on 07/07/2018. You may send your transmission at any time that day. If you have a wireless device, the transmission will be sent automatically. After your physician reviews your transmission, you will receive a postcard with your next transmission date.  Any Other Special Instructions Will Be Listed Below (If Applicable).  You are being referred to "[facility]" with Sharman Cheek. She will give you a call to get you enrolled.  If you need a refill on your cardiac medications before your next appointment, please call your pharmacy.

## 2018-04-15 ENCOUNTER — Telehealth: Payer: Self-pay

## 2018-04-15 NOTE — Telephone Encounter (Signed)
Referred to ICM clinic by Dr Rayann Heman.  Spoke with patient and agreeable to monthly follow up.  Advised monitor should be by bedside in order for it to automatically transmit a report during sleep hours of 12 midnight and 6 AM.  Patient confirmed monitor is at bedside. Advised will receive a call after the transmission is reviewed to provide results.  Explained a Remote Home Transmission will be seen as daytime appointment on office summary visit sheet but the time is not relevant since all transmissions are sent at night time so there is no obligation to stay by the monitor at that time appointed time during the day. Provided ICM number and explained should call if experiencing any fluid symptoms such as weight gain, shortness of breath or extremity/abdominal swelling. She said she did have some weight gain in the last week due to eating chicken noodle soup because she has bronchitis. She is aware soups are high in sodium but needed something that was light while she was sick.  Advised 1st ICM remote transmission scheduled for 05/12/2018.

## 2018-04-17 ENCOUNTER — Encounter: Payer: Self-pay | Admitting: *Deleted

## 2018-04-17 ENCOUNTER — Other Ambulatory Visit: Payer: Self-pay | Admitting: *Deleted

## 2018-04-17 DIAGNOSIS — I428 Other cardiomyopathies: Secondary | ICD-10-CM

## 2018-04-18 ENCOUNTER — Telehealth: Payer: Self-pay | Admitting: Cardiovascular Disease

## 2018-04-18 NOTE — Telephone Encounter (Signed)
Patient called wanting to know if it is OK for her to take the flu shot.  States that she has been sick for the past 2 weeks with upper Respiratory infection.  [phone].

## 2018-05-02 ENCOUNTER — Telehealth: Payer: Self-pay | Admitting: Hematology and Oncology

## 2018-05-02 NOTE — Telephone Encounter (Signed)
Called pt due to appt being moved - was speaking with the patient because she wanted to move the appt to 1/24 after her mammo and I told her that [facility]G was on call and we wouldn't be able to add that appointment and she hung up.

## 2018-05-08 ENCOUNTER — Ambulatory Visit: Payer: BLUE CROSS/BLUE SHIELD | Admitting: Hematology and Oncology

## 2018-05-09 ENCOUNTER — Ambulatory Visit
Admission: RE | Admit: 2018-05-09 | Discharge: 2018-05-09 | Disposition: A | Discharge status: Home / Self Care | Payer: BLUE CROSS/BLUE SHIELD | Source: Ambulatory Visit | Attending: Hematology and Oncology | Admitting: Hematology and Oncology

## 2018-05-09 ENCOUNTER — Other Ambulatory Visit: Payer: Self-pay | Admitting: Hematology and Oncology

## 2018-05-09 DIAGNOSIS — N6320 Unspecified lump in the left breast, unspecified quadrant: Secondary | ICD-10-CM

## 2018-05-09 DIAGNOSIS — Z1231 Encounter for screening mammogram for malignant neoplasm of breast: Secondary | ICD-10-CM

## 2018-05-12 ENCOUNTER — Ambulatory Visit (INDEPENDENT_AMBULATORY_CARE_PROVIDER_SITE_OTHER): Payer: BLUE CROSS/BLUE SHIELD

## 2018-05-12 ENCOUNTER — Inpatient Hospital Stay: Payer: BLUE CROSS/BLUE SHIELD | Attending: Hematology and Oncology | Admitting: Hematology and Oncology

## 2018-05-12 ENCOUNTER — Telehealth: Payer: Self-pay | Admitting: Hematology and Oncology

## 2018-05-12 VITALS — BP 111/78 | HR 99 | Temp 98.4°F | Resp 17 | Ht 67.0 in | Wt 176.0 lb

## 2018-05-12 DIAGNOSIS — C50411 Malignant neoplasm of upper-outer quadrant of right female breast: Secondary | ICD-10-CM | POA: Diagnosis not present

## 2018-05-12 DIAGNOSIS — Z95810 Presence of automatic (implantable) cardiac defibrillator: Secondary | ICD-10-CM

## 2018-05-12 DIAGNOSIS — Z170 Estrogen receptor positive status [ER+]: Secondary | ICD-10-CM | POA: Diagnosis not present

## 2018-05-12 DIAGNOSIS — I5042 Chronic combined systolic (congestive) and diastolic (congestive) heart failure: Secondary | ICD-10-CM | POA: Diagnosis not present

## 2018-05-12 DIAGNOSIS — D510 Vitamin B12 deficiency anemia due to intrinsic factor deficiency: Secondary | ICD-10-CM | POA: Diagnosis not present

## 2018-05-12 DIAGNOSIS — I509 Heart failure, unspecified: Secondary | ICD-10-CM

## 2018-05-12 DIAGNOSIS — F329 Major depressive disorder, single episode, unspecified: Secondary | ICD-10-CM | POA: Insufficient documentation

## 2018-05-12 DIAGNOSIS — Z9011 Acquired absence of right breast and nipple: Secondary | ICD-10-CM

## 2018-05-12 DIAGNOSIS — Z950 Presence of cardiac pacemaker: Secondary | ICD-10-CM | POA: Diagnosis not present

## 2018-05-12 NOTE — Assessment & Plan Note (Signed)
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 She may consider taking Effexor every other day. Breast Cancer Surveillance: 1. Mammogram  January 2020 benign. Breast Density Category B. 2.  Breast exam: 05/12/2018: Benign 3.  Breast MRI annually  Congestive heart failure: Currently on therapy  Prior history of pernicious anemia on monthly B12 injections: She has not been taking the B12 injections lately.  Instructed her to restart them.  Return to clinic in 1 year for follow-up

## 2018-05-12 NOTE — Progress Notes (Signed)
Patient Care Team: Rossie Muskrat, DO as PCP - General (Family Medicine) Herminio Commons, MD as PCP - Cardiology (Cardiology) Thompson Grayer, MD as PCP - Electrophysiology (Cardiology) Rossie Muskrat, DO as Referring Physician (Family Medicine)  DIAGNOSIS:  Encounter Diagnosis  Name Primary?  . Malignant neoplasm of upper-outer quadrant of right breast in female, estrogen receptor positive ([name]) Yes    SUMMARY OF ONCOLOGIC HISTORY:   Breast cancer of upper-outer quadrant of right female breast ([name])   10/19/2013 Mammogram    outer right breast, middle third, demonstrate a 6 x 6 x 12 mm group of heterogeneous calcifications    11/10/2013 Breast MRI    nonmacerated enhancement in the retroareolar region extending to the nipple with the area of enhancement measuring 4.3 x 2.5 x 3.0 cm    11/12/2013 Initial Diagnosis    Right mastectomy: Invasive ductal carcinoma with DCIS with comedo-type necrosis and calcifications in ER 99% PR 30% Ki-67 40% HER-2 negative ratio 1.14; Grade 3; second biopsy from subareolar area done 11/16/2013 revealed DCIS with calcifications    12/29/2013 Procedure    BRCA 1 and 2 Negative: 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 (32-58% lifetime risk of breast cancer and increased lifetime risk of pancreatic cancer):RAD51D variant of unknown significance: [id number]    03/09/2014 Procedure    Oncotype DX recurrence score 24, 16% risk of recurrence intermediate risk, patient offered chemotherapy    03/22/2014 -  Anti-estrogen oral therapy    Arimidex 1 mg daily switched to letrozole 07/26/2016 due to muscle aches and pains     CHIEF COMPLIANT: Follow-up and surveillance of breast cancer, recent problems with congestive heart failure  INTERVAL HISTORY: Loretta Thomas is a 3-year with above-mentioned history of right breast cancer treated with mastectomy.  She had PALB 2 mutation.  She is currently taking antiestrogen therapy with letrozole and appears to  be tolerating it fairly well.  Over the past year she had extensive problems related to congestive heart failure and she had required a pacemaker implantation.  She has lost significant amount of weight probably because of diuretics.  REVIEW OF SYSTEMS:   Constitutional: Denies fevers, chills or abnormal weight loss Eyes: Denies blurriness of vision Ears, nose, mouth, throat, and face: Denies mucositis or sore throat Respiratory: Denies cough, dyspnea or wheezes Cardiovascular: Denies palpitation, chest discomfort Gastrointestinal:  Denies nausea, heartburn or change in bowel habits Skin: Denies abnormal skin rashes Lymphatics: Denies new lymphadenopathy or easy bruising Neurological:Denies numbness, tingling or new weaknesses Behavioral/Psych: Mood is stable, no new changes  Extremities: No lower extremity edema Breast:  denies any pain or lumps or nodules in either breasts All other systems were reviewed with the patient and are negative.  I have reviewed the past medical history, past surgical history, social history and family history with the patient and they are unchanged from previous note.  ALLERGIES:  is allergic to codeine; demerol [meperidine]; hydrocodone; and lortab [hydrocodone-acetaminophen].  MEDICATIONS:  Current Outpatient Medications  Medication Sig Dispense Refill  . Calcium Carbonate-Vitamin D 600-400 MG-UNIT tablet Take 1 tablet by mouth 2 (two) times daily.    . carvedilol (COREG) 3.125 MG tablet Take 1 tablet (3.125 mg total) by mouth 2 (two) times daily with a meal. 60 tablet 6  . cefUROXime (CEFTIN) 500 MG tablet Take 500 mg by mouth 2 (two) times daily with a meal.    . cyanocobalamin (,VITAMIN B-12,) 1000 MCG/ML injection Inject 1 mL (1,000 mcg total) into the muscle every 30 (thirty) days.  10 mL 0  . doxycycline (VIBRAMYCIN) 100 MG capsule Take 100 mg by mouth daily.    . famotidine (PEPCID) 40 MG tablet Take 40 mg by mouth daily.  6  . fexofenadine  (ALLEGRA) 180 MG tablet Take 180 mg by mouth daily.  6  . furosemide (LASIX) 40 MG tablet Take 1 tablet (40 mg total) by mouth daily. 90 tablet 2  . letrozole (FEMARA) 2.5 MG tablet Take 1 tablet (2.5 mg total) by mouth daily. 90 tablet 3  . losartan (COZAAR) 50 MG tablet Take 1 tablet (50 mg total) by mouth daily. 90 tablet 2  . potassium chloride SA (K-DUR,KLOR-CON) 20 MEQ tablet Take 1 tablet (20 mEq total) by mouth daily. 90 tablet 2  . triamcinolone cream (KENALOG) 0.1 % Apply 1 application topically 2 (two) times daily as needed for itching.    . venlafaxine XR (EFFEXOR-XR) 37.5 MG 24 hr capsule Take 1 capsule (37.5 mg total) by mouth daily with breakfast. 30 capsule 5  . Vitamin D, Ergocalciferol, (DRISDOL) 50000 UNITS CAPS capsule Take 50,000 Units by mouth every 7 (seven) days. Fridays     No current facility-administered medications for this visit.     PHYSICAL EXAMINATION: ECOG PERFORMANCE STATUS: 1 - Symptomatic but completely ambulatory  Vitals:   05/12/18 1511  BP: 111/78  Pulse: 99  Resp: 17  Temp: 98.4 F (36.9 C)  SpO2: 98%   Filed Weights   05/12/18 1511  Weight: 176 lb (79.8 kg)    GENERAL:alert, no distress and comfortable SKIN: skin color, texture, turgor are normal, no rashes or significant lesions EYES: normal, Conjunctiva are pink and non-injected, sclera clear OROPHARYNX:no exudate, no erythema and lips, buccal mucosa, and tongue normal  NECK: supple, thyroid normal size, non-tender, without nodularity LYMPH:  no palpable lymphadenopathy in the cervical, axillary or inguinal LUNGS: clear to auscultation and percussion with normal breathing effort HEART: regular rate & rhythm and no murmurs and no lower extremity edema ABDOMEN:abdomen soft, non-tender and normal bowel sounds MUSCULOSKELETAL:no cyanosis of digits and no clubbing  NEURO: alert & oriented x 3 with fluent speech, no focal motor/sensory deficits EXTREMITIES: No lower extremity edema BREAST:  No palpable masses or nodules in either right or left breasts. No palpable axillary supraclavicular or infraclavicular adenopathy no breast tenderness or nipple discharge. (exam performed in the presence of a chaperone)  LABORATORY DATA:  I have reviewed the data as listed CMP Latest Ref Rng & Units 01/04/2018 12/30/2017 10/22/2017  Glucose 70 - 99 mg/dL 97 101(H) -  BUN 8 - 23 mg/dL 10 12 -  Creatinine 0.44 - 1.00 mg/dL 0.69 0.81 0.80  Sodium 135 - 145 mmol/L 141 143 -  Potassium 3.5 - 5.1 mmol/L 4.0 4.1 -  Chloride 98 - 111 mmol/L 107 101 -  CO2 22 - 32 mmol/L 25 27 -  Calcium 8.9 - 10.3 mg/dL 8.7(L) 9.8 -  Total Protein 6.4 - 8.3 g/dL - - -  Total Bilirubin 0.20 - 1.20 mg/dL - - -  Alkaline Phos 40 - 150 U/L - - -  AST 5 - 34 U/L - - -  ALT 0 - 55 U/L - - -    Lab Results  Component Value Date   WBC 6.5 12/30/2017   HGB 12.3 12/30/2017   HCT 35.9 12/30/2017   MCV 90 12/30/2017   PLT 212 12/30/2017   NEUTROABS 4.2 12/30/2017    ASSESSMENT & PLAN:  No problem-specific Assessment & Plan notes found for  this encounter.    Orders Placed This Encounter  Procedures  . Ambulatory referral to Physical Therapy    Referral Priority:   Routine    Referral Type:   Physical Medicine    Referral Reason:   Specialty Services Required    Requested Specialty:   Physical Therapy    Number of Visits Requested:   1   The patient has a good understanding of the overall plan. she agrees with it. she will call with any problems that may develop before the next visit here.   Harriette Ohara, MD 05/12/18

## 2018-05-12 NOTE — Telephone Encounter (Signed)
Gave avs and calendar ° °

## 2018-05-12 NOTE — Progress Notes (Addendum)
EPIC Encounter for ICM Monitoring  Patient Name: Loretta Thomas is a 68 y.o. female Date: 05/12/2018 Primary Care Physican: Rossie Muskrat, DO Primary Cardiologist: Bronson Ing Electrophysiologist: Allred Bi-V Pacing:   >99%  Today's Weight: 176 lbs        Heart Failure questions reviewed.  She feels sluggish and lack of energy.  She is unsure what caused decreased impedance in [place].  She has eaten a lot of soup.  Discussed limiting salt intake.  Report: Thoracic impedance normal but was abnormal suggesting fluid accumulation from 05/02/2018 - 05/05/2018.   Prescribed: Furosemide 40 mg take 1 tablet daily. Potassium 20 mEq 1 tablet daily  Labs: 01/04/2018 Creatinine 0.69, BUN 10, Potassium 4.0, Sodium 141, eGFR >60  12/30/2017 Creatinine 0.81, BUN 12, Potassium 4.1, Sodium 143, eGFR 76-88  A complete set of results can be found in Results Review.  Recommendations: No changes.   Reinforced limiting salt intake to < 2000 mg daily and fluid intake to 64 oz daily.   Encouraged to call for fluid symptoms.  Follow-up plan: ICM clinic phone appointment on 05/26/2018 to recheck fluid levels.   Office appointment scheduled 05/26/2018 with Dr. Bronson Ing.    Copy of ICM check sent to Dr. Rayann Heman.   3 month ICM trend: 05/12/2018    1 Year ICM trend:       [name], RN 05/12/2018 3:14 PM

## 2018-05-15 ENCOUNTER — Other Ambulatory Visit: Payer: Self-pay

## 2018-05-15 ENCOUNTER — Encounter: Payer: Self-pay | Admitting: Physical Therapy

## 2018-05-15 ENCOUNTER — Ambulatory Visit: Payer: BLUE CROSS/BLUE SHIELD | Attending: Hematology and Oncology | Admitting: Physical Therapy

## 2018-05-15 DIAGNOSIS — R293 Abnormal posture: Secondary | ICD-10-CM | POA: Diagnosis present

## 2018-05-15 DIAGNOSIS — M25611 Stiffness of right shoulder, not elsewhere classified: Secondary | ICD-10-CM | POA: Insufficient documentation

## 2018-05-15 DIAGNOSIS — M25612 Stiffness of left shoulder, not elsewhere classified: Secondary | ICD-10-CM | POA: Diagnosis present

## 2018-05-15 DIAGNOSIS — M6281 Muscle weakness (generalized): Secondary | ICD-10-CM | POA: Diagnosis present

## 2018-05-15 DIAGNOSIS — M79644 Pain in right finger(s): Secondary | ICD-10-CM | POA: Insufficient documentation

## 2018-05-15 NOTE — Therapy (Signed)
[place], [place], [address] Phone: [phone]   Fax:  [phone]  Physical Therapy Evaluation  Patient Details  Name: Loretta Thomas MRN: [id number] Date of Birth: 1950/06/28 Referring Provider (PT): Lindi Adie   Encounter Date: 05/15/2018  PT End of Session - 05/15/18 3419    Visit Number  1    Number of Visits  9    Date for PT Re-Evaluation  06/12/18    Authorization Type  no auth    PT Start Time  1422    PT Stop Time  1510    PT Time Calculation (min)  48 min    Activity Tolerance  Patient tolerated treatment well    Behavior During Therapy  [facility] for tasks assessed/performed       Past Medical History:  Diagnosis Date  . Anxiety   . Bladder cystocele   . Breast cancer ([place])    s/p L breast cancer removal  . Bundle branch block, left    diagnosed 03/2012 followed by cath showing only minimal CAD  . Bursitis   . Cancer ([place])    right breast  . Capsulitis   . Diverticulosis   . Elevated liver enzymes 2007  . History of IBS   . Hyperlipidemia   . Hypertension   . Nonischemic cardiomyopathy ([place])   . pernicious anemia     Past Surgical History:  Procedure Laterality Date  . ANKLE FRACTURE SURGERY    . BIV ICD INSERTION CRT-D N/A 01/03/2018    [facility] [employer] MP model [phone] (serial  Number [id number]) biventricular ICD for primary prevention of sudden death  . BREAST BIOPSY     right breast  . BREAST RECONSTRUCTION WITH PLACEMENT OF TISSUE EXPANDER AND FLEX HD (ACELLULAR HYDRATED DERMIS) Right 01/28/2014   Procedure: RIGHT BREAST RECONSTRUCTION WITH PLACEMENT OF TISSUE EXPANDER;  Surgeon: Crissie Reese, MD;  Location: [place];  Service: Plastics;  Laterality: Right;  . CARDIAC CATHETERIZATION     x3; 03/18/12 [facility] [place]): 10-20% pLAD, <20% ostial LCX, 20% mPLA, EF 60%.    . CHOLECYSTECTOMY    . COLONOSCOPY     x4  . DILATION AND CURETTAGE OF UTERUS     . MASTECTOMY Right    malignant  . REDUCTION MAMMAPLASTY Left   . RHINOPLASTY    . SIMPLE MASTECTOMY WITH AXILLARY SENTINEL NODE BIOPSY Right 01/28/2014   Procedure: RIGHT TOTAL MASTECTOMY WITH  RIGHT AXILLARY SENTINEL NODE BIOPSY;  Surgeon: Excell Seltzer, MD;  Location: [place];  Service: General;  Laterality: Right;  . TONSILLECTOMY AND ADENOIDECTOMY    . TUBAL LIGATION      There were no vitals filed for this visit.   Subjective Assessment - 05/15/18 1428    Subjective  My shoulders have been tight for a long time. It has gotten worse since fall. I have pain in my right pinky that is burning. My back hurts and it is hard to sit up straight.     Pertinent History  R breast cancer DCIS, ER/PR positive, HER 2-, R mastectomy 11/12/2013, CHF with pacemaker placement, alpha-gal (disease from tick bite)    Patient Stated Goals  to ease pain and increase movement, increase strength in upper body    Currently in Pain?  Yes    Pain Score  1     Pain Location  Back    Pain Orientation  Upper    Pain Descriptors / Indicators  Tightness  Pain Type  Chronic pain    Pain Onset  1 to 4 weeks ago    Pain Frequency  Intermittent    Aggravating Factors   nothing    Pain Relieving Factors  heat    Effect of Pain on Daily Activities  does not effect         [facility] PT Assessment - 05/15/18 0001      Assessment   Medical Diagnosis  R breast cancer    Referring Provider (PT)  [place]    Onset Date/Surgical Date  11/12/13    Hand Dominance  Right    Prior Therapy  in 2018 for shoulder ROM at another facility      Precautions   Precautions  ICD/Pacemaker      Restrictions   Weight Bearing Restrictions  No      Balance Screen   Has the patient fallen in the past 6 months  Yes    How many times?  1    Has the patient had a decrease in activity level because of a fear of falling?   No    Is the patient reluctant to leave their home because of a fear of falling?   No      Home  Environment   Living Environment  Private residence    Living Arrangements  Spouse/significant other;Children    Available Help at Discharge  Family    Type of Bath Access  Level entry    [place]  Two level;Able to live on main level with bedroom/bathroom;Laundry or work area in basement    Alternate Therapist, sports of Steps  15    Alternate Level Stairs-Rails  Can reach both      Prior Function   Level of [place]  Retired    Leisure  pt does not exercise      Cognition   Overall Cognitive Status  Within Functional Limits for tasks assessed      Observation/Other Assessments   Observations  bilateral scapular winging right worse than left      Posture/Postural Control   Posture/Postural Control  Postural limitations    Postural Limitations  Rounded Shoulders;Forward head      ROM / Strength   AROM / PROM / Strength  AROM      AROM   Right Shoulder Flexion  147 Degrees    Right Shoulder ABduction  131 Degrees    Right Shoulder Internal Rotation  37 Degrees    Right Shoulder External Rotation  83 Degrees    Left Shoulder Flexion  157 Degrees    Left Shoulder ABduction  149 Degrees    Left Shoulder Internal Rotation  74 Degrees    Left Shoulder External Rotation  74 Degrees                Objective measurements completed on examination: See above findings.      [facility] Adult PT Treatment/Exercise - 05/15/18 0001      Exercises   Exercises  Shoulder      Shoulder Exercises: Supine   Flexion  AAROM;Both;10 reps   with 5-10 sec holds pt returned therapist demo   ABduction  AAROM;Both;10 reps   5-10 sec holds, pt returned therapist demo                 PT Long Term Goals - 05/15/18 1523      PT LONG TERM  GOAL #1   Title  Pt will demonstrate 150 degrees of bilateral shoulder abduction to allow her to reach out to the side    Baseline  R 131, L 149    Time  4    Period  Weeks    Status  New     Target Date  06/12/18      PT LONG TERM GOAL #2   Title  Pt will demonstrate improved scapular strength as evidenced by decreased winging of scapula    Baseline  bilateral winging with right worse than left    Time  4    Period  Weeks    Status  New    Target Date  06/12/18      PT LONG TERM GOAL #3   Title  Pt will demonstrate 160 degrees of bilateral shoulder flexion to allow her to reach overhead    Baseline  R 147 L 157    Time  4    Period  Weeks    Status  New    Target Date  06/12/18      PT LONG TERM GOAL #4   Title  Pt will be independent in a home exercise program for continued stretching and strengthening    Time  4    Period  Weeks    Status  New    Target Date  06/12/18             Plan - 05/15/18 1516    Clinical Impression Statement  Pt presents to PT with increased shoulder and UE pain and decrease bilateral shoulder ROM following treatments for right breast cancer. Pt had a right mastectomy on 11/12/13 with implant placement and a left breast reduction. She has had a pacemaker implanted and she states after that she did not raise her arm much. She has been cleared now to raise her arms. She has a lot of scapular weakness and both scapula wing. She has rounded shoulders and forward head. Pt would benefit from skilled PT services to increase bilateral shoulder ROM and strength to improve function and decrease pain.     History and Personal Factors relevant to plan of care:  CHF, pacemaker, alpha-gal disease    Clinical Presentation  Evolving    Clinical Presentation due to:  pt reports she retains fluid easily from food, has numerous issues with alpha gal disease, past history of shoulder issues    Rehab Potential  Good    Clinical Impairments Affecting Rehab Potential  long standing hx of shoulder problems    PT Frequency  2x / week    PT Duration  4 weeks    PT Treatment/Interventions  ADLs/Self Care Home Management;Therapeutic activities;Therapeutic  exercise;Manual techniques;Patient/family education;Passive range of motion;Taping;Joint Manipulations    PT Next Visit Plan  pt has pacemaker, assess shoulder strength and add goal,  begin ROM to bilateral shoulders, scapular strengthening exercises to decrease winging, UE strengthening exercises    PT Home Exercise Plan  supine cane exercises    Consulted and Agree with Plan of Care  Patient       Patient will benefit from skilled therapeutic intervention in order to improve the following deficits and impairments:  Pain, Postural dysfunction, Decreased range of motion, Decreased strength, Impaired UE functional use  Visit Diagnosis: Stiffness of right shoulder, not elsewhere classified  Stiffness of left shoulder, not elsewhere classified  Muscle weakness (generalized)  Pain in right finger(s)  Abnormal posture     Problem  List Patient Active Problem List   Diagnosis Date Noted  . Hypertension 01/04/2018  . Hyperlipidemia 01/04/2018  . ICD (implantable cardioverter-defibrillator), biventricular, in situ 01/04/2018  . Chronic systolic dysfunction of left ventricle 01/03/2018  . Pernicious anemia 01/12/2016  . Depression, major, in remission ([place]) 07/07/2015  . Genetic testing 12/23/2014  . Family history of malignant neoplasm of breast 12/16/2013  . Family history of malignant neoplasm of gastrointestinal tract 12/16/2013  . Family history of malignant neoplasm of ovary 12/16/2013  . Breast cancer of upper-outer quadrant of right female breast ([place]) 11/12/2013    Allyson Sabal [facility] 05/15/2018, 3:26 PM  [place], [place], [address] Phone: [phone]   Fax:  [phone]  Name: Loretta Thomas MRN: [id number] Date of Birth: [birth year]  Manus Gunning, PT 05/15/18 3:27 PM

## 2018-05-15 NOTE — Patient Instructions (Signed)
Shoulder: Flexion (Supine)    With hands shoulder width apart, slowly lower dowel to floor behind head. Do not let elbows bend. Keep back flat. Hold _5-15___ seconds. Repeat _10___ times. Do __2__ sessions per day. CAUTION: Stretch slowly and gently.  Copyright  VHI. All rights reserved.  Shoulder: Abduction (Supine)    With right arm flat on floor, hold dowel in palm. Slowly move arm up to side of head by pushing with opposite arm. Do not let elbow bend. Hold _5-15___ seconds. Repeat _10___ times. Do __2__ sessions per day. CAUTION: Stretch slowly and gently.  Copyright  VHI. All rights reserved.

## 2018-05-16 ENCOUNTER — Ambulatory Visit: Payer: BLUE CROSS/BLUE SHIELD | Admitting: Physical Therapy

## 2018-05-16 ENCOUNTER — Encounter: Payer: Self-pay | Admitting: Physical Therapy

## 2018-05-16 DIAGNOSIS — M25612 Stiffness of left shoulder, not elsewhere classified: Secondary | ICD-10-CM

## 2018-05-16 DIAGNOSIS — M6281 Muscle weakness (generalized): Secondary | ICD-10-CM

## 2018-05-16 DIAGNOSIS — M25611 Stiffness of right shoulder, not elsewhere classified: Secondary | ICD-10-CM

## 2018-05-16 DIAGNOSIS — R293 Abnormal posture: Secondary | ICD-10-CM

## 2018-05-16 DIAGNOSIS — M79644 Pain in right finger(s): Secondary | ICD-10-CM

## 2018-05-16 NOTE — Therapy (Signed)
[place], [place], [address] Phone: [phone]   Fax:  [phone]  Physical Therapy Treatment  Patient Details  Name: Loretta Thomas MRN: [id number] Date of Birth: 06/16/1950 Referring Provider (PT): Lindi Adie   Encounter Date: 05/16/2018  PT End of Session - 05/16/18 1038    Visit Number  2    Number of Visits  9    Date for PT Re-Evaluation  06/12/18    PT Start Time  0930    PT Stop Time  1015    PT Time Calculation (min)  45 min    Activity Tolerance  Patient tolerated treatment well    Behavior During Therapy  [facility] for tasks assessed/performed       Past Medical History:  Diagnosis Date  . Anxiety   . Bladder cystocele   . Breast cancer ([place])    s/p L breast cancer removal  . Bundle branch block, left    diagnosed 03/2012 followed by cath showing only minimal CAD  . Bursitis   . Cancer ([place])    right breast  . Capsulitis   . Diverticulosis   . Elevated liver enzymes 2007  . History of IBS   . Hyperlipidemia   . Hypertension   . Nonischemic cardiomyopathy ([place])   . pernicious anemia     Past Surgical History:  Procedure Laterality Date  . ANKLE FRACTURE SURGERY    . BIV ICD INSERTION CRT-D N/A 01/03/2018    [facility] [employer] MP model [phone] (serial  Number [id number]) biventricular ICD for primary prevention of sudden death  . BREAST BIOPSY     right breast  . BREAST RECONSTRUCTION WITH PLACEMENT OF TISSUE EXPANDER AND FLEX HD (ACELLULAR HYDRATED DERMIS) Right 01/28/2014   Procedure: RIGHT BREAST RECONSTRUCTION WITH PLACEMENT OF TISSUE EXPANDER;  Surgeon: Crissie Reese, MD;  Location: [place];  Service: Plastics;  Laterality: Right;  . CARDIAC CATHETERIZATION     x3; 03/18/12 [facility] [place]): 10-20% pLAD, <20% ostial LCX, 20% mPLA, EF 60%.    . CHOLECYSTECTOMY    . COLONOSCOPY     x4  . DILATION AND CURETTAGE OF UTERUS    . MASTECTOMY Right    malignant  . REDUCTION MAMMAPLASTY Left   . RHINOPLASTY    . SIMPLE MASTECTOMY WITH AXILLARY SENTINEL NODE BIOPSY Right 01/28/2014   Procedure: RIGHT TOTAL MASTECTOMY WITH  RIGHT AXILLARY SENTINEL NODE BIOPSY;  Surgeon: Excell Seltzer, MD;  Location: [place];  Service: General;  Laterality: Right;  . TONSILLECTOMY AND ADENOIDECTOMY    . TUBAL LIGATION      There were no vitals filed for this visit.  Subjective Assessment - 05/16/18 0945    Subjective  Pt is concerned about the winging of her scapula . She also to drive 1 1/2 hours to get her and may consider going to [facility] for her therapy . She occasionally as pain in right 4th and 5th finger     Pertinent History  R breast cancer DCIS, ER/PR positive, HER 2-, R mastectomy 11/12/2013, CHF with pacemaker placement, alpha-gal (disease from tick bite)    Patient Stated Goals  to ease pain and increase movement, increase strength in upper body    Currently in Pain?  No/denies         [facility] PT Assessment - 05/16/18 0001      ROM / Strength   AROM / PROM / Strength  Strength      Strength  Overall Strength  Deficits    Overall Strength Comments  pt appears to have general deconditioning, occasional dyspnea with activtiy and tends to hold her breath with exercise effort     Strength Assessment Site  Shoulder    Right/Left Shoulder  Right;Left    Right Shoulder Flexion  4-/5    Right Shoulder ABduction  4-/5    Left Shoulder Flexion  4-/5    Left Shoulder ABduction  4-/5                   [facility] Adult PT Treatment/Exercise - 05/16/18 0001      Exercises   Exercises  Shoulder;Other Exercises    Other Exercises   instructed in walking program       Shoulder Exercises: Supine   Horizontal ABduction  Strengthening;Right;Left;5 reps;Theraband    Theraband Level (Shoulder Horizontal ABduction)  Level 1 (Yellow)    Flexion  Strengthening;Right;Left;5 reps   narrow and wide grip   Theraband Level (Shoulder Flexion)  Level  1 (Yellow)      Shoulder Exercises: Sidelying   External Rotation  AROM;Strengthening;Right;10 reps    External Rotation Limitations  isometric strengthening 5 x 5 seconds , winging of right scapula evident       Shoulder Exercises: Standing   Row  Strengthening;Right;Left;10 reps;Theraband    Theraband Level (Shoulder Row)  Level 1 (Yellow)    Row Limitations  cues to keep breathing and not hold her breath       Manual Therapy   Manual Therapy  Soft tissue mobilization;Passive ROM    Manual therapy comments  Pt has episode very tight tender muscles in left upper traps after lying on left side for exercise     Soft tissue mobilization  in sitting, with thick massage cream, soft tissue mobilzation to left and right upper traps with decrease in tissue tightness and pain     Passive ROM  to right shoulder in limits of flexion             PT Education - 05/16/18 1038    Education Details  supine scapular series, walking program     Person(s) Educated  Patient    Methods  Explanation;Handout;Demonstration    Comprehension  Verbalized understanding;Returned demonstration          PT Long Term Goals - 05/15/18 1523      PT LONG TERM GOAL #1   Title  Pt will demonstrate 150 degrees of bilateral shoulder abduction to allow her to reach out to the side    Baseline  R 131, L 149    Time  4    Period  Weeks    Status  New    Target Date  06/12/18      PT LONG TERM GOAL #2   Title  Pt will demonstrate improved scapular strength as evidenced by decreased winging of scapula    Baseline  bilateral winging with right worse than left    Time  4    Period  Weeks    Status  New    Target Date  06/12/18      PT LONG TERM GOAL #3   Title  Pt will demonstrate 160 degrees of bilateral shoulder flexion to allow her to reach overhead    Baseline  R 147 L 157    Time  4    Period  Weeks    Status  New    Target Date  06/12/18  PT LONG TERM GOAL #4   Title  Pt will be  independent in a home exercise program for continued stretching and strengthening    Time  4    Period  Weeks    Status  New    Target Date  06/12/18            Plan - 05/16/18 1043    Clinical Impression Statement  Worked on scapular strengthening in supine and standing. Pt with pain and tenderness in left upper trap after lying on left side, so performed soft tissue work to this area with symptomatic relief.  Pt may consider getting therapy at [facility] since it is much closer to her home.    Clinical Impairments Affecting Rehab Potential  long standing hx of shoulder problems    PT Frequency  2x / week    PT Duration  4 weeks    PT Next Visit Plan  Review scapular strengthening exercises to decrease winging, UE strengthening exercises, progress to [place] next session assess if she is walking and consider     Consulted and Agree with Plan of Care  Patient       Patient will benefit from skilled therapeutic intervention in order to improve the following deficits and impairments:  Pain, Postural dysfunction, Decreased range of motion, Decreased strength, Impaired UE functional use  Visit Diagnosis: Stiffness of right shoulder, not elsewhere classified  Stiffness of left shoulder, not elsewhere classified  Muscle weakness (generalized)  Pain in right finger(s)  Abnormal posture     Problem List Patient Active Problem List   Diagnosis Date Noted  . Hypertension 01/04/2018  . Hyperlipidemia 01/04/2018  . ICD (implantable cardioverter-defibrillator), biventricular, in situ 01/04/2018  . Chronic systolic dysfunction of left ventricle 01/03/2018  . Pernicious anemia 01/12/2016  . Depression, major, in remission ([place]) 07/07/2015  . Genetic testing 12/23/2014  . Family history of malignant neoplasm of breast 12/16/2013  . Family history of malignant neoplasm of gastrointestinal tract 12/16/2013  . Family history of malignant neoplasm of ovary 12/16/2013  . Breast cancer  of upper-outer quadrant of right female breast ([place]) 11/12/2013   Donato Heinz. Owens Shark PT  [place] Levo 05/16/2018, 10:49 AM  [place], [place], [address] Phone: [phone]   Fax:  [phone]  Name: Loretta Thomas MRN: [id number] Date of Birth: Jun 13, 1950

## 2018-05-16 NOTE — Patient Instructions (Addendum)
Over Head Pull: Narrow and Wide Grip   Cancer Rehab [phone]   On back, knees bent, feet flat, band across thighs, elbows straight but relaxed. Pull hands apart (start). Keeping elbows straight, bring arms up and over head, hands toward floor. Keep pull steady on band. Hold momentarily. Return slowly, keeping pull steady, back to start. Then do same with a wider grip on the band (past shoulder width) Repeat _5-10__ times. Band color __yellow____   Side Pull: Double Arm   On back, knees bent, feet flat. Arms perpendicular to body, shoulder level, elbows straight but relaxed. Pull arms out to sides, elbows straight. Resistance band comes across collarbones, hands toward floor. Hold momentarily. Slowly return to starting position. Repeat _5-10__ times. Band color _yellow____   Sword   On back, knees bent, feet flat, left hand on left hip, right hand above left. Pull right arm DIAGONALLY (hip to shoulder) across chest. Bring right arm along head toward floor. Hold momentarily. Slowly return to starting position. Repeat _5-10__ times. Do with left arm. Band color _yellow_____   Shoulder Rotation: Double Arm   On back, knees bent, feet flat, elbows tucked at sides, bent 90, hands palms up. Pull hands apart and down toward floor, keeping elbows near sides. Hold momentarily. Slowly return to starting position. Repeat _5-10__ times. Band color __yellow____     [facility]  Walking is a great form of exercise to increase your strength, endurance and overall fitness.  A walking program can help you start slowly and gradually build endurance as you go.  Everyone's ability is different, so each person's starting point will be different.  You do not have to follow them exactly.  The are just samples. You should simply find out what's right for you and stick to that program.   In the beginning, you'll start off walking 2-3 times a day for short distances.  As you get stronger, you'll be walking further at  just 1-2 times per day.  A. You Can Walk For A Certain Length Of Time Each Day    Walk 5 minutes 3 times per day.  Increase 2 minutes every 2 days (3 times per day).  Work up to 25-30 minutes (1-2 times per day).   Example:   Day 1-2 5 minutes 3 times per day   Day 7-8 12 minutes 2-3 times per day   Day 13-14 25 minutes 1-2 times per day  B. You Can Walk For a Certain Distance Each Day     Distance can be substituted for time.    Example:   3 trips to mailbox (at road)   3 trips to corner of block   3 trips around the block  C. Go to local [employer] and use the track.    Walk for distance ____ around track  Or time ____ minutes  D. Walk ____ Jog ____ Run ___  Please only do the exercises that your therapist has initialed and dated

## 2018-05-20 ENCOUNTER — Ambulatory Visit: Payer: BLUE CROSS/BLUE SHIELD | Attending: Hematology and Oncology

## 2018-05-20 DIAGNOSIS — R293 Abnormal posture: Secondary | ICD-10-CM | POA: Diagnosis present

## 2018-05-20 DIAGNOSIS — M79644 Pain in right finger(s): Secondary | ICD-10-CM

## 2018-05-20 DIAGNOSIS — M25611 Stiffness of right shoulder, not elsewhere classified: Secondary | ICD-10-CM

## 2018-05-20 DIAGNOSIS — M25612 Stiffness of left shoulder, not elsewhere classified: Secondary | ICD-10-CM | POA: Diagnosis present

## 2018-05-20 DIAGNOSIS — M6281 Muscle weakness (generalized): Secondary | ICD-10-CM | POA: Diagnosis present

## 2018-05-20 NOTE — Patient Instructions (Addendum)
Strengthening: Resisted Flexion   Cancer Rehab [phone]    Hold tubing with right arm at side. Pull forward and up. Move shoulder through pain-free range of motion. Repeat __5-10__ times per set. Do _1-2___ sessions per day.  Strengthening: Resisted Internal Rotation    Hold tubing in right hand, elbow at side and forearm out. Rotate forearm in across body. Repeat __5-10__ times per set. Do _1-2___ sessions per day.  Strengthening: Resisted Extension    Hold tubing in right hand, arm forward. Pull arm back, elbow straight. Try to pinch shoulder blade towards spine at end of motion. Repeat _5-10___ times per set. Do _1-2___ sessions per day.  Strengthening: Resisted External Rotation    Hold tubing in right hand, elbow at side and forearm across body. Rotate forearm out. Try to pinch shoulder blade together at end. Repeat _5-10___ times per set. Do __1-2__ sessions per day.

## 2018-05-20 NOTE — Therapy (Signed)
[place], [place], [address] Phone: [phone]   Fax:  [phone]  Physical Therapy Treatment  Patient Details  Name: Loretta Thomas MRN: [id number] Date of Birth: 08-11-1950 Referring Provider (PT): Lindi Adie   Encounter Date: 05/20/2018  PT End of Session - 05/20/18 1019    Visit Number  3    Number of Visits  9    Date for PT Re-Evaluation  06/12/18    PT Start Time  0942   pt arrived late   PT Stop Time  1017    PT Time Calculation (min)  35 min    Activity Tolerance  Patient tolerated treatment well    Behavior During Therapy  [facility] for tasks assessed/performed       Past Medical History:  Diagnosis Date  . Anxiety   . Bladder cystocele   . Breast cancer ([place])    s/p L breast cancer removal  . Bundle branch block, left    diagnosed 03/2012 followed by cath showing only minimal CAD  . Bursitis   . Cancer ([place])    right breast  . Capsulitis   . Diverticulosis   . Elevated liver enzymes 2007  . History of IBS   . Hyperlipidemia   . Hypertension   . Nonischemic cardiomyopathy ([place])   . pernicious anemia     Past Surgical History:  Procedure Laterality Date  . ANKLE FRACTURE SURGERY    . BIV ICD INSERTION CRT-D N/A 01/03/2018    [facility] Assura MP model [phone] (serial  Number [id number]) biventricular ICD for primary prevention of sudden death  . BREAST BIOPSY     right breast  . BREAST RECONSTRUCTION WITH PLACEMENT OF TISSUE EXPANDER AND FLEX HD (ACELLULAR HYDRATED DERMIS) Right 01/28/2014   Procedure: RIGHT BREAST RECONSTRUCTION WITH PLACEMENT OF TISSUE EXPANDER;  Surgeon: Crissie Reese, MD;  Location: [facility];  Service: Plastics;  Laterality: Right;  . CARDIAC CATHETERIZATION     x3; 03/18/12 [facility]): 10-20% pLAD, <20% ostial LCX, 20% mPLA, EF 60%.    . CHOLECYSTECTOMY    . COLONOSCOPY     x4  . DILATION AND CURETTAGE OF UTERUS    .  MASTECTOMY Right    malignant  . REDUCTION MAMMAPLASTY Left   . RHINOPLASTY    . SIMPLE MASTECTOMY WITH AXILLARY SENTINEL NODE BIOPSY Right 01/28/2014   Procedure: RIGHT TOTAL MASTECTOMY WITH  RIGHT AXILLARY SENTINEL NODE BIOPSY;  Surgeon: Excell Seltzer, MD;  Location: [place];  Service: General;  Laterality: Right;  . TONSILLECTOMY AND ADENOIDECTOMY    . TUBAL LIGATION      There were no vitals filed for this visit.  Subjective Assessment - 05/20/18 0943    Subjective  I felt so good after last session! But then I think I overdid it with my HEP bc I was feeling so good. I noticed the tingling in my Rt fifth finger was gone on the drive over today so that's a good sign.     Pertinent History  R breast cancer DCIS, ER/PR positive, HER 2-, R mastectomy 11/12/2013, CHF with pacemaker placement, alpha-gal (disease from tick bite)    Patient Stated Goals  to ease pain and increase movement, increase strength in upper body    Currently in Pain?  No/denies                       [facility] Adult PT Treatment/Exercise - 05/20/18  0001      Shoulder Exercises: Standing   External Rotation  Strengthening;Right;10 reps;Theraband    Theraband Level (Shoulder External Rotation)  Level 2 (Red)    Internal Rotation  Strengthening;Right;10 reps    Theraband Level (Shoulder Internal Rotation)  Level 2 (Red)    Flexion  Strengthening;Right    Theraband Level (Shoulder Flexion)  Level 2 (Red)    Extension  Strengthening;Right;10 reps;Theraband    Theraband Level (Shoulder Extension)  Level 2 (Red)    Extension Limitations  VCs to increase Rt scapular retraction at end of motion      Manual Therapy   Soft tissue mobilization  In Supine briefly to Rt upper trap area where pt c/o have min tightness    Passive ROM  to right shoulder in supine into flexion, abduction and D2 with scapular depression thorughout. Pt with much improved end P/ROM by end of session             PT Education -  05/20/18 0958    Education Details  Rt [place] with red theraband    Person(s) Educated  Patient    Methods  Explanation;Demonstration;Handout    Comprehension  Verbalized understanding;Returned demonstration;Need further instruction          PT Long Term Goals - 05/15/18 1523      PT LONG TERM GOAL #1   Title  Pt will demonstrate 150 degrees of bilateral shoulder abduction to allow her to reach out to the side    Baseline  R 131, L 149    Time  4    Period  Weeks    Status  New    Target Date  06/12/18      PT LONG TERM GOAL #2   Title  Pt will demonstrate improved scapular strength as evidenced by decreased winging of scapula    Baseline  bilateral winging with right worse than left    Time  4    Period  Weeks    Status  New    Target Date  06/12/18      PT LONG TERM GOAL #3   Title  Pt will demonstrate 160 degrees of bilateral shoulder flexion to allow her to reach overhead    Baseline  R 147 L 157    Time  4    Period  Weeks    Status  New    Target Date  06/12/18      PT LONG TERM GOAL #4   Title  Pt will be independent in a home exercise program for continued stretching and strengthening    Time  4    Period  Weeks    Status  New    Target Date  06/12/18            Plan - 05/20/18 1019    Clinical Impression Statement  Reviewed supine scapular series today breifly and pt was doing this well except neede demo for correct technique with er, then able to do correctly. Progressed HEP to include [place] which she tolerated well though pt did want to review ,muliple times to make sure she was doing it right. Then focused on Rt UE stretching and soft tissue work and pt reported shoulder and upper quadrant in general felt better at end of session.    Rehab Potential  Good    Clinical Impairments Affecting Rehab Potential  long standing hx of shoulder problems    PT Frequency  2x / week  PT Duration  4 weeks    PT Treatment/Interventions  ADLs/Self Care Home  Management;Therapeutic activities;Therapeutic exercise;Manual techniques;Patient/family education;Passive range of motion;Taping;Joint Manipulations    PT Next Visit Plan  Briefly review HEPs prn for scapular strength and then focus on manual therapy to bil shoulders/upper quadrants.    Consulted and Agree with Plan of Care  Patient       Patient will benefit from skilled therapeutic intervention in order to improve the following deficits and impairments:  Pain, Postural dysfunction, Decreased range of motion, Decreased strength, Impaired UE functional use  Visit Diagnosis: Stiffness of right shoulder, not elsewhere classified  Stiffness of left shoulder, not elsewhere classified  Muscle weakness (generalized)  Pain in right finger(s)  Abnormal posture     Problem List Patient Active Problem List   Diagnosis Date Noted  . Hypertension 01/04/2018  . Hyperlipidemia 01/04/2018  . ICD (implantable cardioverter-defibrillator), biventricular, in situ 01/04/2018  . Chronic systolic dysfunction of left ventricle 01/03/2018  . Pernicious anemia 01/12/2016  . Depression, major, in remission ([place]) 07/07/2015  . Genetic testing 12/23/2014  . Family history of malignant neoplasm of breast 12/16/2013  . Family history of malignant neoplasm of gastrointestinal tract 12/16/2013  . Family history of malignant neoplasm of ovary 12/16/2013  . Breast cancer of upper-outer quadrant of right female breast ([place]) 11/12/2013    Otelia Limes, PTA 05/20/2018, 11:04 AM  [place], [place], [address] Phone: [phone]   Fax:  [phone]  Name: Shahara Hartsfield MRN: [id number] Date of Birth: [birth year]

## 2018-05-21 ENCOUNTER — Ambulatory Visit (INDEPENDENT_AMBULATORY_CARE_PROVIDER_SITE_OTHER): Payer: BLUE CROSS/BLUE SHIELD

## 2018-05-21 ENCOUNTER — Telehealth: Payer: Self-pay | Admitting: *Deleted

## 2018-05-21 DIAGNOSIS — I428 Other cardiomyopathies: Secondary | ICD-10-CM | POA: Diagnosis not present

## 2018-05-21 NOTE — Telephone Encounter (Signed)
Notes recorded by Laurine Blazer, LPN on 05/21/4999 at 6:42 PM EST Patient notified. Copy to pmd. Follow up scheduled for 05/27/18 with Dr. Paralee Cancel office.   ------  Notes recorded by Herminio Commons, MD on 05/21/2018 at 12:40 PM EST Pumping function remains severely reduced.

## 2018-05-25 ENCOUNTER — Other Ambulatory Visit: Payer: Self-pay | Admitting: Cardiology

## 2018-05-25 DIAGNOSIS — I5022 Chronic systolic (congestive) heart failure: Secondary | ICD-10-CM

## 2018-05-25 MED ORDER — FUROSEMIDE 40 MG PO TABS: 40.0000 mg | ORAL_TABLET | Freq: Every day | ORAL | 0 refills | Status: DC

## 2018-05-26 ENCOUNTER — Encounter: Payer: BLUE CROSS/BLUE SHIELD | Admitting: Physical Therapy

## 2018-05-26 ENCOUNTER — Telehealth: Payer: Self-pay

## 2018-05-26 ENCOUNTER — Ambulatory Visit (INDEPENDENT_AMBULATORY_CARE_PROVIDER_SITE_OTHER): Payer: BLUE CROSS/BLUE SHIELD

## 2018-05-26 DIAGNOSIS — I5022 Chronic systolic (congestive) heart failure: Secondary | ICD-10-CM

## 2018-05-26 DIAGNOSIS — Z95810 Presence of automatic (implantable) cardiac defibrillator: Secondary | ICD-10-CM

## 2018-05-26 NOTE — Telephone Encounter (Signed)
Spoke with patient and requested she send manual remote transmission for review.  Advised will send copy to Dr Bronson Ing for his review at her office appt tomorrow, 05/27/2018.  She said to call her back that she would be home in about 15 minutes.  She said she missed taking Furosemide x 10 days because she thought the pharmacy had given her a refill but it was actually one of her other prescriptions.  She has gained 8 lbs but today started the Furosemide which is helping her to eliminate the fluid.

## 2018-05-26 NOTE — Progress Notes (Signed)
EPIC Encounter for ICM Monitoring  Patient Name: Loretta Thomas is a 68 y.o. female Date: 05/26/2018 Primary Care Physican: Rossie Muskrat, DO Primary Cardiologist: Bronson Ing Electrophysiologist: Allred Bi-V Pacing:   >99%       Last Weight: 176 lbs  Today's Weight: 173 lbs       Heart Failure questions reviewed.   Patient concerned over low EF and read online the life expectancy of HF patient is 5 years.  Advised her to talk with Dr Bronson Ing tomorrow regarding her condition and concerns.   Pt gained 8 lbs in 10 days and highest weight was 181 lbs.  She also felt a lot of chest pressure yesterday.  She placed the wrong pills in her pill box and realized she was not taking Furosemide x 10 days. She restarted the Furosemide yesterday and weigh dropped to 173 lbs today and no longer has any chest pressure.       Report: Thoracic impedance returned to normal after taking Furosemide correctly.   Prescribed: Furosemide 40 mg take 1 tablet daily. Potassium 20 mEq 1 tablet daily  Labs: 01/04/2018 Creatinine 0.69, BUN 10, Potassium 4.0, Sodium 141, eGFR >60  12/30/2017 Creatinine 0.81, BUN 12, Potassium 4.1, Sodium 143, eGFR 76-88  A complete set of results can be found in Results Review.  Recommendations:  Advised to call if she gains more than 3 lbs overnight or 5 lbs in a week or she becomes Brennen Gardiner of breath. .  Follow-up plan: ICM clinic phone appointment on 06/23/2018.   Office appointment scheduled 05/27/2018 with Dr. Bronson Ing.    Copy of ICM check sent to Dr. Rayann Heman and Dr Bronson Ing.   3 month ICM trend: 05/26/2018    1 Year ICM trend:       [name], RN 05/26/2018 5:27 PM

## 2018-05-26 NOTE — Telephone Encounter (Signed)
Call back to patient and assisted sending remote transmission. Transmission reviewed.

## 2018-05-27 ENCOUNTER — Ambulatory Visit (INDEPENDENT_AMBULATORY_CARE_PROVIDER_SITE_OTHER): Payer: BLUE CROSS/BLUE SHIELD | Admitting: Cardiovascular Disease

## 2018-05-27 ENCOUNTER — Encounter: Payer: Self-pay | Admitting: Cardiovascular Disease

## 2018-05-27 VITALS — BP 120/79 | HR 80 | Ht 67.0 in | Wt 181.0 lb

## 2018-05-27 DIAGNOSIS — I10 Essential (primary) hypertension: Secondary | ICD-10-CM

## 2018-05-27 DIAGNOSIS — I447 Left bundle-branch block, unspecified: Secondary | ICD-10-CM | POA: Diagnosis not present

## 2018-05-27 DIAGNOSIS — I5022 Chronic systolic (congestive) heart failure: Secondary | ICD-10-CM

## 2018-05-27 DIAGNOSIS — I428 Other cardiomyopathies: Secondary | ICD-10-CM

## 2018-05-27 DIAGNOSIS — Z95810 Presence of automatic (implantable) cardiac defibrillator: Secondary | ICD-10-CM | POA: Diagnosis not present

## 2018-05-27 DIAGNOSIS — I519 Heart disease, unspecified: Secondary | ICD-10-CM

## 2018-05-27 MED ORDER — FUROSEMIDE 40 MG PO TABS: 40.0000 mg | ORAL_TABLET | Freq: Every day | ORAL | 1 refills | Status: DC

## 2018-05-27 NOTE — Patient Instructions (Signed)

## 2018-05-27 NOTE — Progress Notes (Signed)
SUBJECTIVE: The patient presents for routine follow-up.  It appears she had missed taking furosemide for 10 days regarding some confusion about her medications.  She gained about 8 pounds but this resolved after medication resumption.  Echocardiogram on 05/21/2018 showed severely reduced left ventricular systolic function, LVEF 28%.  There was mild left ventricular dilatation, diastolic dysfunction, and elevated left ventricular filling pressures.  She underwent biventricular ICD implantation in September 2019.  She had mild nonobstructive coronary disease by coronary angiography in December 2013 in [place], [place].  She is here with her sister.  She told me she had acute sinusitis and bronchitis around Christmas time.  After resuming Lasix, she no longer has any chest tightness.  She denies orthopnea, leg swelling, palpitations, and paroxysmal nocturnal dyspnea.  She denies ICD shocks.  Past medical history also includes right-sided breast cancer for which she underwent mastectomy and was treated with Arimidex and letrozole.    Review of Systems: As per "subjective", otherwise negative.  Allergies  Allergen Reactions  . Codeine     Stomach Pains  . Demerol [Meperidine]     Upset stomach  . Hydrocodone   . Lortab [Hydrocodone-Acetaminophen]     Upset stomach    Current Outpatient Medications  Medication Sig Dispense Refill  . Calcium Carbonate-Vitamin D 600-400 MG-UNIT tablet Take 1 tablet by mouth 2 (two) times daily.    . carvedilol (COREG) 3.125 MG tablet Take 1 tablet (3.125 mg total) by mouth 2 (two) times daily with a meal. 60 tablet 6  . cyanocobalamin (,VITAMIN B-12,) 1000 MCG/ML injection Inject 1 mL (1,000 mcg total) into the muscle every 30 (thirty) days. 10 mL 0  . famotidine (PEPCID) 40 MG tablet Take 40 mg by mouth daily.  6  . fexofenadine (ALLEGRA) 180 MG tablet Take 180 mg by mouth daily.  6  . furosemide (LASIX) 40 MG tablet Take 1 tablet (40 mg  total) by mouth daily. 90 tablet 0  . letrozole (FEMARA) 2.5 MG tablet Take 1 tablet (2.5 mg total) by mouth daily. 90 tablet 3  . losartan (COZAAR) 50 MG tablet Take 1 tablet (50 mg total) by mouth daily. 90 tablet 2  . potassium chloride SA (K-DUR,KLOR-CON) 20 MEQ tablet Take 1 tablet (20 mEq total) by mouth daily. 90 tablet 2  . triamcinolone cream (KENALOG) 0.1 % Apply 1 application topically 2 (two) times daily as needed for itching.    . venlafaxine XR (EFFEXOR-XR) 37.5 MG 24 hr capsule Take 1 capsule (37.5 mg total) by mouth daily with breakfast. 30 capsule 5  . Vitamin D, Ergocalciferol, (DRISDOL) 50000 UNITS CAPS capsule Take 50,000 Units by mouth every 7 (seven) days. Fridays     No current facility-administered medications for this visit.     Past Medical History:  Diagnosis Date  . Anxiety   . Bladder cystocele   . Breast cancer ([place])    s/p L breast cancer removal  . Bundle branch block, left    diagnosed 03/2012 followed by cath showing only minimal CAD  . Bursitis   . Cancer ([place])    right breast  . Capsulitis   . Diverticulosis   . Elevated liver enzymes 2007  . History of IBS   . Hyperlipidemia   . Hypertension   . Nonischemic cardiomyopathy ([place])   . pernicious anemia     Past Surgical History:  Procedure Laterality Date  . ANKLE FRACTURE SURGERY    . BIV ICD INSERTION CRT-D N/A  01/03/2018    [employer] Assura MP model [id number] (serial  Number [id number]) biventricular ICD for primary prevention of sudden death  . BREAST BIOPSY     right breast  . BREAST RECONSTRUCTION WITH PLACEMENT OF TISSUE EXPANDER AND FLEX HD (ACELLULAR HYDRATED DERMIS) Right 01/28/2014   Procedure: RIGHT BREAST RECONSTRUCTION WITH PLACEMENT OF TISSUE EXPANDER;  Surgeon: Crissie Reese, MD;  Location: [place] [facility] [place];  Service: Plastics;  Laterality: Right;  . CARDIAC CATHETERIZATION     x3; 03/18/12 [facility]): 10-20% pLAD, <20% ostial LCX, 20% mPLA, EF 60%.    .  CHOLECYSTECTOMY    . COLONOSCOPY     x4  . DILATION AND CURETTAGE OF UTERUS    . MASTECTOMY Right    malignant  . REDUCTION MAMMAPLASTY Left   . RHINOPLASTY    . SIMPLE MASTECTOMY WITH AXILLARY SENTINEL NODE BIOPSY Right 01/28/2014   Procedure: RIGHT TOTAL MASTECTOMY WITH  RIGHT AXILLARY SENTINEL NODE BIOPSY;  Surgeon: Excell Seltzer, MD;  Location: [place];  Service: General;  Laterality: Right;  . TONSILLECTOMY AND ADENOIDECTOMY    . TUBAL LIGATION      Social History   Socioeconomic History  . Marital status: Married    Spouse name: Not on file  . Number of children: Not on file  . Years of education: Not on file  . Highest education level: Not on file  Occupational History  . Not on file  Social Needs  . Financial resource strain: Not on file  . Food insecurity:    Worry: Not on file    Inability: Not on file  . Transportation needs:    Medical: Not on file    Non-medical: Not on file  Tobacco Use  . Smoking status: Never Smoker  . Smokeless tobacco: Never Used  Substance and Sexual Activity  . Alcohol use: No  . Drug use: No  . Sexual activity: Yes  Lifestyle  . Physical activity:    Days per week: Not on file    Minutes per session: Not on file  . Stress: Not on file  Relationships  . Social connections:    Talks on phone: Not on file    Gets together: Not on file    Attends religious service: Not on file    Active member of club or organization: Not on file    Attends meetings of clubs or organizations: Not on file    Relationship status: Not on file  . Intimate partner violence:    Fear of current or ex partner: Not on file    Emotionally abused: Not on file    Physically abused: Not on file    Forced sexual activity: Not on file  Other Topics Concern  . Not on file  Social History Narrative   Lives in [place]:   05/27/18 1045  BP: 120/79  Pulse: 80  SpO2: 97%  Weight: 181 lb (82.1 kg)  Height: 5\' 7"  (1.702 m)    Wt  Readings from Last 3 Encounters:  05/27/18 181 lb (82.1 kg)  05/12/18 176 lb (79.8 kg)  04/07/18 187 lb (84.8 kg)     PHYSICAL EXAM General: NAD HEENT: Normal. Neck: No JVD, no thyromegaly. Lungs: Clear to auscultation bilaterally with normal respiratory effort. CV: Regular rate and rhythm, normal S1/S2, no S3/S4, no murmur. No pretibial or periankle edema.  No carotid bruit.   Abdomen: Soft, nontender, no distention.  Neurologic: Alert and  oriented.  Psych: Normal affect. Skin: Normal. Musculoskeletal: No gross deformities.    ECG: Reviewed above under Subjective   Labs: Lab Results  Component Value Date/Time   K 4.0 01/04/2018 04:24 AM   K 4.2 06/21/2014 10:35 AM   BUN 10 01/04/2018 04:24 AM   BUN 12 12/30/2017 09:57 AM   BUN 9.7 06/21/2014 10:35 AM   CREATININE 0.69 01/04/2018 04:24 AM   CREATININE 0.8 06/21/2014 10:35 AM   ALT <6 06/21/2014 10:35 AM   TSH 2.125 06/21/2014 10:35 AM   HGB 12.3 12/30/2017 09:57 AM   HGB 12.0 06/21/2014 10:35 AM     Lipids: Lab Results  Component Value Date/Time   LDLCALC 77 06/21/2014 10:35 AM   CHOL 153 06/21/2014 10:35 AM   TRIG 150 (H) 06/21/2014 10:35 AM   HDL 46 06/21/2014 10:35 AM       ASSESSMENT AND PLAN: 1.  Chronic systolic heart failure: LVEF remains at 20% by echocardiogram on 05/21/2018.  She developed a rash with Entresto.  She remains on carvedilol, Lasix 40 mg daily along with supplemental potassium, and losartan 50 mg daily.  She appears euvolemic. No changes to therapy.  2.  Biventricular ICD: Implantation in September 2019.  LVEF remains severely reduced at 20% by echocardiogram on 05/21/2018.  Denies ICD shocks.  3.  Hypertension: Blood pressure is normal.  No changes to therapy.    Disposition: Follow up 6 months   Kate Sable, M.D., F.A.C.C.

## 2018-05-28 ENCOUNTER — Ambulatory Visit: Payer: BLUE CROSS/BLUE SHIELD

## 2018-05-28 DIAGNOSIS — M6281 Muscle weakness (generalized): Secondary | ICD-10-CM

## 2018-05-28 DIAGNOSIS — R293 Abnormal posture: Secondary | ICD-10-CM

## 2018-05-28 DIAGNOSIS — M25612 Stiffness of left shoulder, not elsewhere classified: Secondary | ICD-10-CM

## 2018-05-28 DIAGNOSIS — M25611 Stiffness of right shoulder, not elsewhere classified: Secondary | ICD-10-CM | POA: Diagnosis not present

## 2018-05-28 DIAGNOSIS — M79644 Pain in right finger(s): Secondary | ICD-10-CM

## 2018-05-28 NOTE — Therapy (Signed)
[place], [place], [address] Phone: [phone]   Fax:  [phone]  Physical Therapy Treatment  Patient Details  Name: Loretta Thomas MRN: [id number] Date of Birth: 09-Jun-1950 Referring Provider (PT): Lindi Adie   Encounter Date: 05/28/2018  PT End of Session - 05/28/18 1150    Visit Number  4    Number of Visits  9    Date for PT Re-Evaluation  06/12/18    PT Start Time  1105    PT Stop Time  1146    PT Time Calculation (min)  41 min    Activity Tolerance  Patient tolerated treatment well    Behavior During Therapy  [facility] for tasks assessed/performed       Past Medical History:  Diagnosis Date  . Anxiety   . Bladder cystocele   . Breast cancer ([place])    s/p L breast cancer removal  . Bundle branch block, left    diagnosed 03/2012 followed by cath showing only minimal CAD  . Bursitis   . Cancer ([place])    right breast  . Capsulitis   . Diverticulosis   . Elevated liver enzymes 2007  . History of IBS   . Hyperlipidemia   . Hypertension   . Nonischemic cardiomyopathy ([place])   . pernicious anemia     Past Surgical History:  Procedure Laterality Date  . ANKLE FRACTURE SURGERY    . BIV ICD INSERTION CRT-D N/A 01/03/2018    [facility] Assura MP model [id number] (serial  Number [id number]) biventricular ICD for primary prevention of sudden death  . BREAST BIOPSY     right breast  . BREAST RECONSTRUCTION WITH PLACEMENT OF TISSUE EXPANDER AND FLEX HD (ACELLULAR HYDRATED DERMIS) Right 01/28/2014   Procedure: RIGHT BREAST RECONSTRUCTION WITH PLACEMENT OF TISSUE EXPANDER;  Surgeon: Crissie Reese, MD;  Location: [place];  Service: Plastics;  Laterality: Right;  . CARDIAC CATHETERIZATION     x3; 03/18/12 [facility] [place]): 10-20% pLAD, <20% ostial LCX, 20% mPLA, EF 60%.    . CHOLECYSTECTOMY    . COLONOSCOPY     x4  . DILATION AND CURETTAGE OF UTERUS    . MASTECTOMY Right     malignant  . REDUCTION MAMMAPLASTY Left   . RHINOPLASTY    . SIMPLE MASTECTOMY WITH AXILLARY SENTINEL NODE BIOPSY Right 01/28/2014   Procedure: RIGHT TOTAL MASTECTOMY WITH  RIGHT AXILLARY SENTINEL NODE BIOPSY;  Surgeon: Excell Seltzer, MD;  Location: [place];  Service: General;  Laterality: Right;  . TONSILLECTOMY AND ADENOIDECTOMY    . TUBAL LIGATION      There were no vitals filed for this visit.  Subjective Assessment - 05/28/18 1109    Subjective  I felt really good after last visit but I didn't get to do my exercises much bc I started having chest pain and was collecting fluid from my CHF bc I finally realized that I thought I was taking my fluid pills and for a week I wasn't! So now that I'm back on them I'm starting to feel better. My bil shoulders feel real tight after having had that fluid on me for the past week.     Pertinent History  R breast cancer DCIS, ER/PR positive, HER 2-, R mastectomy 11/12/2013, CHF with pacemaker placement, alpha-gal (disease from tick bite)    Patient Stated Goals  to ease pain and increase movement, increase strength in upper body    Currently in Pain?  No/denies                       [facility] Adult PT Treatment/Exercise - 05/28/18 0001      Shoulder Exercises: Standing   External Rotation  Strengthening;Right;10 reps;Theraband    Theraband Level (Shoulder External Rotation)  Level 1 (Yellow)    External Rotation Limitations  Pt required mult tactile and VCs and demo for correct UE position and technique    Internal Rotation  Strengthening;Right;10 reps    Theraband Level (Shoulder Internal Rotation)  Level 1 (Yellow)    Flexion  Strengthening;Right;Left;10 reps;Theraband    Theraband Level (Shoulder Flexion)  Level 1 (Yellow)    Extension  Strengthening;Right;10 reps;Theraband    Theraband Level (Shoulder Extension)  Level 1 (Yellow)    Extension Limitations  VCs to increase Rt scapular retraction at end of motion and for correct  direction of motion (was trying to pull band into abd)      Manual Therapy   Manual Therapy  Myofascial release;Passive ROM    Soft tissue mobilization  --    Myofascial Release  Cross hands technique to bil chest walls horizontally to decr c/o tightness here    Passive ROM  to bil shoulders in supine into flexion, abduction and D2 with scapular depression throughout.                  PT Long Term Goals - 05/15/18 1523      PT LONG TERM GOAL #1   Title  Pt will demonstrate 150 degrees of bilateral shoulder abduction to allow her to reach out to the side    Baseline  R 131, L 149    Time  4    Period  Weeks    Status  New    Target Date  06/12/18      PT LONG TERM GOAL #2   Title  Pt will demonstrate improved scapular strength as evidenced by decreased winging of scapula    Baseline  bilateral winging with right worse than left    Time  4    Period  Weeks    Status  New    Target Date  06/12/18      PT LONG TERM GOAL #3   Title  Pt will demonstrate 160 degrees of bilateral shoulder flexion to allow her to reach overhead    Baseline  R 147 L 157    Time  4    Period  Weeks    Status  New    Target Date  06/12/18      PT LONG TERM GOAL #4   Title  Pt will be independent in a home exercise program for continued stretching and strengthening    Time  4    Period  Weeks    Status  New    Target Date  06/12/18            Plan - 05/28/18 1150    Clinical Impression Statement  Continued with manual therapy focusing on end P/ROM. Pt reports feeling very tight all week from accidentally not taking her fluid pill so pressure was increased at chest. She has seen her doctor to make sure she is okay which she is, just needs to be back on her pill which she has been for past few days. Also reviewed [place] as pt reports not doing her HEP due to the chest pain she was having and at the time, not knowing  what was cauing it.     Rehab Potential  Good    Clinical  Impairments Affecting Rehab Potential  long standing hx of shoulder problems    PT Frequency  2x / week    PT Duration  4 weeks    PT Treatment/Interventions  ADLs/Self Care Home Management;Therapeutic activities;Therapeutic exercise;Manual techniques;Patient/family education;Passive range of motion;Taping;Joint Manipulations    PT Next Visit Plan  Remeasure A/ROM for goal assess; may need furhter review of [place] and then focus on manual therapy to bil shoulders/upper quadrants.    Consulted and Agree with Plan of Care  Patient       Patient will benefit from skilled therapeutic intervention in order to improve the following deficits and impairments:  Pain, Postural dysfunction, Decreased range of motion, Decreased strength, Impaired UE functional use  Visit Diagnosis: Stiffness of right shoulder, not elsewhere classified  Stiffness of left shoulder, not elsewhere classified  Muscle weakness (generalized)  Pain in right finger(s)  Abnormal posture     Problem List Patient Active Problem List   Diagnosis Date Noted  . Hypertension 01/04/2018  . Hyperlipidemia 01/04/2018  . ICD (implantable cardioverter-defibrillator), biventricular, in situ 01/04/2018  . Chronic systolic dysfunction of left ventricle 01/03/2018  . Pernicious anemia 01/12/2016  . Depression, major, in remission ([place]) 07/07/2015  . Genetic testing 12/23/2014  . Family history of malignant neoplasm of breast 12/16/2013  . Family history of malignant neoplasm of gastrointestinal tract 12/16/2013  . Family history of malignant neoplasm of ovary 12/16/2013  . Breast cancer of upper-outer quadrant of right female breast ([place]) 11/12/2013    Otelia Limes, PTA 05/28/2018, 12:06 PM  [place], [place], [address] Phone: [phone]   Fax:  [phone]  Name: Cadey Bazile MRN: [id number] Date of Birth: 1951/03/14

## 2018-06-03 ENCOUNTER — Ambulatory Visit: Payer: BLUE CROSS/BLUE SHIELD

## 2018-06-03 DIAGNOSIS — R293 Abnormal posture: Secondary | ICD-10-CM

## 2018-06-03 DIAGNOSIS — M6281 Muscle weakness (generalized): Secondary | ICD-10-CM

## 2018-06-03 DIAGNOSIS — M25612 Stiffness of left shoulder, not elsewhere classified: Secondary | ICD-10-CM

## 2018-06-03 DIAGNOSIS — M79644 Pain in right finger(s): Secondary | ICD-10-CM

## 2018-06-03 DIAGNOSIS — M25611 Stiffness of right shoulder, not elsewhere classified: Secondary | ICD-10-CM

## 2018-06-03 NOTE — Therapy (Signed)
[place], [place], [address] Phone: [phone]   Fax:  [phone]  Physical Therapy Treatment  Patient Details  Name: Loretta Thomas MRN: [id number] Date of Birth: 12-Oct-1950 Referring Provider (PT): Lindi Adie   Encounter Date: 06/03/2018  PT End of Session - 06/03/18 8921    Visit Number  5    Number of Visits  9    Date for PT Re-Evaluation  06/12/18    PT Start Time  1104    PT Stop Time  1145    PT Time Calculation (min)  41 min    Activity Tolerance  Patient tolerated treatment well    Behavior During Therapy  [facility] for tasks assessed/performed       Past Medical History:  Diagnosis Date  . Anxiety   . Bladder cystocele   . Breast cancer ([place])    s/p L breast cancer removal  . Bundle branch block, left    diagnosed 03/2012 followed by cath showing only minimal CAD  . Bursitis   . Cancer ([place])    right breast  . Capsulitis   . Diverticulosis   . Elevated liver enzymes 2007  . History of IBS   . Hyperlipidemia   . Hypertension   . Nonischemic cardiomyopathy ([place])   . pernicious anemia     Past Surgical History:  Procedure Laterality Date  . ANKLE FRACTURE SURGERY    . BIV ICD INSERTION CRT-D N/A 01/03/2018    [facility] [employer] MP model [id number] (serial  Number [id number]) biventricular ICD for primary prevention of sudden death  . BREAST BIOPSY     right breast  . BREAST RECONSTRUCTION WITH PLACEMENT OF TISSUE EXPANDER AND FLEX HD (ACELLULAR HYDRATED DERMIS) Right 01/28/2014   Procedure: RIGHT BREAST RECONSTRUCTION WITH PLACEMENT OF TISSUE EXPANDER;  Surgeon: Crissie Reese, MD;  Location: [place];  Service: Plastics;  Laterality: Right;  . CARDIAC CATHETERIZATION     x3; 03/18/12 [facility] [place]): 10-20% pLAD, <20% ostial LCX, 20% mPLA, EF 60%.    . CHOLECYSTECTOMY    . COLONOSCOPY     x4  . DILATION AND CURETTAGE OF UTERUS    . MASTECTOMY Right    malignant  . REDUCTION MAMMAPLASTY Left   . RHINOPLASTY    . SIMPLE MASTECTOMY WITH AXILLARY SENTINEL NODE BIOPSY Right 01/28/2014   Procedure: RIGHT TOTAL MASTECTOMY WITH  RIGHT AXILLARY SENTINEL NODE BIOPSY;  Surgeon: Excell Seltzer, MD;  Location: [facility];  Service: General;  Laterality: Right;  . TONSILLECTOMY AND ADENOIDECTOMY    . TUBAL LIGATION      There were no vitals filed for this visit.  Subjective Assessment - 06/03/18 1106    Subjective  I'm a little SOB today bc I walked my 10,000 steps yesterday in the cold weather. I've been doing the theraband exercises you gave me and I can tell they are really helping. The tightness in my shoulders did good for awhile and it was just the past few days I felt it tightening back up. I've had to drive back and forth from [place] with my sister for her doctor appts though and I know that's part of what made me tighten back up.     Pertinent History  R breast cancer DCIS, ER/PR positive, HER 2-, R mastectomy 11/12/2013, CHF with pacemaker placement, alpha-gal (disease from tick bite)    Patient Stated Goals  to ease pain and increase movement, increase strength in upper body  Currently in Pain?  No/denies         [facility] PT Assessment - 06/03/18 0001      AROM   Right Shoulder Flexion  150 Degrees    Right Shoulder ABduction  150 Degrees    Right Shoulder Internal Rotation  44 Degrees    Left Shoulder Flexion  154 Degrees    Left Shoulder ABduction  156 Degrees                   [facility] Adult PT Treatment/Exercise - 06/03/18 0001      Shoulder Exercises: Therapy Ball   Flexion  Both;10 reps   forward lean into end of stretch   ABduction  Right;Left;5 reps   same side lean into end of stretch   ABduction Limitations  pt returned therapist demo      Manual Therapy   Myofascial Release  Cross hands technique to bil chest walls horizontally to decr c/o tightness here while supine over towel roll along T-spine    Passive  ROM  to bil shoulders in supine over towel roll along T-spine into flexion, abduction and D2 with scapular depression throughout.                  PT Long Term Goals - 05/15/18 1523      PT LONG TERM GOAL #1   Title  Pt will demonstrate 150 degrees of bilateral shoulder abduction to allow her to reach out to the side    Baseline  R 131, L 149    Time  4    Period  Weeks    Status  New    Target Date  06/12/18      PT LONG TERM GOAL #2   Title  Pt will demonstrate improved scapular strength as evidenced by decreased winging of scapula    Baseline  bilateral winging with right worse than left    Time  4    Period  Weeks    Status  New    Target Date  06/12/18      PT LONG TERM GOAL #3   Title  Pt will demonstrate 160 degrees of bilateral shoulder flexion to allow her to reach overhead    Baseline  R 147 L 157    Time  4    Period  Weeks    Status  New    Target Date  06/12/18      PT LONG TERM GOAL #4   Title  Pt will be independent in a home exercise program for continued stretching and strengthening    Time  4    Period  Weeks    Status  New    Target Date  06/12/18            Plan - 06/03/18 1151    Clinical Impression Statement  Began session with AA/ROM stretching which pt tolerated very well reporting feeling good stretches during. Remesaured her A/ROM which has improved some. Seems to be min with some as pt dealt with alot of chest tightness last week when she was taking wrong medication so didn't do HEP consistently as she wasn't sure what was causing the problem at the time. So she feels her ROM has begun to improve again this week as she has begun to be able to be compliant with HEP again.     Rehab Potential  Good    Clinical Impairments Affecting Rehab Potential  long standing hx of shoulder  problems    PT Frequency  2x / week    PT Duration  4 weeks    PT Treatment/Interventions  ADLs/Self Care Home Management;Therapeutic activities;Therapeutic  exercise;Manual techniques;Patient/family education;Passive range of motion;Taping;Joint Manipulations    PT Next Visit Plan  Cont AA/ROM stretching progressing HEP with this prn and focus on manual therapy to bil shoulders/upper quadrants.    Consulted and Agree with Plan of Care  Patient       Patient will benefit from skilled therapeutic intervention in order to improve the following deficits and impairments:  Pain, Postural dysfunction, Decreased range of motion, Decreased strength, Impaired UE functional use  Visit Diagnosis: Stiffness of right shoulder, not elsewhere classified  Stiffness of left shoulder, not elsewhere classified  Muscle weakness (generalized)  Pain in right finger(s)  Abnormal posture     Problem List Patient Active Problem List   Diagnosis Date Noted  . Hypertension 01/04/2018  . Hyperlipidemia 01/04/2018  . ICD (implantable cardioverter-defibrillator), biventricular, in situ 01/04/2018  . Chronic systolic dysfunction of left ventricle 01/03/2018  . Pernicious anemia 01/12/2016  . Depression, major, in remission ([place]) 07/07/2015  . Genetic testing 12/23/2014  . Family history of malignant neoplasm of breast 12/16/2013  . Family history of malignant neoplasm of gastrointestinal tract 12/16/2013  . Family history of malignant neoplasm of ovary 12/16/2013  . Breast cancer of upper-outer quadrant of right female breast ([place]) 11/12/2013    Otelia Limes, PTA 06/03/2018, 11:55 AM  [place], [place], [address] Phone: [phone]   Fax:  [phone]  Name: Loretta Thomas MRN: [id number] Date of Birth: 1951-02-09

## 2018-06-04 LAB — CUP PACEART INCLINIC DEVICE CHECK
Battery Remaining Longevity: 85 mo
Brady Statistic RA Percent Paced: 3.2 %
Brady Statistic RV Percent Paced: 99.87 %
Date Time Interrogation Session: 20191223200149
HIGH POWER IMPEDANCE MEASURED VALUE: 69.75 Ohm
Implantable Lead Implant Date: 20190920
Implantable Lead Implant Date: 20190920
Implantable Lead Location: 753858
Implantable Lead Location: 753859
Implantable Lead Location: 753860
Implantable Pulse Generator Implant Date: 20190920
Lead Channel Impedance Value: 512.5 Ohm
Lead Channel Impedance Value: 937.5 Ohm
Lead Channel Pacing Threshold Amplitude: 0.5 V
Lead Channel Pacing Threshold Amplitude: 0.5 V
Lead Channel Pacing Threshold Amplitude: 0.875 V
Lead Channel Pacing Threshold Pulse Width: 0.5 ms
Lead Channel Pacing Threshold Pulse Width: 0.5 ms
Lead Channel Pacing Threshold Pulse Width: 0.5 ms
Lead Channel Sensing Intrinsic Amplitude: 11.8 mV
Lead Channel Sensing Intrinsic Amplitude: 4.3 mV
Lead Channel Setting Pacing Amplitude: 2 V
Lead Channel Setting Pacing Amplitude: 2 V
Lead Channel Setting Pacing Pulse Width: 0.5 ms
Lead Channel Setting Pacing Pulse Width: 0.5 ms
Lead Channel Setting Sensing Sensitivity: 0.5 mV
MDC IDC LEAD IMPLANT DT: 20190920
MDC IDC MSMT LEADCHNL RA IMPEDANCE VALUE: 475 Ohm
MDC IDC SET LEADCHNL LV PACING AMPLITUDE: 2 V
Pulse Gen Serial Number: 9833194

## 2018-06-05 ENCOUNTER — Encounter: Payer: BLUE CROSS/BLUE SHIELD | Admitting: Physical Therapy

## 2018-06-09 ENCOUNTER — Ambulatory Visit: Payer: BLUE CROSS/BLUE SHIELD

## 2018-06-09 DIAGNOSIS — M25611 Stiffness of right shoulder, not elsewhere classified: Secondary | ICD-10-CM

## 2018-06-09 DIAGNOSIS — M25612 Stiffness of left shoulder, not elsewhere classified: Secondary | ICD-10-CM

## 2018-06-09 DIAGNOSIS — M6281 Muscle weakness (generalized): Secondary | ICD-10-CM

## 2018-06-09 DIAGNOSIS — R293 Abnormal posture: Secondary | ICD-10-CM

## 2018-06-09 DIAGNOSIS — M79644 Pain in right finger(s): Secondary | ICD-10-CM

## 2018-06-09 NOTE — Therapy (Signed)
[place], [place], [address] Phone: [phone]   Fax:  [phone]  Physical Therapy Treatment  Patient Details  Name: Loretta Thomas MRN: [id number] Date of Birth: 1950-09-04 Referring Provider (PT): Lindi Adie   Encounter Date: 06/09/2018  PT End of Session - 06/09/18 1147    Visit Number  6    Number of Visits  9    Date for PT Re-Evaluation  06/12/18    Authorization Type  no auth    PT Start Time  1104    PT Stop Time  1147    PT Time Calculation (min)  43 min    Activity Tolerance  Patient tolerated treatment well    Behavior During Therapy  [facility] for tasks assessed/performed       Past Medical History:  Diagnosis Date  . Anxiety   . Bladder cystocele   . Breast cancer ([place])    s/p L breast cancer removal  . Bundle branch block, left    diagnosed 03/2012 followed by cath showing only minimal CAD  . Bursitis   . Cancer ([place])    right breast  . Capsulitis   . Diverticulosis   . Elevated liver enzymes 2007  . History of IBS   . Hyperlipidemia   . Hypertension   . Nonischemic cardiomyopathy ([place])   . pernicious anemia     Past Surgical History:  Procedure Laterality Date  . ANKLE FRACTURE SURGERY    . BIV ICD INSERTION CRT-D N/A 01/03/2018    [facility] [employer] MP model [id number] (serial  Number [id number]) biventricular ICD for primary prevention of sudden death  . BREAST BIOPSY     right breast  . BREAST RECONSTRUCTION WITH PLACEMENT OF TISSUE EXPANDER AND FLEX HD (ACELLULAR HYDRATED DERMIS) Right 01/28/2014   Procedure: RIGHT BREAST RECONSTRUCTION WITH PLACEMENT OF TISSUE EXPANDER;  Surgeon: Crissie Reese, MD;  Location: [place];  Service: Plastics;  Laterality: Right;  . CARDIAC CATHETERIZATION     x3; 03/18/12 [facility] [place]): 10-20% pLAD, <20% ostial LCX, 20% mPLA, EF 60%.    . CHOLECYSTECTOMY    . COLONOSCOPY     x4  . DILATION AND CURETTAGE OF UTERUS     . MASTECTOMY Right    malignant  . REDUCTION MAMMAPLASTY Left   . RHINOPLASTY    . SIMPLE MASTECTOMY WITH AXILLARY SENTINEL NODE BIOPSY Right 01/28/2014   Procedure: RIGHT TOTAL MASTECTOMY WITH  RIGHT AXILLARY SENTINEL NODE BIOPSY;  Surgeon: Excell Seltzer, MD;  Location: [place];  Service: General;  Laterality: Right;  . TONSILLECTOMY AND ADENOIDECTOMY    . TUBAL LIGATION      There were no vitals filed for this visit.  Subjective Assessment - 06/09/18 1108    Subjective  My back is doing better as my shoulder loosens up. I can also tell my Lt shoulder is getting alot better as we  continue working on it. You stretched me good last time. My finger has only bothered me about 2x in the past week so I know it was connected to my shoulder.     Pertinent History  R breast cancer DCIS, ER/PR positive, HER 2-, R mastectomy 11/12/2013, CHF with pacemaker placement, alpha-gal (disease from tick bite)    Patient Stated Goals  to ease pain and increase movement, increase strength in upper body    Currently in Pain?  No/denies  [facility] Adult PT Treatment/Exercise - 06/09/18 0001      Shoulder Exercises: Pulleys   Flexion  2 minutes    ABduction  2 minutes      Shoulder Exercises: Therapy Ball   Flexion  Both;10 reps   Forward lean into end of stretch; 1 lb on wrist   ABduction  Right;Left;10 reps   1 lb each wrist; same side lean into end of stretch   ABduction Limitations  pt SOB after today      Shoulder Exercises: ROM/Strengthening   Other ROM/Strengthening Exercises  Modified downward dog on wall 4x, 5 sec and pt again SOB after so seated rest.       Manual Therapy   Myofascial Release  Cross hands technique to bil chest walls horizontally to decr c/o tightness here while supine over towel roll along T-spine    Passive ROM  to bil shoulders in supine over towel roll along T-spine into flexion, abduction and D2 with scapular depression throughout.                   PT Long Term Goals - 05/15/18 1523      PT LONG TERM GOAL #1   Title  Pt will demonstrate 150 degrees of bilateral shoulder abduction to allow her to reach out to the side    Baseline  R 131, L 149    Time  4    Period  Weeks    Status  New    Target Date  06/12/18      PT LONG TERM GOAL #2   Title  Pt will demonstrate improved scapular strength as evidenced by decreased winging of scapula    Baseline  bilateral winging with right worse than left    Time  4    Period  Weeks    Status  New    Target Date  06/12/18      PT LONG TERM GOAL #3   Title  Pt will demonstrate 160 degrees of bilateral shoulder flexion to allow her to reach overhead    Baseline  R 147 L 157    Time  4    Period  Weeks    Status  New    Target Date  06/12/18      PT LONG TERM GOAL #4   Title  Pt will be independent in a home exercise program for continued stretching and strengthening    Time  4    Period  Weeks    Status  New    Target Date  06/12/18            Plan - 06/09/18 1148    Clinical Impression Statement  Continued with stretching and working towards increasing pts end ROM with AA/ROM and P/ROM. Pt reports noticing good overall improvement with her ROM and thoracic pain. She feels ready for D/C at next session.  Also discussed with pt compression bras as she reports area of fullness at Rt lateral trunk still bothers her so sent order to Dr. Lindi Adie today for this.     Rehab Potential  Good    Clinical Impairments Affecting Rehab Potential  long standing hx of shoulder problems    PT Frequency  2x / week    PT Duration  4 weeks    PT Treatment/Interventions  ADLs/Self Care Home Management;Therapeutic activities;Therapeutic exercise;Manual techniques;Patient/family education;Passive range of motion;Taping;Joint Manipulations    PT Next Visit Plan  Probable D/C next visit. Review goals, remeasure  ROM and add 3 way raises to HEP. Script back from doctor? If not  can fax to [facility] [place] later.     Recommended Other Services  Sent order to Dr. Lindi Adie for compression bra    Consulted and Agree with Plan of Care  Patient       Patient will benefit from skilled therapeutic intervention in order to improve the following deficits and impairments:  Pain, Postural dysfunction, Decreased range of motion, Decreased strength, Impaired UE functional use  Visit Diagnosis: Stiffness of right shoulder, not elsewhere classified  Stiffness of left shoulder, not elsewhere classified  Muscle weakness (generalized)  Pain in right finger(s)  Abnormal posture     Problem List Patient Active Problem List   Diagnosis Date Noted  . Hypertension 01/04/2018  . Hyperlipidemia 01/04/2018  . ICD (implantable cardioverter-defibrillator), biventricular, in situ 01/04/2018  . Chronic systolic dysfunction of left ventricle 01/03/2018  . Pernicious anemia 01/12/2016  . Depression, major, in remission ([place]) 07/07/2015  . Genetic testing 12/23/2014  . Family history of malignant neoplasm of breast 12/16/2013  . Family history of malignant neoplasm of gastrointestinal tract 12/16/2013  . Family history of malignant neoplasm of ovary 12/16/2013  . Breast cancer of upper-outer quadrant of right female breast ([place]) 11/12/2013    Otelia Limes, PTA 06/09/2018, 12:02 PM  [place], [place], [address] Phone: [phone]   Fax:  [phone]  Name: Savannah Morford MRN: [id number] Date of Birth: Nov 23, 1950

## 2018-06-11 ENCOUNTER — Ambulatory Visit: Payer: BLUE CROSS/BLUE SHIELD

## 2018-06-11 DIAGNOSIS — M25611 Stiffness of right shoulder, not elsewhere classified: Secondary | ICD-10-CM

## 2018-06-11 DIAGNOSIS — M25612 Stiffness of left shoulder, not elsewhere classified: Secondary | ICD-10-CM

## 2018-06-11 DIAGNOSIS — R293 Abnormal posture: Secondary | ICD-10-CM

## 2018-06-11 DIAGNOSIS — M6281 Muscle weakness (generalized): Secondary | ICD-10-CM

## 2018-06-11 DIAGNOSIS — M79644 Pain in right finger(s): Secondary | ICD-10-CM

## 2018-06-11 NOTE — Patient Instructions (Signed)
Access Code: [email or web address]  URL: [email or web address]  Date: 06/11/2018  Prepared by: Collie Siad   Exercises  Forearm Plank on Wall - 10 reps - 2 sets - 1x daily - 5x weekly  With forearm walking and horizontal abduction with red theraband

## 2018-06-11 NOTE — Therapy (Addendum)
[place], [place], [address] Phone: [phone]   Fax:  [phone]  Physical Therapy Treatment  Patient Details  Name: Ronalda Walpole MRN: [id number] Date of Birth: May 15, 1950 Referring Provider (PT): Lindi Adie   Encounter Date: 06/11/2018  PT End of Session - 06/11/18 1207    Visit Number  7    Number of Visits  9    Date for PT Re-Evaluation  06/12/18    PT Start Time  1110    PT Stop Time  1159    PT Time Calculation (min)  49 min    Activity Tolerance  Patient tolerated treatment well    Behavior During Therapy  [facility] for tasks assessed/performed       Past Medical History:  Diagnosis Date  . Anxiety   . Bladder cystocele   . Breast cancer ([place])    s/p L breast cancer removal  . Bundle branch block, left    diagnosed 03/2012 followed by cath showing only minimal CAD  . Bursitis   . Cancer ([place])    right breast  . Capsulitis   . Diverticulosis   . Elevated liver enzymes 2007  . History of IBS   . Hyperlipidemia   . Hypertension   . Nonischemic cardiomyopathy ([place])   . pernicious anemia     Past Surgical History:  Procedure Laterality Date  . ANKLE FRACTURE SURGERY    . BIV ICD INSERTION CRT-D N/A 01/03/2018    [facility] Assura MP model [phone] (serial  Number [id number]) biventricular ICD for primary prevention of sudden death  . BREAST BIOPSY     right breast  . BREAST RECONSTRUCTION WITH PLACEMENT OF TISSUE EXPANDER AND FLEX HD (ACELLULAR HYDRATED DERMIS) Right 01/28/2014   Procedure: RIGHT BREAST RECONSTRUCTION WITH PLACEMENT OF TISSUE EXPANDER;  Surgeon: Crissie Reese, MD;  Location: [place];  Service: Plastics;  Laterality: Right;  . CARDIAC CATHETERIZATION     x3; 03/18/12 [facility] [place]): 10-20% pLAD, <20% ostial LCX, 20% mPLA, EF 60%.    . CHOLECYSTECTOMY    . COLONOSCOPY     x4  . DILATION AND CURETTAGE OF UTERUS    . MASTECTOMY Right     malignant  . REDUCTION MAMMAPLASTY Left   . RHINOPLASTY    . SIMPLE MASTECTOMY WITH AXILLARY SENTINEL NODE BIOPSY Right 01/28/2014   Procedure: RIGHT TOTAL MASTECTOMY WITH  RIGHT AXILLARY SENTINEL NODE BIOPSY;  Surgeon: Excell Seltzer, MD;  Location: [place];  Service: General;  Laterality: Right;  . TONSILLECTOMY AND ADENOIDECTOMY    . TUBAL LIGATION      There were no vitals filed for this visit.  Subjective Assessment - 06/11/18 1119    Subjective  My tightness is so much improved since coming here. I'm ready to make today my last day.     Pertinent History  R breast cancer DCIS, ER/PR positive, HER 2-, R mastectomy 11/12/2013, CHF with pacemaker placement, alpha-gal (disease from tick bite)    Patient Stated Goals  to ease pain and increase movement, increase strength in upper body    Currently in Pain?  No/denies         [facility] PT Assessment - 06/11/18 0001      AROM   Right Shoulder Flexion  152 Degrees    Right Shoulder ABduction  150 Degrees   still with some winging and compensation   Right Shoulder Internal Rotation  60 Degrees    Right Shoulder  External Rotation  85 Degrees    Left Shoulder Flexion  152 Degrees    Left Shoulder ABduction  155 Degrees    Left Shoulder Internal Rotation  59 Degrees    Left Shoulder External Rotation  75 Degrees                   [facility] Adult PT Treatment/Exercise - 06/11/18 0001      Shoulder Exercises: Standing   Other Standing Exercises  Forearms on wall holding red theraband for following: walking on wall 2x up and 2x down, 3x total, then horizontal abduction with trunk rotation 3x each side. Pt returned therapist demonstration but these were very challenging for pt      Manual Therapy   Myofascial Release  Cross hands technique to bil chest walls horizontally to decr c/o tightness here while supine over towel roll along T-spine    Passive ROM  to bil shoulders in supine over towel roll along T-spine into flexion,  abduction and D2 with scapular depression throughout.             PT Education - 06/11/18 1155    Education Details  Scapular stability with forearms on wall with red theraband    Person(s) Educated  Patient    Methods  Explanation;Demonstration;Handout    Comprehension  Verbalized understanding;Returned demonstration;Need further instruction          PT Long Term Goals - 06/11/18 1112      PT LONG TERM GOAL #1   Title  Pt will demonstrate 150 degrees of bilateral shoulder abduction to allow her to reach out to the side    Baseline  R 131, L 149; Rt 150 and Lt 155-06/11/18    Status  Achieved      PT LONG TERM GOAL #2   Title  Pt will demonstrate improved scapular strength as evidenced by decreased winging of scapula    Baseline  bilateral winging with right worse than left; Rt side still slightly more prominent but pt reports not feeling scapula pushing into the bed in supine-06/11/18    Status  Partially Met      PT LONG TERM GOAL #3   Title  Pt will demonstrate 160 degrees of bilateral shoulder flexion to allow her to reach overhead    Baseline  R 147 L 157; Rt 152 and Lt 152 degrees (pt reports tightness above pacemaker limiting end ROM today on Lt)-06/11/18    Status  Not Met      PT LONG TERM GOAL #4   Title  Pt will be independent in a home exercise program for continued stretching and strengthening    Status  Achieved            Plan - 06/11/18 1208    Clinical Impression Statement  Pt overall has done well with therapy and made good progress. She still demonstrates some Rt side scapular winging, but pt reports this feels improved as she doesn't feel her scapula press into bed in supine now. Her abduction improved and goal met, but flexion was minimally improved not meeting that goal. Pt is pleased with her progres thus far and is indepedent with HEP and is ready for D/C at this time. Issued script for compression bra and info to call [facility] [employer] [facility] to get  measured.     Rehab Potential  Good    Clinical Impairments Affecting Rehab Potential  long standing hx of shoulder problems    PT Frequency  2x / week    PT Duration  4 weeks    PT Treatment/Interventions  ADLs/Self Care Home Management;Therapeutic activities;Therapeutic exercise;Manual techniques;Patient/family education;Passive range of motion;Taping;Joint Manipulations    PT Next Visit Plan  D/C this visit.    Consulted and Agree with Plan of Care  Patient       Patient will benefit from skilled therapeutic intervention in order to improve the following deficits and impairments:  Pain, Postural dysfunction, Decreased range of motion, Decreased strength, Impaired UE functional use  Visit Diagnosis: Stiffness of right shoulder, not elsewhere classified  Stiffness of left shoulder, not elsewhere classified  Muscle weakness (generalized)  Pain in right finger(s)  Abnormal posture     Problem List Patient Active Problem List   Diagnosis Date Noted  . Hypertension 01/04/2018  . Hyperlipidemia 01/04/2018  . ICD (implantable cardioverter-defibrillator), biventricular, in situ 01/04/2018  . Chronic systolic dysfunction of left ventricle 01/03/2018  . Pernicious anemia 01/12/2016  . Depression, major, in remission ([place]) 07/07/2015  . Genetic testing 12/23/2014  . Family history of malignant neoplasm of breast 12/16/2013  . Family history of malignant neoplasm of gastrointestinal tract 12/16/2013  . Family history of malignant neoplasm of ovary 12/16/2013  . Breast cancer of upper-outer quadrant of right female breast ([place]) 11/12/2013    Otelia Limes, PTA 06/11/2018, 12:58 PM  [place], [place], [address] Phone: [phone]   Fax:  [phone]  Name: TRUE Shackleford MRN: [id number] Date of Birth: 03/29/1951  PHYSICAL THERAPY DISCHARGE SUMMARY  Visits from Start of Care:  7  Current functional level related to goals / functional outcomes: See above   Remaining deficits: See above   Education / Equipment: HEP  Plan: Patient agrees to discharge.  Patient goals were partially met. Patient is being discharged due to meeting the stated rehab goals.  ?????    Allyson Sabal [place], [place] 06/12/18 5:06 PM

## 2018-06-13 ENCOUNTER — Other Ambulatory Visit: Payer: Self-pay | Admitting: *Deleted

## 2018-06-13 MED ORDER — CARVEDILOL 3.125 MG PO TABS: 3.1250 mg | ORAL_TABLET | Freq: Two times a day (BID) | ORAL | 6 refills | Status: DC

## 2018-06-20 ENCOUNTER — Telehealth: Payer: Self-pay | Admitting: Cardiovascular Disease

## 2018-06-20 NOTE — Telephone Encounter (Signed)
Patient called wanting to know if she can take the flu shot.

## 2018-06-20 NOTE — Telephone Encounter (Signed)
Spoke with Santiago Glad per Dr Bronson Ing if pt has been pre med before can take the normal amoxicillin 500 mg 4 tablets 1 hour prior to cleaning

## 2018-06-20 NOTE — Telephone Encounter (Signed)
Pt wanted to make sure Dr Bronson Ing recommended taking flu shot - advised that all of our providers recommend this - pt denies any negative reactions from previous flu shots in the past. Says pcp also recommended this and she would get immunization at local pharmacy

## 2018-06-20 NOTE — Telephone Encounter (Signed)
That is fine 

## 2018-06-20 NOTE — Telephone Encounter (Signed)
1. What dental office are you calling from? [facility]  What is your office phone number? [phone]   Ask for Santiago Glad )   2. What is your fax number?[phone]  3. What type of procedure is the patient having performed? Cleaning  4.   5. What date is procedure scheduled or is the patient there now? 06/19/2018  (if the patient is at the dentist's office question goes to their cardiologist if he/she is in the office.  If not, question should go to the DOD).   6. What is your question (ex. Antibiotics prior to procedure, holding medication-we need to know how long dentist wants pt to hold med)? Patient takes Amoxcillin 500 mg  (pre med) patient takes 4 pills 1 hour before procedure.

## 2018-06-23 ENCOUNTER — Telehealth: Payer: Self-pay

## 2018-06-23 ENCOUNTER — Ambulatory Visit (INDEPENDENT_AMBULATORY_CARE_PROVIDER_SITE_OTHER): Payer: BLUE CROSS/BLUE SHIELD

## 2018-06-23 DIAGNOSIS — I5022 Chronic systolic (congestive) heart failure: Secondary | ICD-10-CM | POA: Diagnosis not present

## 2018-06-23 DIAGNOSIS — Z95810 Presence of automatic (implantable) cardiac defibrillator: Secondary | ICD-10-CM

## 2018-06-23 NOTE — Progress Notes (Signed)
EPIC Encounter for ICM Monitoring  Patient Name: Loretta Thomas is a 68 y.o. female Date: 06/23/2018 Primary Care Physican: Rossie Muskrat, DO Primary Cardiologist:Koneswaran Electrophysiologist:Allred Bi-V Pacing:>99% Last Weight:173lbs  Today's Weight: unknown                                                    Attempted call to patient and unable to reach.  Left message to return call. Transmission reviewed.   Report: Thoracic impedance normal but was abnormal suggesting fluid from 06/12/2018 through 06/15/2018.  Prescribed: Furosemide40 mg take 1 tablet daily. Potassium 20 mEq 1 tablet daily  Labs: 01/04/2018 Creatinine0.69, BUN10, Potassium4.0, Sodium141, eGFR>60  12/30/2017 Creatinine0.81, BUN12, Potassium4.1, Sodium143, CXKG81-85 A complete set of results can be found in Results Review.  Recommendations: Unable to reach.  Follow-up plan: ICM clinic phone appointment on 07/28/2018.    Copy of ICM check sent to Dr.Allred.   3 month ICM trend: 06/23/2018    1 Year ICM trend:       [name], RN 06/23/2018 3:10 PM

## 2018-06-23 NOTE — Progress Notes (Signed)
Patient returned call.  She said she took extra fluid pill 2 days ago due to her hands swelling which has now resolved.  [place].  She is feeling okay now.  Advised to call if she has fluid symptoms.  Next remote transmission 07/28/2018.

## 2018-06-23 NOTE — Telephone Encounter (Signed)
Spoke with patient to remind of missed remote transmission 

## 2018-06-23 NOTE — Telephone Encounter (Signed)
Remote ICM transmission received.  Attempted call to patient regarding ICM remote transmission and left message, per DPR, to return call.    

## 2018-07-07 ENCOUNTER — Other Ambulatory Visit: Payer: Self-pay

## 2018-07-07 ENCOUNTER — Ambulatory Visit (INDEPENDENT_AMBULATORY_CARE_PROVIDER_SITE_OTHER): Payer: BLUE CROSS/BLUE SHIELD | Admitting: *Deleted

## 2018-07-07 DIAGNOSIS — I5022 Chronic systolic (congestive) heart failure: Secondary | ICD-10-CM | POA: Diagnosis not present

## 2018-07-07 DIAGNOSIS — I428 Other cardiomyopathies: Secondary | ICD-10-CM

## 2018-07-08 LAB — CUP PACEART REMOTE DEVICE CHECK
Battery Remaining Longevity: 84 mo
Battery Remaining Percentage: 91 %
Battery Voltage: 3.13 V
Brady Statistic AP VP Percent: 3.5 %
Brady Statistic AP VS Percent: 1 %
Brady Statistic AS VP Percent: 96 %
Brady Statistic AS VS Percent: 1 %
Brady Statistic RA Percent Paced: 3.5 %
Date Time Interrogation Session: 20200323060017
HighPow Impedance: 80 Ohm
HighPow Impedance: 80 Ohm
Implantable Lead Implant Date: 20190920
Implantable Lead Implant Date: 20190920
Implantable Lead Implant Date: 20190920
Implantable Lead Location: 753858
Implantable Lead Location: 753859
Implantable Lead Location: 753860
Implantable Pulse Generator Implant Date: 20190920
Lead Channel Impedance Value: 1100 Ohm
Lead Channel Impedance Value: 480 Ohm
Lead Channel Pacing Threshold Amplitude: 0.5 V
Lead Channel Pacing Threshold Amplitude: 0.5 V
Lead Channel Pacing Threshold Amplitude: 1 V
Lead Channel Pacing Threshold Pulse Width: 0.5 ms
Lead Channel Sensing Intrinsic Amplitude: 11.8 mV
Lead Channel Setting Pacing Amplitude: 2 V
Lead Channel Setting Pacing Amplitude: 2 V
Lead Channel Setting Pacing Amplitude: 2 V
Lead Channel Setting Pacing Pulse Width: 0.5 ms
Lead Channel Setting Pacing Pulse Width: 0.5 ms
Lead Channel Setting Sensing Sensitivity: 0.5 mV
MDC IDC MSMT LEADCHNL LV PACING THRESHOLD PULSEWIDTH: 0.5 ms
MDC IDC MSMT LEADCHNL RA SENSING INTR AMPL: 4.7 mV
MDC IDC MSMT LEADCHNL RV IMPEDANCE VALUE: 560 Ohm
MDC IDC MSMT LEADCHNL RV PACING THRESHOLD PULSEWIDTH: 0.5 ms
Pulse Gen Serial Number: 9833194

## 2018-07-16 NOTE — Progress Notes (Signed)
Remote pacemaker transmission.   

## 2018-07-21 NOTE — Telephone Encounter (Signed)
Returned patient's call as requested.  She said she felt lightheaded over the weekend.  She has not checked her BP at home.  Reviewed [facility] transmission 07/20/2018 and advised that report suggests dryness over the last 4-5 days.  Advised to increase fluid intake up to 64 oz daily to help with hydration.  She is feeling a lot stress due to COVID and worrying she will get it.   Advised of precautions to take and call PCP if she develops any symptoms. Advised to record daily weight and BP.   Encouraged to call back if she has any further problems.   07/20/2018 [facility] Direct Trend Report

## 2018-07-21 NOTE — Telephone Encounter (Signed)
Patient retuning call  Requesting to speak with Margarita Grizzle

## 2018-07-28 ENCOUNTER — Other Ambulatory Visit: Payer: Self-pay

## 2018-07-28 ENCOUNTER — Ambulatory Visit (INDEPENDENT_AMBULATORY_CARE_PROVIDER_SITE_OTHER): Payer: BLUE CROSS/BLUE SHIELD

## 2018-07-28 DIAGNOSIS — I5022 Chronic systolic (congestive) heart failure: Secondary | ICD-10-CM | POA: Diagnosis not present

## 2018-07-28 DIAGNOSIS — Z95810 Presence of automatic (implantable) cardiac defibrillator: Secondary | ICD-10-CM | POA: Diagnosis not present

## 2018-07-28 NOTE — Progress Notes (Signed)
EPIC Encounter for ICM Monitoring  Patient Name: Loretta Thomas is a 68 y.o. female Date: 07/28/2018 Primary Care Physican: Rossie Muskrat, DO Primary Cardiologist:Koneswaran Electrophysiologist:Allred Bi-V Pacing:>99% LastWeight:173lbs 07/28/2018 Weight: does not weigh  Patient reports the lightheadedness she had last week has resolved but she does feel tired. She does feel her chest does feel congested.  She does not think she has been drinking too much fluids but unsure what would be causing her fluid accumulation.  She does not usually review food labels but does not think she is getting too much salt.    Thoracic impedance abnormal suggesting fluid accumulation starting 07/21/2018 but is trending back toward baseline.  Prescribed:Furosemide40 mg take 1 tablet daily. Potassium 20 mEq 1 tablet daily  Labs: 01/04/2018 Creatinine0.69, BUN10, Potassium4.0, Sodium141, GFR>60  12/30/2017 Creatinine0.81, BUN12, Potassium4.1, Sodium143, [id number] A complete set of results can be found in Results Review.  Recommendations:Advsied to take Furosemide 40 mg twice a day x 1 day and then return to prescribed dosage.  Follow-up plan: ICM clinic phone appointment on4/17/2020 (manual send) to recheck fluid levels.    Copy of ICM check sent to Dr.Allred and Dr Bronson Ing.  3 month ICM trend: 07/28/2018    1 Year ICM trend:       [name], RN 07/28/2018 1:15 PM

## 2018-08-01 ENCOUNTER — Telehealth: Payer: Self-pay

## 2018-08-01 ENCOUNTER — Ambulatory Visit (INDEPENDENT_AMBULATORY_CARE_PROVIDER_SITE_OTHER): Payer: BLUE CROSS/BLUE SHIELD

## 2018-08-01 ENCOUNTER — Other Ambulatory Visit: Payer: Self-pay

## 2018-08-01 DIAGNOSIS — I5022 Chronic systolic (congestive) heart failure: Secondary | ICD-10-CM

## 2018-08-01 DIAGNOSIS — Z95810 Presence of automatic (implantable) cardiac defibrillator: Secondary | ICD-10-CM

## 2018-08-01 NOTE — Telephone Encounter (Signed)
Left message for patient to remind of missed remote transmission.  

## 2018-08-04 NOTE — Progress Notes (Signed)
EPIC Encounter for ICM Monitoring  Patient Name: Loretta Thomas is a 68 y.o. female Date: 08/04/2018 Primary Care Physican: Rossie Muskrat, DO Primary Cardiologist:Koneswaran Electrophysiologist:Allred Bi-V Pacing:>99% LastWeight:173lbs 07/28/2018 Weight:does not weigh  Transmission reviwed   Thoracic impedance returned to normalafter taking extra Furosemide.  Prescribed:Furosemide40 mg take 1 tablet daily. Potassium 20 mEq 1 tablet daily  Labs: 01/04/2018 Creatinine0.69, BUN10, Potassium4.0, Sodium141, GFR>60  12/30/2017 Creatinine0.81, BUN12, Potassium4.1, Sodium143, DEY81-44 A complete set of results can be found in Results Review.  Recommendations:None  Follow-up plan: ICM clinic phone appointment on5/18/2020.    Copy of ICM check sent to Dr.Allred.  Direct Trend: 08/03/2018     [name], RN 08/04/2018 8:37 AM

## 2018-08-06 NOTE — Progress Notes (Signed)
Patient called and asked about her fluid levels. Advised fluid levels returned to normal.  She said she is feeling fine now.  Encouraged to call for any fluid symptoms.

## 2018-08-11 ENCOUNTER — Other Ambulatory Visit: Payer: Self-pay | Admitting: *Deleted

## 2018-08-11 MED ORDER — CYANOCOBALAMIN 1000 MCG/ML IJ SOLN: 1000.0000 ug | INTRAMUSCULAR | 0 refills | Status: DC

## 2018-09-01 ENCOUNTER — Ambulatory Visit (INDEPENDENT_AMBULATORY_CARE_PROVIDER_SITE_OTHER): Payer: BLUE CROSS/BLUE SHIELD

## 2018-09-01 ENCOUNTER — Other Ambulatory Visit: Payer: Self-pay

## 2018-09-01 DIAGNOSIS — Z95810 Presence of automatic (implantable) cardiac defibrillator: Secondary | ICD-10-CM

## 2018-09-01 DIAGNOSIS — I5022 Chronic systolic (congestive) heart failure: Secondary | ICD-10-CM | POA: Diagnosis not present

## 2018-09-03 NOTE — Progress Notes (Signed)
EPIC Encounter for ICM Monitoring  Patient Name: Loretta Thomas is a 68 y.o. female Date: 09/03/2018 Primary Care Physican: Rossie Muskrat, DO Primary Cardiologist:Koneswaran Electrophysiologist:Allred Bi-V Pacing:>99% LastWeight:173lbs 4/13/2020Weight:does not weigh  Transmission reviewed.  Thoracic impedancenorma.  Prescribed:Furosemide40 mg take 1 tablet daily. Potassium 20 mEq 1 tablet daily  Labs: 01/04/2018 Creatinine0.69, BUN10, Potassium4.0, Sodium141, GFR>60  12/30/2017 Creatinine0.81, BUN12, Potassium4.1, Sodium143, PET62-44 A complete set of results can be found in Results Review.  Recommendations:None  Follow-up plan: ICM clinic phone appointment 10/07/2018.    Copy of ICM check sent to Dr.Allred.    3 month ICM trend: 09/01/2018    1 Year ICM trend:       [name], RN 09/03/2018 1:32 PM

## 2018-09-09 ENCOUNTER — Telehealth: Payer: Self-pay

## 2018-09-09 ENCOUNTER — Telehealth: Payer: Self-pay | Admitting: Cardiovascular Disease

## 2018-09-09 NOTE — Telephone Encounter (Signed)
Pt was given medications listed below for bronchitis and fluid build up in ears. Wanted Dr Court Joy recs if ok to take with current cardiac meds

## 2018-09-09 NOTE — Telephone Encounter (Signed)
Returned patient's call and reviewed [facility] report as she asked.  She has sinus infection and was prescribed several medications and has contacted Dr Court Joy office to check if there would be interactions between the new meds and cardiac meds she is taking now.  Advised the report has updated through 5-24 and it suggests dryness from 5/19 - 5/24.  Advised to increase fluid intake above 64 oz for a couple of days. She is tired of staying in the house due to the [place] 19 and hopes she will be able to get out of the house more soon.    [facility] Insurance account manager through 5/24

## 2018-09-09 NOTE — Telephone Encounter (Signed)
Pt aware and appreciative  

## 2018-09-09 NOTE — Telephone Encounter (Signed)
CETIRIZINE 10 MG PREDNISONE  CEFUROXIMEAXETIL 500MG  AZITHROMYCIN 500MG   Pt wants to make sure it's ok that she takes these medications.

## 2018-09-09 NOTE — Telephone Encounter (Signed)
Should be fine.

## 2018-09-16 ENCOUNTER — Other Ambulatory Visit: Payer: Self-pay

## 2018-09-16 DIAGNOSIS — C50411 Malignant neoplasm of upper-outer quadrant of right female breast: Secondary | ICD-10-CM

## 2018-09-16 MED ORDER — VENLAFAXINE HCL ER 37.5 MG PO CP24: 37.5000 mg | ORAL_CAPSULE | Freq: Every day | ORAL | 0 refills | Status: DC

## 2018-09-23 ENCOUNTER — Other Ambulatory Visit: Payer: Self-pay | Admitting: *Deleted

## 2018-09-23 NOTE — Progress Notes (Signed)
Received call from pt wanting to know if she would need to have her breast MRI this year.  Pt states in September 2019 she had a pacemaker placed.  RN verified with [facility] imaging and she will not be able to have it done due to the pace maker.  Dr. Lindi Adie notified and stated pt will continue to have yearly mammograms only.  Pt notified and verbalized understanding.

## 2018-10-06 ENCOUNTER — Ambulatory Visit (INDEPENDENT_AMBULATORY_CARE_PROVIDER_SITE_OTHER): Payer: BC Managed Care – PPO | Admitting: *Deleted

## 2018-10-06 DIAGNOSIS — I429 Cardiomyopathy, unspecified: Secondary | ICD-10-CM | POA: Diagnosis not present

## 2018-10-07 ENCOUNTER — Ambulatory Visit (INDEPENDENT_AMBULATORY_CARE_PROVIDER_SITE_OTHER): Payer: BC Managed Care – PPO

## 2018-10-07 DIAGNOSIS — Z95810 Presence of automatic (implantable) cardiac defibrillator: Secondary | ICD-10-CM

## 2018-10-07 DIAGNOSIS — I5022 Chronic systolic (congestive) heart failure: Secondary | ICD-10-CM | POA: Diagnosis not present

## 2018-10-07 LAB — CUP PACEART REMOTE DEVICE CHECK
Battery Remaining Longevity: 80 mo
Battery Remaining Percentage: 88 %
Battery Voltage: 3.05 V
Brady Statistic AP VP Percent: 2.7 %
Brady Statistic AP VS Percent: 1 %
Brady Statistic AS VP Percent: 97 %
Brady Statistic AS VS Percent: 1 %
Brady Statistic RA Percent Paced: 2.7 %
Date Time Interrogation Session: 20200622123959
HighPow Impedance: 78 Ohm
HighPow Impedance: 78 Ohm
Implantable Lead Implant Date: 20190920
Implantable Lead Implant Date: 20190920
Implantable Lead Implant Date: 20190920
Implantable Lead Location: 753858
Implantable Lead Location: 753859
Implantable Lead Location: 753860
Implantable Pulse Generator Implant Date: 20190920
Lead Channel Impedance Value: 1125 Ohm
Lead Channel Impedance Value: 440 Ohm
Lead Channel Impedance Value: 490 Ohm
Lead Channel Pacing Threshold Amplitude: 0.5 V
Lead Channel Pacing Threshold Amplitude: 0.5 V
Lead Channel Pacing Threshold Amplitude: 1.125 V
Lead Channel Pacing Threshold Pulse Width: 0.5 ms
Lead Channel Pacing Threshold Pulse Width: 0.5 ms
Lead Channel Pacing Threshold Pulse Width: 0.5 ms
Lead Channel Sensing Intrinsic Amplitude: 11.8 mV
Lead Channel Sensing Intrinsic Amplitude: 4.1 mV
Lead Channel Setting Pacing Amplitude: 2 V
Lead Channel Setting Pacing Amplitude: 2 V
Lead Channel Setting Pacing Amplitude: 2.125
Lead Channel Setting Pacing Pulse Width: 0.5 ms
Lead Channel Setting Pacing Pulse Width: 0.5 ms
Lead Channel Setting Sensing Sensitivity: 0.5 mV
Pulse Gen Serial Number: 9833194

## 2018-10-07 NOTE — Progress Notes (Signed)
EPIC Encounter for ICM Monitoring  Patient Name: Loretta Thomas is a 68 y.o. female Date: 10/07/2018 Primary Care Physican: Rossie Muskrat, DO Primary Cardiologist:Koneswaran Electrophysiologist:Allred Bi-V Pacing:>99% LastWeight:173lbs 4/13/2020Weight:does not weigh  Spoke with patient and her fingers are swollen. She said she does not have any energy. She is worried about her cholesterol levels being high but her PCP has not prescribed any medication.  She wants to fax the cholesterol results to Dr Dwana Curd for his review.  She is not clear the condition of her heart.  Advised to call Dr Court Joy office to schedule an appointment if she is not feeling well and has questions regarding cholesterol and heart condition.   [facility] thoracic impedancenormal suggesting possible fluid accumulation since 10/04/2018.  Prescribed:Furosemide40 mg take 1 tablet daily. Potassium 20 mEq 1 tablet daily  Labs: 01/04/2018 Creatinine0.69, BUN10, Potassium4.0, Sodium141, GFR>60  12/30/2017 Creatinine0.81, BUN12, Potassium4.1, Sodium143, [id number] A complete set of results can be found in Results Review.  Recommendations: Advised to take Furosemide 40 mg 1.5 tablets (60 mg total) daily x 3 days.  She prefers to try 60 mg of Furosemide instead of doubling.   Follow-up plan: ICM clinic phone appointment 10/15/2018 to recheck fluid levels.    Copy of ICM check sent to Dr.Allred and Dr Dwana Curd.  3 month ICM trend: 10/06/2018    1 Year ICM trend:       [name], RN 10/07/2018 2:31 PM

## 2018-10-10 ENCOUNTER — Other Ambulatory Visit: Payer: Self-pay

## 2018-10-10 MED ORDER — LETROZOLE 2.5 MG PO TABS: 2.5000 mg | ORAL_TABLET | Freq: Every day | ORAL | 3 refills | Status: DC

## 2018-10-15 ENCOUNTER — Ambulatory Visit (INDEPENDENT_AMBULATORY_CARE_PROVIDER_SITE_OTHER): Payer: BC Managed Care – PPO

## 2018-10-15 DIAGNOSIS — I5022 Chronic systolic (congestive) heart failure: Secondary | ICD-10-CM

## 2018-10-15 DIAGNOSIS — Z95810 Presence of automatic (implantable) cardiac defibrillator: Secondary | ICD-10-CM

## 2018-10-15 NOTE — Progress Notes (Signed)
EPIC Encounter for ICM Monitoring  Patient Name: Loretta Thomas is a 68 y.o. female Date: 10/15/2018 Primary Care Physican: Rossie Muskrat, DO Primary Cardiologist:Koneswaran Electrophysiologist:Allred Bi-V Pacing:>99% 7/1/2020Weight:175 lbs  Spoke with patient and she is feeling better.  No more swelling in the fingers and energy level is better.   [facility] thoracic impedancereturned to normal since 6/23 remote transmission and taking extra Furosemide x 3 days.   Prescribed:Furosemide40 mg take 1 tablet daily. Potassium 20 mEq 1 tablet daily  Labs: 01/04/2018 Creatinine0.69, BUN10, Potassium4.0, Sodium141, GFR>60  12/30/2017 Creatinine0.81, BUN12, Potassium4.1, Sodium143, ZXY81-18 A complete set of results can be found in Results Review.  Recommendations: Advised to take Furosemide 40 mg 1.5 tablets (60 mg total) daily x 3 days.  She prefers to try 60 mg of Furosemide instead of doubling.   Follow-up plan: ICM clinic phone appointment 11/10/2018 to recheck fluid levels.    Copy of ICM check sent to Dr.Allred.   3 month ICM trend: 10/15/2018    1 Year ICM trend:       [name], RN 10/15/2018 4:27 PM

## 2018-10-16 NOTE — Progress Notes (Signed)
Remote ICD transmission.   

## 2018-11-10 ENCOUNTER — Ambulatory Visit (INDEPENDENT_AMBULATORY_CARE_PROVIDER_SITE_OTHER): Payer: Federal, State, Local not specified - PPO

## 2018-11-10 DIAGNOSIS — Z95810 Presence of automatic (implantable) cardiac defibrillator: Secondary | ICD-10-CM

## 2018-11-10 DIAGNOSIS — I5022 Chronic systolic (congestive) heart failure: Secondary | ICD-10-CM

## 2018-11-12 NOTE — Progress Notes (Signed)
EPIC Encounter for ICM Monitoring  Patient Name: Ronny Ruddell is a 68 y.o. female Date: 11/12/2018 Primary Care Physican: Rossie Muskrat, DO Primary Cardiologist:Koneswaran Electrophysiologist:Allred Bi-V Pacing:>99% 7/29/2020Weight:173 lbs  Spoke with patient and she said she is feeling best she has felt in 5 years.   [facility] thoracic impedancesuggesting possible fluid accumulation since 10/31/2018 but is back at baseline 11/12/2018.  Prescribed:Furosemide40 mg take 1 tablet daily. Potassium 20 mEq 1 tablet daily  Labs: 01/04/2018 Creatinine0.69, BUN10, Potassium4.0, Sodium141, GFR>60  12/30/2017 Creatinine0.81, BUN12, Potassium4.1, Sodium143, [id number] A complete set of results can be found in Results Review.  Recommendations: No changes and encouraged to call if experiencing any fluid symptoms.  Follow-up plan: ICM clinic phone appointment 12/15/2018.    Copy of ICM check sent to Dr.Allred.   3 month ICM trend: 11/10/2018    1 Year ICM trend:       [name], RN 11/12/2018 2:51 PM

## 2018-11-14 ENCOUNTER — Other Ambulatory Visit: Payer: Self-pay | Admitting: *Deleted

## 2018-11-14 DIAGNOSIS — C50411 Malignant neoplasm of upper-outer quadrant of right female breast: Secondary | ICD-10-CM

## 2018-11-14 MED ORDER — VENLAFAXINE HCL ER 37.5 MG PO CP24: 37.5000 mg | ORAL_CAPSULE | Freq: Every day | ORAL | 0 refills | Status: DC

## 2018-11-19 ENCOUNTER — Other Ambulatory Visit: Payer: Self-pay | Admitting: Cardiovascular Disease

## 2018-11-19 DIAGNOSIS — I5022 Chronic systolic (congestive) heart failure: Secondary | ICD-10-CM

## 2018-12-08 ENCOUNTER — Telehealth: Payer: Self-pay

## 2018-12-08 NOTE — Telephone Encounter (Signed)
Returned patient call as requested by voice mail message. She gained 4 lbs within last week.  Today's weight is 179 lbs and baseline is 174-175 lbs and hands feel tight. She reports she generally feel well.  She sent remote transmission and results shows possible fluid accumulation starting 8/21-8/23 but back at baseline today.  Also possible fluid accumulation from 8/15 - 8/19.  She will take extra 1/2 tablet of Furosemide for 1-2 days and continue to weight daily.  Will recheck remote transmission on 12/15/2018.  [facility] ICM Direct Trend:  12/08/2018

## 2018-12-15 ENCOUNTER — Ambulatory Visit (INDEPENDENT_AMBULATORY_CARE_PROVIDER_SITE_OTHER): Payer: BC Managed Care – PPO

## 2018-12-15 DIAGNOSIS — I5022 Chronic systolic (congestive) heart failure: Secondary | ICD-10-CM | POA: Diagnosis not present

## 2018-12-15 DIAGNOSIS — Z95810 Presence of automatic (implantable) cardiac defibrillator: Secondary | ICD-10-CM | POA: Diagnosis not present

## 2018-12-16 NOTE — Progress Notes (Signed)
EPIC Encounter for ICM Monitoring  Patient Name: Loretta Thomas is a 68 y.o. female Date: 12/16/2018 Primary Care Physican: Rossie Muskrat, DO Primary Cardiologist:Koneswaran Electrophysiologist:Allred Bi-V Pacing:>99% 7/29/2020Weight:173 lbs  Spoke with patient Loretta Thomas is doing well.  [facility] thoracic impedancenormal but suggested possible fluid accumulation since 11/28/2018 -12/07/2018.  Prescribed:Furosemide40 mg take 1 tablet daily. Potassium 20 mEq 1 tablet daily  Labs: 01/04/2018 Creatinine0.69, BUN10, Potassium4.0, Sodium141, GFR>60  12/30/2017 Creatinine0.81, BUN12, Potassium4.1, Sodium143, [id number] A complete set of results can be found in Results Review.  Recommendations: No changes and encouraged to call if experiencing any fluid symptoms.  Follow-up plan: ICM clinic phone appointment on 02/02/2019.   91 day device clinic remote transmission 01/06/2019.  Office appt 01/09/2019 with Dr. Bronson Ing.    Copy of ICM check sent to Dr. Rayann Heman.   3 month ICM trend: 12/15/2018    1 Year ICM trend:       [name], RN 12/16/2018 11:58 AM

## 2019-01-06 ENCOUNTER — Ambulatory Visit (INDEPENDENT_AMBULATORY_CARE_PROVIDER_SITE_OTHER): Payer: BC Managed Care – PPO | Admitting: *Deleted

## 2019-01-06 DIAGNOSIS — I5022 Chronic systolic (congestive) heart failure: Secondary | ICD-10-CM

## 2019-01-06 DIAGNOSIS — I429 Cardiomyopathy, unspecified: Secondary | ICD-10-CM

## 2019-01-06 LAB — CUP PACEART REMOTE DEVICE CHECK
Battery Remaining Longevity: 77 mo
Battery Remaining Percentage: 85 %
Battery Voltage: 3.02 V
Brady Statistic AP VP Percent: 2.2 %
Brady Statistic AP VS Percent: 1 %
Brady Statistic AS VP Percent: 98 %
Brady Statistic AS VS Percent: 1 %
Brady Statistic RA Percent Paced: 2.2 %
Date Time Interrogation Session: 20200922060016
HighPow Impedance: 74 Ohm
HighPow Impedance: 74 Ohm
Implantable Lead Implant Date: 20190920
Implantable Lead Implant Date: 20190920
Implantable Lead Implant Date: 20190920
Implantable Lead Location: 753858
Implantable Lead Location: 753859
Implantable Lead Location: 753860
Implantable Pulse Generator Implant Date: 20190920
Lead Channel Impedance Value: 1050 Ohm
Lead Channel Impedance Value: 440 Ohm
Lead Channel Impedance Value: 490 Ohm
Lead Channel Pacing Threshold Amplitude: 0.5 V
Lead Channel Pacing Threshold Amplitude: 0.625 V
Lead Channel Pacing Threshold Amplitude: 1 V
Lead Channel Pacing Threshold Pulse Width: 0.5 ms
Lead Channel Pacing Threshold Pulse Width: 0.5 ms
Lead Channel Pacing Threshold Pulse Width: 0.5 ms
Lead Channel Sensing Intrinsic Amplitude: 11.8 mV
Lead Channel Sensing Intrinsic Amplitude: 3.8 mV
Lead Channel Setting Pacing Amplitude: 2 V
Lead Channel Setting Pacing Amplitude: 2 V
Lead Channel Setting Pacing Amplitude: 2 V
Lead Channel Setting Pacing Pulse Width: 0.5 ms
Lead Channel Setting Pacing Pulse Width: 0.5 ms
Lead Channel Setting Sensing Sensitivity: 0.5 mV
Pulse Gen Serial Number: 9833194

## 2019-01-08 ENCOUNTER — Other Ambulatory Visit: Payer: Self-pay | Admitting: Cardiovascular Disease

## 2019-01-09 ENCOUNTER — Encounter: Payer: Self-pay | Admitting: Cardiovascular Disease

## 2019-01-09 ENCOUNTER — Ambulatory Visit (INDEPENDENT_AMBULATORY_CARE_PROVIDER_SITE_OTHER): Payer: BC Managed Care – PPO | Admitting: Cardiovascular Disease

## 2019-01-09 ENCOUNTER — Other Ambulatory Visit: Payer: Self-pay

## 2019-01-09 VITALS — BP 126/85 | HR 83 | Ht 67.0 in | Wt 178.2 lb

## 2019-01-09 DIAGNOSIS — Z23 Encounter for immunization: Secondary | ICD-10-CM

## 2019-01-09 DIAGNOSIS — I447 Left bundle-branch block, unspecified: Secondary | ICD-10-CM

## 2019-01-09 DIAGNOSIS — I519 Heart disease, unspecified: Secondary | ICD-10-CM

## 2019-01-09 DIAGNOSIS — I428 Other cardiomyopathies: Secondary | ICD-10-CM

## 2019-01-09 DIAGNOSIS — I10 Essential (primary) hypertension: Secondary | ICD-10-CM

## 2019-01-09 DIAGNOSIS — I5042 Chronic combined systolic (congestive) and diastolic (congestive) heart failure: Secondary | ICD-10-CM

## 2019-01-09 DIAGNOSIS — Z95810 Presence of automatic (implantable) cardiac defibrillator: Secondary | ICD-10-CM | POA: Diagnosis not present

## 2019-01-09 NOTE — Patient Instructions (Signed)
Medication Instructions:   Your physician recommends that you continue on your current medications as directed. Please refer to the Current Medication list given to you today.  Labwork:  NONE  Testing/Procedures:  NONE  Follow-Up:  Your physician recommends that you schedule a follow-up appointment in: 3 months.  Any Other Special Instructions Will Be Listed Below (If Applicable).  If you need a refill on your cardiac medications before your next appointment, please call your pharmacy. 

## 2019-01-09 NOTE — Progress Notes (Signed)
SUBJECTIVE: The patient presents for routine follow-up.  Thoracic impedance was normal on 12/15/2018 but suggested possible fluid accumulation from 8/14-8/23.  She has a history of chronic combined heart failure.  Echocardiogram on 05/21/2018 showed severely reduced left ventricular systolic function, LVEF [id number].  There was mild left ventricular dilatation, diastolic dysfunction, and elevated left ventricular filling pressures.  She underwent biventricular ICD implantation in September 2019.  She had mild nonobstructive coronary disease by coronary angiography in December 2013 in [place], [place].  She is doing well and denies chest pain, leg swelling, and palpitations.  She sleeps in a love seat with her head propped up.  She denies paroxysmal nocturnal dyspnea.  She tries to avoid low-sodium foods.  Within the past few months she had a frozen Chinese dinner and she felt the fluid buildup.  She has chronic exertional dyspnea which is stable.  She also has alpha gal syndrome and has multiple food allergies.  She has been eating more protein with Boost shakes and avocados and energy levels have improved.  She requests a flu shot.    Review of Systems: As per "subjective", otherwise negative.  Allergies  Allergen Reactions  . Codeine     Stomach Pains  . Demerol [Meperidine]     Upset stomach  . Hydrocodone   . Lortab [Hydrocodone-Acetaminophen]     Upset stomach    Current Outpatient Medications  Medication Sig Dispense Refill  . acetaminophen (TYLENOL) 500 MG tablet Take 500 mg by mouth every 6 (six) hours as needed.    . ALPRAZolam (XANAX) 0.5 MG tablet Take 0.5 mg by mouth every 8 (eight) hours as needed. for pain    . Calcium Carbonate-Vitamin D 600-400 MG-UNIT tablet Take 1 tablet by mouth 2 (two) times daily.    . carvedilol (COREG) 3.125 MG tablet TAKE 1 TABLET (3.125 MG TOTAL) BY MOUTH 2 (TWO) TIMES DAILY WITH A MEAL. 60 tablet 6  . clemastine (TAVIST) 2.68 MG  TABS tablet Take 2.68 mg by mouth 2 (two) times daily.    . cyanocobalamin (,VITAMIN B-12,) 1000 MCG/ML injection Inject 1 mL (1,000 mcg total) into the muscle every 30 (thirty) days. 10 mL 0  . famotidine (PEPCID) 40 MG tablet Take 40 mg by mouth daily as needed.   6  . fluconazole (DIFLUCAN) 150 MG tablet Take 1 tablet by mouth every three (3) days as needed.    . furosemide (LASIX) 40 MG tablet TAKE 1 TABLET BY MOUTH EVERY DAY 90 tablet 1  . letrozole (FEMARA) 2.5 MG tablet Take 1 tablet (2.5 mg total) by mouth daily. 90 tablet 3  . loratadine (CLARITIN) 10 MG tablet Take 10 mg by mouth daily.    Marland Kitchen losartan (COZAAR) 50 MG tablet Take 1 tablet (50 mg total) by mouth daily. 90 tablet 2  . potassium chloride SA (K-DUR,KLOR-CON) 20 MEQ tablet Take 1 tablet (20 mEq total) by mouth daily. 90 tablet 2  . triamcinolone cream (KENALOG) 0.1 % Apply 1 application topically 2 (two) times daily as needed for itching.    . venlafaxine XR (EFFEXOR-XR) 37.5 MG 24 hr capsule Take 1 capsule (37.5 mg total) by mouth daily with breakfast. 90 capsule 0  . Vitamin D, Ergocalciferol, (DRISDOL) 50000 UNITS CAPS capsule Take 50,000 Units by mouth every 7 (seven) days. Fridays    . zolpidem (AMBIEN) 10 MG tablet Take 10 mg by mouth at bedtime.     No current facility-administered medications for this visit.  Past Medical History:  Diagnosis Date  . Anxiety   . Bladder cystocele   . Breast cancer ([place])    s/p L breast cancer removal  . Bundle branch block, left    diagnosed 03/2012 followed by cath showing only minimal CAD  . Bursitis   . Cancer ([place])    right breast  . Capsulitis   . Diverticulosis   . Elevated liver enzymes 2007  . History of IBS   . Hyperlipidemia   . Hypertension   . Nonischemic cardiomyopathy ([place])   . pernicious anemia     Past Surgical History:  Procedure Laterality Date  . ANKLE FRACTURE SURGERY    . BIV ICD INSERTION CRT-D N/A 01/03/2018    [facility] [employer]  MP model [id number] (serial  Number [id number]) biventricular ICD for primary prevention of sudden death  . BREAST BIOPSY     right breast  . BREAST RECONSTRUCTION WITH PLACEMENT OF TISSUE EXPANDER AND FLEX HD (ACELLULAR HYDRATED DERMIS) Right 01/28/2014   Procedure: RIGHT BREAST RECONSTRUCTION WITH PLACEMENT OF TISSUE EXPANDER;  Surgeon: Crissie Reese, MD;  Location: [facility];  Service: Plastics;  Laterality: Right;  . CARDIAC CATHETERIZATION     x3; 03/18/12 [facility]): 10-20% pLAD, <20% ostial LCX, 20% mPLA, EF 60%.    . CHOLECYSTECTOMY    . COLONOSCOPY     x4  . DILATION AND CURETTAGE OF UTERUS    . MASTECTOMY Right    malignant  . REDUCTION MAMMAPLASTY Left   . RHINOPLASTY    . SIMPLE MASTECTOMY WITH AXILLARY SENTINEL NODE BIOPSY Right 01/28/2014   Procedure: RIGHT TOTAL MASTECTOMY WITH  RIGHT AXILLARY SENTINEL NODE BIOPSY;  Surgeon: Excell Seltzer, MD;  Location: [facility];  Service: General;  Laterality: Right;  . TONSILLECTOMY AND ADENOIDECTOMY    . TUBAL LIGATION      Social History   Socioeconomic History  . Marital status: Married    Spouse name: Not on file  . Number of children: Not on file  . Years of education: Not on file  . Highest education level: Not on file  Occupational History  . Not on file  Social Needs  . Financial resource strain: Not on file  . Food insecurity    Worry: Not on file    Inability: Not on file  . Transportation needs    Medical: Not on file    Non-medical: Not on file  Tobacco Use  . Smoking status: Never Smoker  . Smokeless tobacco: Never Used  Substance and Sexual Activity  . Alcohol use: No  . Drug use: No  . Sexual activity: Yes  Lifestyle  . Physical activity    Days per week: Not on file    Minutes per session: Not on file  . Stress: Not on file  Relationships  . Social Herbalist on phone: Not on file    Gets together: Not on file    Attends religious service: Not on file    Active member of  club or organization: Not on file    Attends meetings of clubs or organizations: Not on file    Relationship status: Not on file  . Intimate partner violence    Fear of current or ex partner: Not on file    Emotionally abused: Not on file    Physically abused: Not on file    Forced sexual activity: Not on file  Other Topics Concern  . Not on file  Social History Narrative   Lives in [place]:   [birth year] 1047  BP: 126/85  Pulse: 83  SpO2: 95%  Weight: 178 lb 3.2 oz (80.8 kg)  Height: 5\' 7"  (1.702 m)    Wt Readings from Last 3 Encounters:  01/09/19 178 lb 3.2 oz (80.8 kg)  05/27/18 181 lb (82.1 kg)  05/12/18 176 lb (79.8 kg)     PHYSICAL EXAM General: NAD HEENT: Normal. Neck: No JVD, no thyromegaly. Lungs: Clear to auscultation bilaterally with normal respiratory effort. CV: Regular rate and rhythm, normal S1/S2, no S3/S4, no murmur. No pretibial or periankle edema.  No carotid bruit.   Abdomen: Soft, nontender, no distention.  Neurologic: Alert and oriented.  Psych: Normal affect. Skin: Normal. Musculoskeletal: No gross deformities.      Labs: Lab Results  Component Value Date/Time   K 4.0 01/04/2018 04:24 AM   K 4.2 06/21/2014 10:35 AM   BUN 10 01/04/2018 04:24 AM   BUN 12 12/30/2017 09:57 AM   BUN 9.7 06/21/2014 10:35 AM   CREATININE 0.69 01/04/2018 04:24 AM   CREATININE 0.8 06/21/2014 10:35 AM   ALT <6 06/21/2014 10:35 AM   TSH 2.125 06/21/2014 10:35 AM   HGB 12.3 12/30/2017 09:57 AM   HGB 12.0 06/21/2014 10:35 AM     Lipids: Lab Results  Component Value Date/Time   LDLCALC 77 06/21/2014 10:35 AM   CHOL 153 06/21/2014 10:35 AM   TRIG 150 (H) 06/21/2014 10:35 AM   HDL 46 06/21/2014 10:35 AM       ASSESSMENT AND PLAN: 1.  Chronic combined heart failure: LVEF remains at 20% by echocardiogram on 05/21/2018.  She developed a rash with Entresto.  She remains on carvedilol 3.125 mg twice daily, Lasix 40 mg daily along with supplemental  potassium, and losartan 50 mg daily.  She appears euvolemic. No changes to therapy.  We will administer a flu shot.  2.  Biventricular ICD: Implantation in September 2019.  LVEF remains severely reduced at 20% by echocardiogram on 05/21/2018.  Denies ICD shocks.  3.  Hypertension: Blood pressure is normal.  No changes to therapy.    Disposition: Follow up 3 months   Kate Sable, M.D., F.A.C.C.

## 2019-01-16 ENCOUNTER — Other Ambulatory Visit: Payer: Self-pay | Admitting: *Deleted

## 2019-01-16 MED ORDER — LOSARTAN POTASSIUM 50 MG PO TABS: 50.0000 mg | ORAL_TABLET | Freq: Every day | ORAL | 0 refills | Status: DC

## 2019-01-16 MED ORDER — POTASSIUM CHLORIDE CRYS ER 20 MEQ PO TBCR: 20.0000 meq | EXTENDED_RELEASE_TABLET | Freq: Every day | ORAL | 0 refills | Status: DC

## 2019-01-16 NOTE — Progress Notes (Signed)
Remote ICD transmission.   

## 2019-02-02 ENCOUNTER — Ambulatory Visit (INDEPENDENT_AMBULATORY_CARE_PROVIDER_SITE_OTHER): Payer: Federal, State, Local not specified - PPO

## 2019-02-02 DIAGNOSIS — I5042 Chronic combined systolic (congestive) and diastolic (congestive) heart failure: Secondary | ICD-10-CM | POA: Diagnosis not present

## 2019-02-02 DIAGNOSIS — Z95810 Presence of automatic (implantable) cardiac defibrillator: Secondary | ICD-10-CM

## 2019-02-04 ENCOUNTER — Telehealth: Payer: Self-pay

## 2019-02-04 NOTE — Telephone Encounter (Signed)
Remote ICM transmission received.  Attempted call to patient regarding ICM remote transmission and no answer.  

## 2019-02-04 NOTE — Progress Notes (Signed)
EPIC Encounter for ICM Monitoring  Patient Name: Zakariah Parnes is a 68 y.o. female Date: 02/04/2019 Primary Care Physican: Rossie Muskrat, DO Primary Cardiologist:Koneswaran Electrophysiologist:Allred Bi-V Pacing:>99% 7/29/2020Weight:173lbs  Attempted call to patient and unable to reach.  Transmission reviewed.   Corvue thoracic impedancenormal but suggested possible fluid accumulation since 01/28/2019 -01/31/2019.  Prescribed:Furosemide40 mg take 1 tablet daily. Potassium 20 mEq 1 tablet daily  Labs: 01/04/2018 Creatinine0.69, BUN10, Potassium4.0, Sodium141, GFR>60  12/30/2017 Creatinine0.81, BUN12, Potassium4.1, Sodium143, [id number] A complete set of results can be found in Results Review.  Recommendations: Unable to reach.    Follow-up plan: ICM clinic phone appointment on 03/09/2019.   91 day device clinic remote transmission 04/07/2019.  Office appt 03/20/2019 with Dr. Rayann Heman.    Copy of ICM check sent to Dr. Rayann Heman.   3 month ICM trend: 02/02/2019    1 Year ICM trend:       [name], RN 02/04/2019 9:20 AM

## 2019-02-06 ENCOUNTER — Other Ambulatory Visit: Payer: Self-pay | Admitting: *Deleted

## 2019-02-06 DIAGNOSIS — I5022 Chronic systolic (congestive) heart failure: Secondary | ICD-10-CM

## 2019-02-06 MED ORDER — FUROSEMIDE 40 MG PO TABS: 40.0000 mg | ORAL_TABLET | Freq: Every day | ORAL | 1 refills | Status: DC

## 2019-03-02 ENCOUNTER — Telehealth: Payer: Self-pay | Admitting: Internal Medicine

## 2019-03-02 NOTE — Telephone Encounter (Signed)
New message  Patient states may need to increase fluid medication.    Pt c/o of Chest Pain: STAT if CP now or developed within 24 hours  1. Are you having CP right now? no  2. Are you experiencing any other symptoms (ex. SOB, nausea, vomiting, sweating)? no  3. How long have you been experiencing CP? 1 week  4. Is your CP continuous or coming and going? Coming and going  5. Have you taken Nitroglycerin? no ?

## 2019-03-02 NOTE — Telephone Encounter (Signed)
Call to patient.  Patient reports fatigue and generally not feeling well.  She is unable to determine if she has fluid accumulation or if she has sinus congestion.   Assisted in sending remote transmission and reviewed.  Report suggests fluid levels are normal.  She thinks her fatigue and chest congestion are related to the sinus problems she is having.  She will contact PCP regarding sinus drainage.

## 2019-03-02 NOTE — Telephone Encounter (Signed)
Reports chest tightness rated 4/10 and pain in left shoulder rated 1/10, some swelling. Has not weighed. Runny nose, feels like fluid in ears, no energy, cough, and pain in teeth  Denies fever, chills, sore throat, headache, body ache, loss of taste or smell or increased sob. Says she has sob all the time with exertion Weight today while on phone 179.8 lbs after eating. Normal weight before eating around 177 lbs.  Medications reviewed. Advised to contact PCP about respiratory symptoms and that message would be sent to [facility] nurse to check fluid status per patient request.

## 2019-03-09 ENCOUNTER — Telehealth: Payer: Self-pay | Admitting: Internal Medicine

## 2019-03-09 ENCOUNTER — Ambulatory Visit (INDEPENDENT_AMBULATORY_CARE_PROVIDER_SITE_OTHER): Payer: Federal, State, Local not specified - PPO

## 2019-03-09 DIAGNOSIS — Z95810 Presence of automatic (implantable) cardiac defibrillator: Secondary | ICD-10-CM

## 2019-03-09 DIAGNOSIS — I5042 Chronic combined systolic (congestive) and diastolic (congestive) heart failure: Secondary | ICD-10-CM | POA: Diagnosis not present

## 2019-03-09 NOTE — Telephone Encounter (Signed)
Virtual Visit Pre-Appointment Phone Call  "(Name), I am calling you today to discuss your upcoming appointment. We are currently trying to limit exposure to the virus that causes COVID-19 by seeing patients at home rather than in the office."  1. "What is the BEST phone number to call the day of the visit?" - include this in appointment notes  2. Do you have or have access to (through a family member/friend) a smartphone with video capability that we can use for your visit?" a. If yes - list this number in appt notes as cell (if different from BEST phone #) and list the appointment type as a VIDEO visit in appointment notes b. If no - list the appointment type as a PHONE visit in appointment notes  3. Confirm consent - "In the setting of the current Covid19 crisis, you are scheduled for a (phone or video) visit with your provider on (date) at (time).  Just as we do with many in-office visits, in order for you to participate in this visit, we must obtain consent.  If you'd like, I can send this to your mychart (if signed up) or email for you to review.  Otherwise, I can obtain your verbal consent now.  All virtual visits are billed to your insurance company just like a normal visit would be.  By agreeing to a virtual visit, we'd like you to understand that the technology does not allow for your provider to perform an examination, and thus may limit your provider's ability to fully assess your condition. If your provider identifies any concerns that need to be evaluated in person, we will make arrangements to do so.  Finally, though the technology is pretty good, we cannot assure that it will always work on either your or our end, and in the setting of a video visit, we may have to convert it to a phone-only visit.  In either situation, we cannot ensure that we have a secure connection.  Are you willing to proceed?" STAFF: Did the patient verbally acknowledge consent to telehealth visit? Document  YES/NO here: yes  4. Advise patient to be prepared - "Two hours prior to your appointment, go ahead and check your blood pressure, pulse, oxygen saturation, and your weight (if you have the equipment to check those) and write them all down. When your visit starts, your provider will ask you for this information. If you have an Apple Watch or Kardia device, please plan to have heart rate information ready on the day of your appointment. Please have a pen and paper handy nearby the day of the visit as well."  5. Give patient instructions for MyChart download to smartphone OR Doximity/Doxy.me as below if video visit (depending on what platform provider is using)  6. Inform patient they will receive a phone call 15 minutes prior to their appointment time (may be from unknown caller ID) so they should be prepared to answer    TELEPHONE CALL NOTE  Loretta Thomas has been deemed a candidate for a follow-up tele-health visit to limit community exposure during the Covid-19 pandemic. I spoke with the patient via phone to ensure availability of phone/video source, confirm preferred email & phone number, and discuss instructions and expectations.  I reminded Loretta Thomas to be prepared with any vital sign and/or heart rhythm information that could potentially be obtained via home monitoring, at the time of her visit. I reminded Loretta Thomas to expect a phone call prior to her visit.  Loretta Thomas 03/09/2019 9:20 AM   INSTRUCTIONS FOR DOWNLOADING THE MYCHART APP TO SMARTPHONE  - The patient must first make sure to have activated MyChart and know their login information - If Apple, go to [employer] and type in MyChart in the search bar and download the app. If Android, ask patient to go to [employer] and type in [place] in the search bar and download the app. The app is free but as with any other app downloads, their phone may require them to verify saved payment information or Apple/Android  password.  - The patient will need to then log into the app with their MyChart username and password, and select [name] as their healthcare provider to link the account. When it is time for your visit, go to the MyChart app, find appointments, and click Begin Video Visit. Be sure to Select Allow for your device to access the Microphone and Camera for your visit. You will then be connected, and your provider will be with you shortly.  **If they have any issues connecting, or need assistance please contact MyChart service desk [phone])**  **If using a computer, in order to ensure the best quality for their visit they will need to use either of the following Internet Browsers: [employer], or Google Chrome**  IF USING DOXIMITY or DOXY.ME - The patient will receive a link just prior to their visit by text.     FULL LENGTH CONSENT FOR TELE-HEALTH VISIT   I hereby voluntarily request, consent and authorize [facility] and its employed or contracted physicians, physician assistants, nurse practitioners or other licensed health care professionals (the Practitioner), to provide me with telemedicine health care services (the Services") as deemed necessary by the treating Practitioner. I acknowledge and consent to receive the Services by the Practitioner via telemedicine. I understand that the telemedicine visit will involve communicating with the Practitioner through live audiovisual communication technology and the disclosure of certain medical information by electronic transmission. I acknowledge that I have been given the opportunity to request an in-person assessment or other available alternative prior to the telemedicine visit and am voluntarily participating in the telemedicine visit.  I understand that I have the right to withhold or withdraw my consent to the use of telemedicine in the course of my care at any time, without affecting my right to future care or treatment,  and that the Practitioner or I may terminate the telemedicine visit at any time. I understand that I have the right to inspect all information obtained and/or recorded in the course of the telemedicine visit and may receive copies of available information for a reasonable fee.  I understand that some of the potential risks of receiving the Services via telemedicine include:   Delay or interruption in medical evaluation due to technological equipment failure or disruption;  Information transmitted may not be sufficient (e.g. poor resolution of images) to allow for appropriate medical decision making by the Practitioner; and/or   In rare instances, security protocols could fail, causing a breach of personal health information.  Furthermore, I acknowledge that it is my responsibility to provide information about my medical history, conditions and care that is complete and accurate to the best of my ability. I acknowledge that Practitioner's advice, recommendations, and/or decision may be based on factors not within their control, such as incomplete or inaccurate data provided by me or distortions of diagnostic images or specimens that may result from electronic transmissions. I understand that the  practice of medicine is not an Chief Strategy Officer and that Practitioner makes no warranties or guarantees regarding treatment outcomes. I acknowledge that I will receive a copy of this consent concurrently upon execution via email to the email address I last provided but may also request a printed copy by calling the office of [facility].    I understand that my insurance will be billed for this visit.   I have read or had this consent read to me.  I understand the contents of this consent, which adequately explains the benefits and risks of the Services being provided via telemedicine.   I have been provided ample opportunity to ask questions regarding this consent and the Services and have had my questions  answered to my satisfaction.  I give my informed consent for the services to be provided through the use of telemedicine in my medical care  By participating in this telemedicine visit I agree to the above.

## 2019-03-10 NOTE — Progress Notes (Signed)
EPIC Encounter for ICM Monitoring  Patient Name: Loretta Thomas is a 68 y.o. female Date: 03/10/2019 Primary Care Physican: Rossie Muskrat, DO Primary Cardiologist:Koneswaran Electrophysiologist:Allred Bi-V Pacing:>99% 11/24/2020Weight:181 lbs  Spoke with patient.  She was exposed to COVID and 14 days of quarantine is up tomorrow.   She thinks she has some sinus congestion.  Corvue thoracic impedancenormal butsuggestedpossible fluid accumulation since 01/28/2019 -01/31/2019.  Prescribed:Furosemide40 mg take 1 tablet daily. Potassium 20 mEq 1 tablet daily  Labs: 01/04/2018 Creatinine0.69, BUN10, Potassium4.0, Sodium141, GFR>60  12/30/2017 Creatinine0.81, BUN12, Potassium4.1, Sodium143, [id number] A complete set of results can be found in Results Review.  Recommendations:  No changes and encouraged to call if experiencing any fluid symptoms.  Follow-up plan: ICM clinic phone appointment on 04/20/2019.   91 day device clinic remote transmission 04/07/2019.  Office appt 03/20/2019 with Dr. Rayann Heman.    Copy of ICM check sent to Dr. Rayann Heman.   3 month ICM trend: 03/09/2019    1 Year ICM trend:       [name], RN 03/10/2019 10:27 AM

## 2019-03-20 ENCOUNTER — Telehealth (INDEPENDENT_AMBULATORY_CARE_PROVIDER_SITE_OTHER): Payer: Federal, State, Local not specified - PPO | Admitting: Internal Medicine

## 2019-03-20 DIAGNOSIS — I5022 Chronic systolic (congestive) heart failure: Secondary | ICD-10-CM

## 2019-03-20 DIAGNOSIS — I5042 Chronic combined systolic (congestive) and diastolic (congestive) heart failure: Secondary | ICD-10-CM

## 2019-03-20 DIAGNOSIS — Z95810 Presence of automatic (implantable) cardiac defibrillator: Secondary | ICD-10-CM | POA: Diagnosis not present

## 2019-03-20 NOTE — Progress Notes (Signed)
Electrophysiology TeleHealth Note   Due to national recommendations of social distancing due to [place] 19, an audio telehealth visit is felt to be most appropriate for this patient at this time.  Verbal consent was obtained by me for the telehealth visit today.  The patient does not have capability for a virtual visit.  A phone visit is therefore required today.   Date:  03/20/2019   ID:  Loretta Thomas, DOB 03-20-1951, MRN [id number]  Location: patient's home  Provider location:  [facility]  Evaluation Performed: Follow-up visit  PCP:  Rossie Muskrat, DO   Electrophysiologist:  Dr Rayann Heman  Chief Complaint:  ICD follow up  History of Present Illness:    Loretta Thomas is a 68 y.o. female who presents via telehealth conferencing today.  Since last being seen in our clinic, the patient reports doing relatively well.   She has been struggling for a few weeks with sinus infection. COVID test was negative.  Today, she denies symptoms of palpitations, chest pain, shortness of breath (above baseline),  lower extremity edema, dizziness, presyncope, or syncope.  The patient is otherwise without complaint today.  The patient denies symptoms of fevers, chills, cough, or new SOB worrisome for COVID 19.  Past Medical History:  Diagnosis Date  . Anxiety   . Bladder cystocele   . Breast cancer ([place])    s/p L breast cancer removal  . Bundle branch block, left    diagnosed 03/2012 followed by cath showing only minimal CAD  . Bursitis   . Cancer ([place])    right breast  . Capsulitis   . Diverticulosis   . Elevated liver enzymes 2007  . History of IBS   . Hyperlipidemia   . Hypertension   . Nonischemic cardiomyopathy ([place])   . pernicious anemia     Past Surgical History:  Procedure Laterality Date  . ANKLE FRACTURE SURGERY    . BIV ICD INSERTION CRT-D N/A 01/03/2018    [facility] Assura MP model [phone] (serial  Number [id number]) biventricular ICD for primary  prevention of sudden death  . BREAST BIOPSY     right breast  . BREAST RECONSTRUCTION WITH PLACEMENT OF TISSUE EXPANDER AND FLEX HD (ACELLULAR HYDRATED DERMIS) Right 01/28/2014   Procedure: RIGHT BREAST RECONSTRUCTION WITH PLACEMENT OF TISSUE EXPANDER;  Surgeon: Crissie Reese, MD;  Location: [place];  Service: Plastics;  Laterality: Right;  . CARDIAC CATHETERIZATION     x3; 03/18/12 [facility]): 10-20% pLAD, <20% ostial LCX, 20% mPLA, EF 60%.    . CHOLECYSTECTOMY    . COLONOSCOPY     x4  . DILATION AND CURETTAGE OF UTERUS    . MASTECTOMY Right    malignant  . REDUCTION MAMMAPLASTY Left   . RHINOPLASTY    . SIMPLE MASTECTOMY WITH AXILLARY SENTINEL NODE BIOPSY Right 01/28/2014   Procedure: RIGHT TOTAL MASTECTOMY WITH  RIGHT AXILLARY SENTINEL NODE BIOPSY;  Surgeon: Excell Seltzer, MD;  Location: [place];  Service: General;  Laterality: Right;  . TONSILLECTOMY AND ADENOIDECTOMY    . TUBAL LIGATION      Current Outpatient Medications  Medication Sig Dispense Refill  . acetaminophen (TYLENOL) 500 MG tablet Take 500 mg by mouth every 6 (six) hours as needed.    . ALPRAZolam (XANAX) 0.5 MG tablet Take 0.5 mg by mouth every 8 (eight) hours as needed. for pain    . Calcium Carbonate-Vitamin D 600-400 MG-UNIT tablet Take 1 tablet by mouth 2 (two)  times daily.    . carvedilol (COREG) 3.125 MG tablet TAKE 1 TABLET (3.125 MG TOTAL) BY MOUTH 2 (TWO) TIMES DAILY WITH A MEAL. 60 tablet 6  . clemastine (TAVIST) 2.68 MG TABS tablet Take 2.68 mg by mouth 2 (two) times daily.    . cyanocobalamin (,VITAMIN B-12,) 1000 MCG/ML injection Inject 1 mL (1,000 mcg total) into the muscle every 30 (thirty) days. 10 mL 0  . famotidine (PEPCID) 40 MG tablet Take 40 mg by mouth daily as needed.   6  . fluconazole (DIFLUCAN) 150 MG tablet Take 1 tablet by mouth every three (3) days as needed.    . furosemide (LASIX) 40 MG tablet Take 1 tablet (40 mg total) by mouth daily. 90 tablet 1  . letrozole  (FEMARA) 2.5 MG tablet Take 1 tablet (2.5 mg total) by mouth daily. 90 tablet 3  . loratadine (CLARITIN) 10 MG tablet Take 10 mg by mouth daily.    Marland Kitchen losartan (COZAAR) 50 MG tablet Take 1 tablet (50 mg total) by mouth daily. 90 tablet 0  . potassium chloride SA (KLOR-CON) 20 MEQ tablet Take 1 tablet (20 mEq total) by mouth daily. 90 tablet 0  . triamcinolone cream (KENALOG) 0.1 % Apply 1 application topically 2 (two) times daily as needed for itching.    . venlafaxine XR (EFFEXOR-XR) 37.5 MG 24 hr capsule Take 1 capsule (37.5 mg total) by mouth daily with breakfast. 90 capsule 0  . Vitamin D, Ergocalciferol, (DRISDOL) 50000 UNITS CAPS capsule Take 50,000 Units by mouth every 7 (seven) days. Fridays    . zolpidem (AMBIEN) 10 MG tablet Take 10 mg by mouth at bedtime.     No current facility-administered medications for this visit.     Allergies:   Codeine, Demerol [meperidine], Hydrocodone, and Lortab [hydrocodone-acetaminophen]   Social History:  The patient  reports that she has never smoked. She has never used smokeless tobacco. She reports that she does not drink alcohol or use drugs.   Family History:  The patient's  family history includes Breast cancer (age of onset: 41) in her cousin; Breast cancer (age of onset: 39) in her maternal aunt; Cancer in her brother and sister; Cancer (age of onset: 57) in her cousin; Cancer (age of onset: 67) in her maternal aunt; Cancer (age of onset: 42) in her maternal grandmother; Cancer (age of onset: 75) in her paternal grandfather; Cancer (age of onset: 59) in her father.   ROS:  Please see the history of present illness.   All other systems are personally reviewed and negative.    Exam:    Vital Signs:  There were no vitals taken for this visit.  Well sounding and appearing, alert and conversant, regular work of breathing,  good skin color Eyes- anicteric, neuro- grossly intact, skin- no apparent rash or lesions or cyanosis, mouth- oral mucosa is  pink  Labs/Other Tests and Data Reviewed:    Recent Labs: No results found for requested labs within last 8760 hours.   Wt Readings from Last 3 Encounters:  01/09/19 178 lb 3.2 oz (80.8 kg)  05/27/18 181 lb (82.1 kg)  05/12/18 176 lb (79.8 kg)     Last device remote is reviewed from [place] PDF which reveals normal device function    ASSESSMENT & PLAN:    1.  Chronic systolic heart failure EF remains depressed post CRT implant Normal device function, see [facility] report Continue follow up in ICM clinic She has some symptoms that may be  consistent with diaphragmatic stimulation, will evaluate at time of AV opt Will arrange for AV opt in [place] office in the next few weeks   Follow-up:  With EP NP in next few weeks for AV optimization echo    Patient Risk:  after full review of this patients clinical status, I feel that they are at moderate risk at this time.  Today, I have spent 15 minutes with the patient with telehealth technology discussing arrhythmia management .    Army Fossa, MD  03/20/2019 10:08 AM     [facility] [address] [place] [address] [phone] (office) [phone] (fax)

## 2019-03-24 ENCOUNTER — Other Ambulatory Visit: Payer: Self-pay

## 2019-03-24 DIAGNOSIS — I428 Other cardiomyopathies: Secondary | ICD-10-CM

## 2019-03-24 NOTE — Progress Notes (Signed)
AV op echo

## 2019-03-30 ENCOUNTER — Other Ambulatory Visit: Payer: Self-pay | Admitting: Hematology and Oncology

## 2019-03-30 DIAGNOSIS — Z1231 Encounter for screening mammogram for malignant neoplasm of breast: Secondary | ICD-10-CM

## 2019-04-01 ENCOUNTER — Telehealth: Payer: Self-pay | Admitting: Hematology and Oncology

## 2019-04-01 NOTE — Telephone Encounter (Signed)
[place] 1/27 f/u moved to 2/9. Confirmed with patient. Date per patient.

## 2019-04-01 NOTE — Telephone Encounter (Signed)
[place] 1/27 f/u moved to 2/9. Confirmed with patient. Date per patient.

## 2019-04-07 ENCOUNTER — Ambulatory Visit (INDEPENDENT_AMBULATORY_CARE_PROVIDER_SITE_OTHER): Payer: Federal, State, Local not specified - PPO | Admitting: Cardiovascular Disease

## 2019-04-07 ENCOUNTER — Ambulatory Visit (INDEPENDENT_AMBULATORY_CARE_PROVIDER_SITE_OTHER): Payer: Federal, State, Local not specified - PPO | Admitting: *Deleted

## 2019-04-07 ENCOUNTER — Other Ambulatory Visit: Payer: Self-pay

## 2019-04-07 ENCOUNTER — Encounter: Payer: Self-pay | Admitting: Cardiovascular Disease

## 2019-04-07 VITALS — BP 120/78 | HR 79 | Temp 97.9°F | Ht 67.0 in | Wt 184.0 lb

## 2019-04-07 DIAGNOSIS — Z95810 Presence of automatic (implantable) cardiac defibrillator: Secondary | ICD-10-CM

## 2019-04-07 DIAGNOSIS — I428 Other cardiomyopathies: Secondary | ICD-10-CM | POA: Diagnosis not present

## 2019-04-07 DIAGNOSIS — I10 Essential (primary) hypertension: Secondary | ICD-10-CM

## 2019-04-07 DIAGNOSIS — I5042 Chronic combined systolic (congestive) and diastolic (congestive) heart failure: Secondary | ICD-10-CM

## 2019-04-07 LAB — CUP PACEART REMOTE DEVICE CHECK
Battery Remaining Longevity: 77 mo
Battery Remaining Percentage: 82 %
Battery Voltage: 3.01 V
Brady Statistic AP VP Percent: 2.3 %
Brady Statistic AP VS Percent: 1 %
Brady Statistic AS VP Percent: 98 %
Brady Statistic AS VS Percent: 1 %
Brady Statistic RA Percent Paced: 2.3 %
Date Time Interrogation Session: 20201222020017
HighPow Impedance: 80 Ohm
HighPow Impedance: 80 Ohm
Implantable Lead Implant Date: 20190920
Implantable Lead Implant Date: 20190920
Implantable Lead Implant Date: 20190920
Implantable Lead Location: 753858
Implantable Lead Location: 753859
Implantable Lead Location: 753860
Implantable Pulse Generator Implant Date: 20190920
Lead Channel Impedance Value: 1200 Ohm
Lead Channel Impedance Value: 450 Ohm
Lead Channel Impedance Value: 610 Ohm
Lead Channel Pacing Threshold Amplitude: 0.5 V
Lead Channel Pacing Threshold Amplitude: 0.625 V
Lead Channel Pacing Threshold Amplitude: 1.125 V
Lead Channel Pacing Threshold Pulse Width: 0.5 ms
Lead Channel Pacing Threshold Pulse Width: 0.5 ms
Lead Channel Pacing Threshold Pulse Width: 0.5 ms
Lead Channel Sensing Intrinsic Amplitude: 11.8 mV
Lead Channel Sensing Intrinsic Amplitude: 5 mV
Lead Channel Setting Pacing Amplitude: 2 V
Lead Channel Setting Pacing Amplitude: 2 V
Lead Channel Setting Pacing Amplitude: 2.125
Lead Channel Setting Pacing Pulse Width: 0.5 ms
Lead Channel Setting Pacing Pulse Width: 0.5 ms
Lead Channel Setting Sensing Sensitivity: 0.5 mV
Pulse Gen Serial Number: 9833194

## 2019-04-07 NOTE — Patient Instructions (Signed)
Medication Instructions:  Your physician recommends that you continue on your current medications as directed. Please refer to the Current Medication list given to you today.  *If you need a refill on your cardiac medications before your next appointment, please call your pharmacy*  Lab Work: None today If you have labs (blood work) drawn today and your tests are completely normal, you will receive your results only by: . MyChart Message (if you have MyChart) OR . A paper copy in the mail If you have any lab test that is abnormal or we need to change your treatment, we will call you to review the results.  Testing/Procedures: None today  Follow-Up: At [facility], you and your health needs are our priority.  As part of our continuing mission to provide you with exceptional heart care, we have created designated Provider Care Teams.  These Care Teams include your primary Cardiologist (physician) and Advanced Practice Providers (APPs -  Physician Assistants and Nurse Practitioners) who all work together to provide you with the care you need, when you need it.  Your next appointment:   6 month(s)  The format for your next appointment:   Virtual Visit   Provider:   Suresh Koneswaran, MD  Other Instructions None     Thank you for choosing [facility] !         

## 2019-04-07 NOTE — Progress Notes (Signed)
SUBJECTIVE: The patient presents for follow-up of chronic combined heart failure.  Echocardiogram on 05/21/2018 showed severely reduced left ventricular systolic function, LVEF [id number]. There was mild left ventricular dilatation, diastolic dysfunction, and elevated left ventricular filling pressures.  She underwent biventricular ICD implantation in September 2019.  She had mild nonobstructive coronary disease by coronary angiography in December 2013 in [place], [place].  Chronic exertional dyspnea stable.    She has alpha gal syndrome and has multiple food allergies.  ECG performed today which I personally reviewed demonstrates a ventricular paced rhythm.  She said she was tested about a month and a half ago for Covid and was found to be negative.  She was having some upper respiratory issues and sinus problems.  She was given steroids.  She had increased phlegm production.  She denies fevers.  She currently denies leg swelling and proximal nocturnal dyspnea.  She sleeps propped up in a love seat.  She took an extra 20 mg of Lasix 2 weeks ago and felt better.  She thinks her hands feel a little tight.   Review of Systems: As per "subjective", otherwise negative.  Allergies  Allergen Reactions  . Codeine     Stomach Pains  . Demerol [Meperidine]     Upset stomach  . Hydrocodone   . Lortab [Hydrocodone-Acetaminophen]     Upset stomach    Current Outpatient Medications  Medication Sig Dispense Refill  . acetaminophen (TYLENOL) 500 MG tablet Take 500 mg by mouth every 6 (six) hours as needed.    . ALPRAZolam (XANAX) 0.5 MG tablet Take 0.5 mg by mouth every 8 (eight) hours as needed. for pain    . Calcium Carbonate-Vitamin D 600-400 MG-UNIT tablet Take 1 tablet by mouth 2 (two) times daily.    . carvedilol (COREG) 3.125 MG tablet TAKE 1 TABLET (3.125 MG TOTAL) BY MOUTH 2 (TWO) TIMES DAILY WITH A MEAL. 60 tablet 6  . clemastine (TAVIST) 2.68 MG TABS tablet Take 2.68  mg by mouth 2 (two) times daily.    . cyanocobalamin (,VITAMIN B-12,) 1000 MCG/ML injection Inject 1 mL (1,000 mcg total) into the muscle every 30 (thirty) days. 10 mL 0  . famotidine (PEPCID) 40 MG tablet Take 40 mg by mouth daily as needed.   6  . fluconazole (DIFLUCAN) 150 MG tablet Take 1 tablet by mouth every three (3) days as needed.    . furosemide (LASIX) 40 MG tablet Take 1 tablet (40 mg total) by mouth daily. 90 tablet 1  . letrozole (FEMARA) 2.5 MG tablet Take 1 tablet (2.5 mg total) by mouth daily. 90 tablet 3  . loratadine (CLARITIN) 10 MG tablet Take 10 mg by mouth daily.    Marland Kitchen losartan (COZAAR) 50 MG tablet Take 1 tablet (50 mg total) by mouth daily. 90 tablet 0  . potassium chloride SA (KLOR-CON) 20 MEQ tablet Take 1 tablet (20 mEq total) by mouth daily. 90 tablet 0  . triamcinolone cream (KENALOG) 0.1 % Apply 1 application topically 2 (two) times daily as needed for itching.    . venlafaxine XR (EFFEXOR-XR) 37.5 MG 24 hr capsule Take 1 capsule (37.5 mg total) by mouth daily with breakfast. 90 capsule 0  . Vitamin D, Ergocalciferol, (DRISDOL) 50000 UNITS CAPS capsule Take 50,000 Units by mouth every 7 (seven) days. Fridays    . zolpidem (AMBIEN) 10 MG tablet Take 10 mg by mouth at bedtime.     No current facility-administered medications for this visit.  Past Medical History:  Diagnosis Date  . Anxiety   . Bladder cystocele   . Breast cancer ([place])    s/p L breast cancer removal  . Bundle branch block, left    diagnosed 03/2012 followed by cath showing only minimal CAD  . Bursitis   . Cancer ([place])    right breast  . Capsulitis   . Diverticulosis   . Elevated liver enzymes 2007  . History of IBS   . Hyperlipidemia   . Hypertension   . Nonischemic cardiomyopathy ([place])   . pernicious anemia     Past Surgical History:  Procedure Laterality Date  . ANKLE FRACTURE SURGERY    . BIV ICD INSERTION CRT-D N/A 01/03/2018    [facility] Assura MP model  [phone] (serial  Number [id number]) biventricular ICD for primary prevention of sudden death  . BREAST BIOPSY     right breast  . BREAST RECONSTRUCTION WITH PLACEMENT OF TISSUE EXPANDER AND FLEX HD (ACELLULAR HYDRATED DERMIS) Right 01/28/2014   Procedure: RIGHT BREAST RECONSTRUCTION WITH PLACEMENT OF TISSUE EXPANDER;  Surgeon: Crissie Reese, MD;  Location: [place];  Service: Plastics;  Laterality: Right;  . CARDIAC CATHETERIZATION     x3; 03/18/12 [facility] [place]): 10-20% pLAD, <20% ostial LCX, 20% mPLA, EF 60%.    . CHOLECYSTECTOMY    . COLONOSCOPY     x4  . DILATION AND CURETTAGE OF UTERUS    . MASTECTOMY Right    malignant  . REDUCTION MAMMAPLASTY Left   . RHINOPLASTY    . SIMPLE MASTECTOMY WITH AXILLARY SENTINEL NODE BIOPSY Right 01/28/2014   Procedure: RIGHT TOTAL MASTECTOMY WITH  RIGHT AXILLARY SENTINEL NODE BIOPSY;  Surgeon: Excell Seltzer, MD;  Location: [place];  Service: General;  Laterality: Right;  . TONSILLECTOMY AND ADENOIDECTOMY    . TUBAL LIGATION      Social History   Socioeconomic History  . Marital status: Married    Spouse name: Not on file  . Number of children: Not on file  . Years of education: Not on file  . Highest education level: Not on file  Occupational History  . Not on file  Tobacco Use  . Smoking status: Never Smoker  . Smokeless tobacco: Never Used  Substance and Sexual Activity  . Alcohol use: No  . Drug use: No  . Sexual activity: Yes  Other Topics Concern  . Not on file  Social History Narrative   Lives in [place] Determinants of Health   Financial Resource Strain:   . Difficulty of Paying Living Expenses: Not on file  Food Insecurity:   . Worried About Charity fundraiser in the Last Year: Not on file  . Ran Out of Food in the Last Year: Not on file  Transportation Needs:   . Lack of Transportation (Medical): Not on file  . Lack of Transportation (Non-Medical): Not on file  Physical Activity:   . Days  of Exercise per Week: Not on file  . Minutes of Exercise per Session: Not on file  Stress:   . Feeling of Stress : Not on file  Social Connections:   . Frequency of Communication with Friends and Family: Not on file  . Frequency of Social Gatherings with Friends and Family: Not on file  . Attends Religious Services: Not on file  . Active Member of Clubs or Organizations: Not on file  . Attends Archivist Meetings: Not on file  . Marital  Status: Not on file  Intimate Partner Violence:   . Fear of Current or Ex-Partner: Not on file  . Emotionally Abused: Not on file  . Physically Abused: Not on file  . Sexually Abused: Not on file     Vitals:   04/07/19 1304  BP: 120/78  Pulse: 79  Weight: 184 lb (83.5 kg)  Height: 5\' 7"  (1.702 m)    Wt Readings from Last 3 Encounters:  04/07/19 184 lb (83.5 kg)  01/09/19 178 lb 3.2 oz (80.8 kg)  05/27/18 181 lb (82.1 kg)     PHYSICAL EXAM General: NAD HEENT: Normal. Neck: No JVD, no thyromegaly. Lungs: Clear to auscultation bilaterally with normal respiratory effort. CV: Regular rate and rhythm, normal S1/S2, no S3/S4, no murmur. No pretibial or periankle edema.  No carotid bruit.   Abdomen: Soft, nontender, no distention.  Neurologic: Alert and oriented.  Psych: Normal affect. Skin: Normal. Musculoskeletal: No gross deformities.      Labs: Lab Results  Component Value Date/Time   K 4.0 01/04/2018 04:24 AM   K 4.2 06/21/2014 10:35 AM   BUN 10 01/04/2018 04:24 AM   BUN 12 12/30/2017 09:57 AM   BUN 9.7 06/21/2014 10:35 AM   CREATININE 0.69 01/04/2018 04:24 AM   CREATININE 0.8 06/21/2014 10:35 AM   ALT <6 06/21/2014 10:35 AM   TSH 2.125 06/21/2014 10:35 AM   HGB 12.3 12/30/2017 09:57 AM   HGB 12.0 06/21/2014 10:35 AM     Lipids: Lab Results  Component Value Date/Time   LDLCALC 77 06/21/2014 10:35 AM   CHOL 153 06/21/2014 10:35 AM   TRIG 150 (H) 06/21/2014 10:35 AM   HDL 46 06/21/2014 10:35 AM        ASSESSMENT AND PLAN:  1. Chronic combined heart failure: LVEF remains at 20% by echocardiogram on 05/21/2018. She developed a rash with Entresto. She remains on carvedilol 3.125 mg twice daily, Lasix 40 mg daily along with supplemental potassium, and losartan 50 mg daily. She appears euvolemic and has NYHA class II symptoms. No changesto therapy.    2. Biventricular ICD: Implantation in September 2019. LVEF remains severely reduced at 20% by echocardiogram on 05/21/2018.Denies ICD shocks.  She has been scheduled for AV optimization echocardiogram by EP.  3. Hypertension: Blood pressure is normal. No changes to therapy.   Disposition: Follow up 6 months virtual visit   Kate Sable, M.D., F.A.C.C.

## 2019-04-16 ENCOUNTER — Other Ambulatory Visit: Payer: Self-pay | Admitting: *Deleted

## 2019-04-16 MED ORDER — LOSARTAN POTASSIUM 50 MG PO TABS: 50.0000 mg | ORAL_TABLET | Freq: Every day | ORAL | 1 refills | Status: DC

## 2019-04-28 ENCOUNTER — Ambulatory Visit (INDEPENDENT_AMBULATORY_CARE_PROVIDER_SITE_OTHER): Payer: Federal, State, Local not specified - PPO

## 2019-04-28 DIAGNOSIS — Z95810 Presence of automatic (implantable) cardiac defibrillator: Secondary | ICD-10-CM

## 2019-04-28 DIAGNOSIS — I5042 Chronic combined systolic (congestive) and diastolic (congestive) heart failure: Secondary | ICD-10-CM | POA: Diagnosis not present

## 2019-05-01 ENCOUNTER — Telehealth: Payer: Self-pay

## 2019-05-01 NOTE — Telephone Encounter (Signed)
Remote ICM transmission received.  Attempted call to patient regarding ICM remote transmission and no answer.  

## 2019-05-01 NOTE — Progress Notes (Signed)
EPIC Encounter for ICM Monitoring  Patient Name: Loretta Thomas is a 69 y.o. female Date: 05/01/2019 Primary Care Physican: Rossie Muskrat, DO Primary Cardiologist:Koneswaran Electrophysiologist:Allred Bi-V Pacing:>99% 11/24/2020Weight:181 lbs  Attempted call to patient and unable to reach. Transmission reviewed.   [place] thoracic impedancenormal.  Prescribed:Furosemide40 mg take 1 tablet daily. Potassium 20 mEq 1 tablet daily  Labs: 01/04/2018 Creatinine0.69, BUN10, Potassium4.0, Sodium141, GFR>60  12/30/2017 Creatinine0.81, BUN12, Potassium4.1, Sodium143, [id number] A complete set of results can be found in Results Review.  Recommendations: Unable to reach.    Follow-up plan: ICM clinic phone appointment on 06/01/2019.   91 day device clinic remote transmission 07/07/2019.  Office appt 05/07/2019 with Chanetta Marshall, NP.    Copy of ICM check sent to Dr. Rayann Heman.   3 month ICM trend: 04/28/2019    1 Year ICM trend:       [name], RN 05/01/2019 3:34 PM

## 2019-05-01 NOTE — Progress Notes (Signed)
Spoke with patient and transmission reviewed.  She took extra Furosemide during decreased impedance and is feeling fine at this time.  No changes and encouraged to call if experiencing any fluid symptoms.

## 2019-05-07 ENCOUNTER — Ambulatory Visit (HOSPITAL_COMMUNITY): Payer: Federal, State, Local not specified - PPO | Attending: Internal Medicine

## 2019-05-07 ENCOUNTER — Ambulatory Visit (INDEPENDENT_AMBULATORY_CARE_PROVIDER_SITE_OTHER): Payer: Federal, State, Local not specified - PPO | Admitting: Nurse Practitioner

## 2019-05-07 ENCOUNTER — Other Ambulatory Visit: Payer: Self-pay

## 2019-05-07 DIAGNOSIS — I5042 Chronic combined systolic (congestive) and diastolic (congestive) heart failure: Secondary | ICD-10-CM | POA: Diagnosis not present

## 2019-05-07 DIAGNOSIS — I428 Other cardiomyopathies: Secondary | ICD-10-CM | POA: Diagnosis present

## 2019-05-07 NOTE — Progress Notes (Signed)
    AV Optimization Echo Note Date: 05/07/2019  ID:  Loretta Thomas, DOB 07-04-1950, MRN [id number]  PCP: Rossie Muskrat, DO Primary Cardiologist: Bronson Ing Electrophysiologist: Loretta Thomas  Loretta Thomas is a 69 y.o. female is seen today for Dr Loretta Thomas.  She presents today for AV optimization of CRT.  Since last being seen in our clinic, the patient reports ongoing fatigue and exercise intolerance  The patient is predominately atrially sensed.  Device was programmed with a  PAV of 140 and SAV of 100 on presentation.  Sensed AV delays were evaluated from 80 msec to 160 msec.  Optimal separation of E/A waves was noted at 100 msec.    Device was reprogrammed today to a SAV of 149msec and a PAV of 170msec.  LV first was changed from 70msec to 57msec.   Follow up with Dr Loretta Thomas in 6 weeks with virtual visit.   Signed, Chanetta Marshall, NP  05/07/2019 6:29 AM     [place] [address] [phone] (office) [phone] (fax

## 2019-05-08 ENCOUNTER — Other Ambulatory Visit: Payer: Self-pay | Admitting: *Deleted

## 2019-05-08 LAB — CUP PACEART INCLINIC DEVICE CHECK
Date Time Interrogation Session: 20210121134447
Implantable Lead Implant Date: 20190920
Implantable Lead Implant Date: 20190920
Implantable Lead Implant Date: 20190920
Implantable Lead Location: 753858
Implantable Lead Location: 753859
Implantable Lead Location: 753860
Implantable Pulse Generator Implant Date: 20190920
Pulse Gen Serial Number: 9833194

## 2019-05-08 MED ORDER — LOSARTAN POTASSIUM 50 MG PO TABS: 50.0000 mg | ORAL_TABLET | Freq: Every day | ORAL | 2 refills | Status: DC

## 2019-05-13 ENCOUNTER — Ambulatory Visit: Payer: BLUE CROSS/BLUE SHIELD | Admitting: Hematology and Oncology

## 2019-05-18 ENCOUNTER — Other Ambulatory Visit: Payer: Self-pay

## 2019-05-18 ENCOUNTER — Ambulatory Visit
Admission: RE | Admit: 2019-05-18 | Discharge: 2019-05-18 | Disposition: A | Discharge status: Home / Self Care | Payer: Federal, State, Local not specified - PPO | Source: Ambulatory Visit | Attending: Hematology and Oncology | Admitting: Hematology and Oncology

## 2019-05-18 DIAGNOSIS — Z1231 Encounter for screening mammogram for malignant neoplasm of breast: Secondary | ICD-10-CM

## 2019-05-25 NOTE — Progress Notes (Signed)
Patient Care Team: Rossie Muskrat, DO as PCP - General (Family Medicine) Herminio Commons, MD as PCP - Cardiology (Cardiology) Thompson Grayer, MD as PCP - Electrophysiology (Cardiology) Rossie Muskrat, DO as Referring Physician (Family Medicine)  DIAGNOSIS:    ICD-10-CM   1. Malignant neoplasm of upper-outer quadrant of right breast in female, estrogen receptor positive ([place])  C50.411    Z17.0     SUMMARY OF ONCOLOGIC HISTORY: Oncology History  Breast cancer of upper-outer quadrant of right female breast ()  10/19/2013 Mammogram   outer right breast, middle third, demonstrate a 6 x 6 x 12 mm group of heterogeneous calcifications   11/10/2013 Breast MRI   nonmacerated enhancement in the retroareolar region extending to the nipple with the area of enhancement measuring 4.3 x 2.5 x 3.0 cm   11/12/2013 Initial Diagnosis   Right mastectomy: Invasive ductal carcinoma with DCIS with comedo-type necrosis and calcifications in ER 99% PR 30% Ki-67 40% HER-2 negative ratio 1.14; Grade 3; second biopsy from subareolar area done 11/16/2013 revealed DCIS with calcifications   12/29/2013 Procedure   BRCA 1 and 2 Negative: 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 with above-mentioned history of right breast cancer treated with mastectomy and who is currently on antiestrogen therapy with letrozole. Mammogram on 05/19/19 showed no evidence of malignancy bilaterally. She presents to the clinic today for  follow-up.  She had a pacemaker implantation last year.  Because of this we cannot do breast MRIs anymore. She denies any pain lumps or nodules in the breast.  ALLERGIES:  is allergic to codeine; demerol [meperidine]; hydrocodone; and lortab [hydrocodone-acetaminophen].  MEDICATIONS:  Current Outpatient Medications  Medication Sig Dispense Refill  . acetaminophen (TYLENOL) 500 MG tablet Take 500 mg by mouth every 6 (six) hours as needed.    . ALPRAZolam (XANAX) 0.5 MG tablet Take 0.5 mg by mouth every 8 (eight) hours as needed. for pain    . Calcium Carbonate-Vitamin D 600-400 MG-UNIT tablet Take 1 tablet by mouth 2 (two) times daily.    . carvedilol (COREG) 3.125 MG tablet TAKE 1 TABLET (3.125 MG TOTAL) BY MOUTH 2 (TWO) TIMES DAILY WITH A MEAL. 60 tablet 6  . clemastine (TAVIST) 2.68 MG TABS tablet Take 2.68 mg by mouth 2 (two) times daily.    . cyanocobalamin (,VITAMIN B-12,) 1000 MCG/ML injection Inject 1 mL (1,000 mcg total) into the muscle every 30 (thirty) days. 10 mL 0  . famotidine (PEPCID) 40 MG tablet Take 40 mg by mouth daily as needed.   6  . fluconazole (DIFLUCAN) 150 MG tablet Take 1 tablet by mouth every three (3) days as needed.    . furosemide (LASIX) 40 MG tablet Take 1 tablet (40 mg total) by mouth daily. 90 tablet 1  . letrozole (FEMARA) 2.5 MG tablet Take 1 tablet (2.5 mg total) by mouth daily. 90 tablet 3  . loratadine (CLARITIN) 10 MG tablet Take 10 mg by mouth daily.    Marland Kitchen losartan (COZAAR) 50 MG tablet Take 1 tablet (50 mg  total) by mouth daily. 90 tablet 2  . potassium chloride SA (KLOR-CON) 20 MEQ tablet Take 1 tablet (20 mEq total) by mouth daily. 90 tablet 0  . triamcinolone cream (KENALOG) 0.1 % Apply 1 application topically 2 (two) times daily as needed for itching.    . venlafaxine XR (EFFEXOR-XR) 37.5 MG 24 hr capsule Take 1 capsule (37.5 mg total) by mouth daily with breakfast. 90 capsule 0  . Vitamin D, Ergocalciferol, (DRISDOL) 50000 UNITS CAPS capsule Take  50,000 Units by mouth every 7 (seven) days. Fridays    . zolpidem (AMBIEN) 10 MG tablet Take 10 mg by mouth at bedtime.     No current facility-administered medications for this visit.    PHYSICAL EXAMINATION: ECOG PERFORMANCE STATUS: 1 - Symptomatic but completely ambulatory  Vitals:   05/26/19 1136  BP: 128/61  Pulse: 92  Resp: 17  Temp: 98.7 F (37.1 C)  SpO2: 98%   Filed Weights   05/26/19 1136  Weight: 185 lb 12.8 oz (84.3 kg)    BREAST: No palpable masses or nodules in either right or left breasts. No palpable axillary supraclavicular or infraclavicular adenopathy no breast tenderness or nipple discharge. (exam performed in the presence of a chaperone)  LABORATORY DATA:  I have reviewed the data as listed CMP Latest Ref Rng & Units 01/04/2018 12/30/2017 10/22/2017  Glucose 70 - 99 mg/dL 97 101(H) -  BUN 8 - 23 mg/dL 10 12 -  Creatinine 0.44 - 1.00 mg/dL 0.69 0.81 0.80  Sodium 135 - 145 mmol/L 141 143 -  Potassium 3.5 - 5.1 mmol/L 4.0 4.1 -  Chloride 98 - 111 mmol/L 107 101 -  CO2 22 - 32 mmol/L 25 27 -  Calcium 8.9 - 10.3 mg/dL 8.7(L) 9.8 -  Total Protein 6.4 - 8.3 g/dL - - -  Total Bilirubin 0.20 - 1.20 mg/dL - - -  Alkaline Phos 40 - 150 U/L - - -  AST 5 - 34 U/L - - -  ALT 0 - 55 U/L - - -    Lab Results  Component Value Date   WBC 6.5 12/30/2017   HGB 12.3 12/30/2017   HCT 35.9 12/30/2017   MCV 90 12/30/2017   PLT 212 12/30/2017   NEUTROABS 4.2 12/30/2017    ASSESSMENT & PLAN:  Breast cancer of upper-outer quadrant of right female breast ([place]) Right breast IDCwith high-grade DCIS with comedonecrosis ER/PR positive HER-2 negative Ki-67 was 40%.  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 She may consider taking Effexor every other day.  Breast Cancer Surveillance: 1. Mammogram  05/19/2019 benign. Breast Density Category B. 2.  Breast exam: 05/26/2019: Benign 3.  Breast MRI 10/22/2017: Benign, patient will need annual breast MRIs.  Congestive heart failure: Currently on therapy  Prior history of pernicious anemia on monthly B12 injections: B12 injections Return to clinic in 1 year for follow-up    No orders of the defined types were placed in this encounter.  The patient has a good understanding of the overall plan. she agrees  with it. she will call with any problems that may develop before the next visit here.  Total time spent: 20 mins including face to face time and time spent for planning, charting and coordination of care  Nicholas Lose, MD 05/26/2019  I, Cloyde Reams Dorshimer, am acting as scribe for Dr. Nicholas Lose.  I have reviewed the above documentation for accuracy and completeness, and I agree with the above.

## 2019-05-26 ENCOUNTER — Inpatient Hospital Stay
Payer: Federal, State, Local not specified - PPO | Attending: Hematology and Oncology | Admitting: Hematology and Oncology

## 2019-05-26 ENCOUNTER — Other Ambulatory Visit: Payer: Self-pay

## 2019-05-26 DIAGNOSIS — Z950 Presence of cardiac pacemaker: Secondary | ICD-10-CM | POA: Diagnosis not present

## 2019-05-26 DIAGNOSIS — C50411 Malignant neoplasm of upper-outer quadrant of right female breast: Secondary | ICD-10-CM | POA: Diagnosis present

## 2019-05-26 DIAGNOSIS — Z9011 Acquired absence of right breast and nipple: Secondary | ICD-10-CM | POA: Diagnosis not present

## 2019-05-26 DIAGNOSIS — I509 Heart failure, unspecified: Secondary | ICD-10-CM | POA: Diagnosis not present

## 2019-05-26 DIAGNOSIS — F329 Major depressive disorder, single episode, unspecified: Secondary | ICD-10-CM | POA: Diagnosis not present

## 2019-05-26 DIAGNOSIS — D510 Vitamin B12 deficiency anemia due to intrinsic factor deficiency: Secondary | ICD-10-CM | POA: Diagnosis not present

## 2019-05-26 DIAGNOSIS — Z170 Estrogen receptor positive status [ER+]: Secondary | ICD-10-CM

## 2019-05-26 NOTE — Assessment & Plan Note (Signed)
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 She may consider taking Effexor every other day.  Breast Cancer Surveillance: 1. Mammogram  05/19/2019 benign. Breast Density Category B. 2.  Breast exam: 05/26/2019: Benign 3.  Breast MRI 10/22/2017: Benign, patient will need annual breast MRIs.  Congestive heart failure: Currently on therapy  Prior history of pernicious anemia on monthly B12 injections: B12 injections Return to clinic in 1 year for follow-up

## 2019-05-27 ENCOUNTER — Telehealth: Payer: Self-pay | Admitting: Hematology and Oncology

## 2019-05-27 NOTE — Telephone Encounter (Signed)
I talk with patient regarding schedule  

## 2019-06-01 ENCOUNTER — Ambulatory Visit (INDEPENDENT_AMBULATORY_CARE_PROVIDER_SITE_OTHER): Payer: Federal, State, Local not specified - PPO

## 2019-06-01 DIAGNOSIS — Z95810 Presence of automatic (implantable) cardiac defibrillator: Secondary | ICD-10-CM

## 2019-06-01 DIAGNOSIS — I5042 Chronic combined systolic (congestive) and diastolic (congestive) heart failure: Secondary | ICD-10-CM

## 2019-06-05 NOTE — Progress Notes (Signed)
EPIC Encounter for ICM Monitoring  Patient Name: Tayonna Genin is a 69 y.o. female Date: 06/05/2019 Primary Care Physican: Rossie Muskrat, DO Primary Cardiologist:Koneswaran Electrophysiologist:Allred Bi-V Pacing:>99% 06/03/2019 Weight:181lbs  Spoke with patient and she denies fluid symptoms.  She reports having some tingling in middle of the chest and left arm pain.  Advised to call 911 or use local ER to be evaluated and reviewed heart attack symptoms.  She does not feel like the symptoms are related to heart attack.  Corvue thoracic impedancenormal.  Prescribed:Furosemide40 mg take 1 tablet daily. Potassium 20 mEq 1 tablet daily  Labs: 01/04/2018 Creatinine0.69, BUN10, Potassium4.0, Sodium141, GFR>60  12/30/2017 Creatinine0.81, BUN12, Potassium4.1, Sodium143, [id number] A complete set of results can be found in Results Review.  Recommendations:  No changes and encouraged to call if experiencing any fluid symptoms.  Follow-up plan: ICM clinic phone appointment on 07/08/2019.   91 day device clinic remote transmission 07/07/2019.   Copy of ICM check sent to Dr. Rayann Heman.   3 month ICM trend: 06/03/2019    1 Year ICM trend:       [name], RN 06/05/2019 4:05 PM

## 2019-06-10 ENCOUNTER — Other Ambulatory Visit: Payer: Self-pay | Admitting: *Deleted

## 2019-06-10 MED ORDER — POTASSIUM CHLORIDE CRYS ER 20 MEQ PO TBCR: 20.0000 meq | EXTENDED_RELEASE_TABLET | Freq: Every day | ORAL | 2 refills | Status: DC

## 2019-06-11 ENCOUNTER — Other Ambulatory Visit: Payer: Self-pay | Admitting: *Deleted

## 2019-06-11 DIAGNOSIS — C50411 Malignant neoplasm of upper-outer quadrant of right female breast: Secondary | ICD-10-CM

## 2019-06-11 MED ORDER — VENLAFAXINE HCL ER 37.5 MG PO CP24: 37.5000 mg | ORAL_CAPSULE | Freq: Every day | ORAL | 1 refills | Status: DC

## 2019-06-24 ENCOUNTER — Telehealth: Payer: Federal, State, Local not specified - PPO | Admitting: Internal Medicine

## 2019-07-01 ENCOUNTER — Telehealth (INDEPENDENT_AMBULATORY_CARE_PROVIDER_SITE_OTHER): Payer: Federal, State, Local not specified - PPO | Admitting: Internal Medicine

## 2019-07-01 ENCOUNTER — Other Ambulatory Visit: Payer: Self-pay

## 2019-07-01 ENCOUNTER — Telehealth: Payer: Self-pay | Admitting: *Deleted

## 2019-07-01 VITALS — BP 115/86 | HR 101 | Ht 67.0 in | Wt 186.8 lb

## 2019-07-01 DIAGNOSIS — I5042 Chronic combined systolic (congestive) and diastolic (congestive) heart failure: Secondary | ICD-10-CM | POA: Diagnosis not present

## 2019-07-01 DIAGNOSIS — I428 Other cardiomyopathies: Secondary | ICD-10-CM

## 2019-07-01 DIAGNOSIS — I110 Hypertensive heart disease with heart failure: Secondary | ICD-10-CM

## 2019-07-01 DIAGNOSIS — I447 Left bundle-branch block, unspecified: Secondary | ICD-10-CM | POA: Diagnosis not present

## 2019-07-01 DIAGNOSIS — I50814 Right heart failure due to left heart failure: Secondary | ICD-10-CM

## 2019-07-01 DIAGNOSIS — I5022 Chronic systolic (congestive) heart failure: Secondary | ICD-10-CM

## 2019-07-01 NOTE — Telephone Encounter (Signed)
Aware office will call to arrange testing. Aware she will get lab done when she comes in for Amyloid scan. Patient verbalized understanding and agreeable to plan.

## 2019-07-01 NOTE — Telephone Encounter (Signed)
-----   Message from Thompson Grayer, MD sent at 07/01/2019 11:21 AM EDT ----- Venida Jarvis,   please order myeloma profile and then PYP scan to evaluate for ATTR amyloidosis  Follow-up with me in [place] in a year

## 2019-07-01 NOTE — Progress Notes (Signed)
Electrophysiology TeleHealth Note  Due to national recommendations of social distancing due to [place] 19, an audio telehealth visit is felt to be most appropriate for this patient at this time.  Verbal consent was obtained by me for the telehealth visit today.  The patient does not have capability for a virtual visit.  A phone visit is therefore required today.   Date:  07/01/2019   ID:  Loretta Thomas, DOB November 11, 1950, MRN [id number]  Location: patient's home  Provider location:  [place]  Evaluation Performed: Follow-up visit  PCP:  Rossie Muskrat, DO   Electrophysiologist:  Dr Rayann Heman  Chief Complaint:  palpitations  History of Present Illness:    Loretta Thomas is a 69 y.o. female who presents via telehealth conferencing today.  Since last being seen in our clinic, the patient reports doing reasonably well.  She had device optimization in January and feels " a little stronger" since then. Today, she denies symptoms of palpitations, chest pain, shortness of breath,  lower extremity edema, dizziness, presyncope, or syncope.  The patient is otherwise without complaint today.     Past Medical History:  Diagnosis Date  . Anxiety   . Bladder cystocele   . Breast cancer ([place])    s/p L breast cancer removal  . Bundle branch block, left    diagnosed 03/2012 followed by cath showing only minimal CAD  . Bursitis   . Cancer ([place])    right breast  . Capsulitis   . Diverticulosis   . Elevated liver enzymes 2007  . History of IBS   . Hyperlipidemia   . Hypertension   . Nonischemic cardiomyopathy ([place])   . pernicious anemia     Past Surgical History:  Procedure Laterality Date  . ANKLE FRACTURE SURGERY    . BIV ICD INSERTION CRT-D N/A 01/03/2018    [facility] [employer] MP model [id number] (serial  Number [id number]) biventricular ICD for primary prevention of sudden death  . BREAST BIOPSY     right breast  . BREAST RECONSTRUCTION WITH PLACEMENT OF TISSUE EXPANDER  AND FLEX HD (ACELLULAR HYDRATED DERMIS) Right 01/28/2014   Procedure: RIGHT BREAST RECONSTRUCTION WITH PLACEMENT OF TISSUE EXPANDER;  Surgeon: Crissie Reese, MD;  Location: [place];  Service: Plastics;  Laterality: Right;  . CARDIAC CATHETERIZATION     x3; 03/18/12 [facility] [place]): 10-20% pLAD, <20% ostial LCX, 20% mPLA, EF 60%.    . CHOLECYSTECTOMY    . COLONOSCOPY     x4  . DILATION AND CURETTAGE OF UTERUS    . MASTECTOMY Right    malignant  . REDUCTION MAMMAPLASTY Left   . RHINOPLASTY    . SIMPLE MASTECTOMY WITH AXILLARY SENTINEL NODE BIOPSY Right 01/28/2014   Procedure: RIGHT TOTAL MASTECTOMY WITH  RIGHT AXILLARY SENTINEL NODE BIOPSY;  Surgeon: Excell Seltzer, MD;  Location: [place];  Service: General;  Laterality: Right;  . TONSILLECTOMY AND ADENOIDECTOMY    . TUBAL LIGATION      Current Outpatient Medications  Medication Sig Dispense Refill  . acetaminophen (TYLENOL) 500 MG tablet Take 500 mg by mouth every 6 (six) hours as needed.    . ALPRAZolam (XANAX) 0.5 MG tablet Take 0.5 mg by mouth every 8 (eight) hours as needed. for pain    . Calcium Carbonate-Vitamin D 600-400 MG-UNIT tablet Take 1 tablet by mouth 2 (two) times daily.    . carvedilol (COREG) 3.125 MG tablet TAKE 1 TABLET (3.125 MG TOTAL) BY MOUTH 2 (TWO)  TIMES DAILY WITH A MEAL. 60 tablet 6  . clemastine (TAVIST) 2.68 MG TABS tablet Take 2.68 mg by mouth 2 (two) times daily.    . cyanocobalamin (,VITAMIN B-12,) 1000 MCG/ML injection Inject 1 mL (1,000 mcg total) into the muscle every 30 (thirty) days. 10 mL 0  . famotidine (PEPCID) 40 MG tablet Take 40 mg by mouth daily as needed.   6  . furosemide (LASIX) 40 MG tablet Take 1 tablet (40 mg total) by mouth daily. 90 tablet 1  . letrozole (FEMARA) 2.5 MG tablet Take 1 tablet (2.5 mg total) by mouth daily. 90 tablet 3  . loratadine (CLARITIN) 10 MG tablet Take 10 mg by mouth daily.    Marland Kitchen losartan (COZAAR) 50 MG tablet Take 1 tablet (50 mg total) by mouth  daily. 90 tablet 2  . potassium chloride SA (KLOR-CON) 20 MEQ tablet Take 1 tablet (20 mEq total) by mouth daily. 90 tablet 2  . triamcinolone cream (KENALOG) 0.1 % Apply 1 application topically 2 (two) times daily as needed for itching.    . venlafaxine XR (EFFEXOR-XR) 37.5 MG 24 hr capsule Take 1 capsule (37.5 mg total) by mouth daily with breakfast. 90 capsule 1  . Vitamin D, Ergocalciferol, (DRISDOL) 50000 UNITS CAPS capsule Take 50,000 Units by mouth every 7 (seven) days. Fridays    . zolpidem (AMBIEN) 10 MG tablet Take 10 mg by mouth at bedtime.    . fluconazole (DIFLUCAN) 150 MG tablet Take 1 tablet by mouth every three (3) days as needed.     No current facility-administered medications for this visit.    Allergies:   Codeine, Demerol [meperidine], Hydrocodone, and Lortab [hydrocodone-acetaminophen]   Social History:  The patient  reports that she has never smoked. She has never used smokeless tobacco. She reports that she does not drink alcohol or use drugs.   Family History:  The patient's family history includes Breast cancer (age of onset: 44) in her cousin; Breast cancer (age of onset: 54) in her maternal aunt; Cancer in her brother and sister; Cancer (age of onset: 73) in her cousin; Cancer (age of onset: 41) in her maternal aunt; Cancer (age of onset: 85) in her maternal grandmother; Cancer (age of onset: 82) in her paternal grandfather; Cancer (age of onset: 45) in her father.   ROS:  Please see the history of present illness.   All other systems are personally reviewed and negative.    Exam:    Vital Signs:  BP 115/86   Pulse (!) 101   Ht 5\' 7"  (1.702 m)   Wt 186 lb 12.8 oz (84.7 kg)   BMI 29.26 kg/m   Well sounding, alert and conversant   Labs/Other Tests and Data Reviewed:    Recent Labs: No results found for requested labs within last 8760 hours.   Wt Readings from Last 3 Encounters:  07/01/19 186 lb 12.8 oz (84.7 kg)  05/26/19 185 lb 12.8 oz (84.3 kg)    04/07/19 184 lb (83.5 kg)     Last device remote is reviewed from [place] PDF which reveals normal device function, no arrhythmias   ASSESSMENT & PLAN:    1.  Chronic systolic dysfunction/ nonischemic CM/LBBB S/p recent device optimization due to persistently low EF.  Could consider multi site pacing in the future. Developed rash with entresto. Will evaluate for ATTR amyloidosis as a cause for her CHF with myeloma panel and PYP scan Continue to follow closely with Dr Irma Newness Short continues  to follow  Remotes are up to date Normal BIV ICD function  2. Hypertensive cardiovascular disease Stable No change required today    Follow-up:  with me in a year   Patient Risk:  after full review of this patients clinical status, I feel that they are at moderate risk at this time.  Today, I have spent 15 minutes with the patient with telehealth technology discussing arrhythmia management .    Army Fossa, MD  07/01/2019 11:15 AM     [facility] [address] [place] [address] [phone] (office) [phone] (fax)

## 2019-07-07 ENCOUNTER — Ambulatory Visit (INDEPENDENT_AMBULATORY_CARE_PROVIDER_SITE_OTHER): Payer: Federal, State, Local not specified - PPO | Admitting: *Deleted

## 2019-07-07 DIAGNOSIS — Z95810 Presence of automatic (implantable) cardiac defibrillator: Secondary | ICD-10-CM | POA: Diagnosis not present

## 2019-07-07 LAB — CUP PACEART REMOTE DEVICE CHECK
Battery Remaining Longevity: 72 mo
Battery Remaining Percentage: 79 %
Battery Voltage: 2.99 V
Brady Statistic AP VP Percent: 1 %
Brady Statistic AP VS Percent: 1 %
Brady Statistic AS VP Percent: 99 %
Brady Statistic AS VS Percent: 1 %
Brady Statistic RA Percent Paced: 1 %
Date Time Interrogation Session: 20210323020015
HighPow Impedance: 78 Ohm
HighPow Impedance: 78 Ohm
Implantable Lead Implant Date: 20190920
Implantable Lead Implant Date: 20190920
Implantable Lead Implant Date: 20190920
Implantable Lead Location: 753858
Implantable Lead Location: 753859
Implantable Lead Location: 753860
Implantable Pulse Generator Implant Date: 20190920
Lead Channel Impedance Value: 1000 Ohm
Lead Channel Impedance Value: 410 Ohm
Lead Channel Impedance Value: 530 Ohm
Lead Channel Pacing Threshold Amplitude: 0.5 V
Lead Channel Pacing Threshold Amplitude: 0.625 V
Lead Channel Pacing Threshold Amplitude: 1.125 V
Lead Channel Pacing Threshold Pulse Width: 0.5 ms
Lead Channel Pacing Threshold Pulse Width: 0.5 ms
Lead Channel Pacing Threshold Pulse Width: 0.5 ms
Lead Channel Sensing Intrinsic Amplitude: 11.8 mV
Lead Channel Sensing Intrinsic Amplitude: 3.4 mV
Lead Channel Setting Pacing Amplitude: 2 V
Lead Channel Setting Pacing Amplitude: 2 V
Lead Channel Setting Pacing Amplitude: 2.125
Lead Channel Setting Pacing Pulse Width: 0.5 ms
Lead Channel Setting Pacing Pulse Width: 0.5 ms
Lead Channel Setting Sensing Sensitivity: 0.5 mV
Pulse Gen Serial Number: 9833194

## 2019-07-08 ENCOUNTER — Telehealth (HOSPITAL_COMMUNITY): Payer: Self-pay | Admitting: *Deleted

## 2019-07-08 ENCOUNTER — Ambulatory Visit (INDEPENDENT_AMBULATORY_CARE_PROVIDER_SITE_OTHER): Payer: Federal, State, Local not specified - PPO

## 2019-07-08 DIAGNOSIS — Z95810 Presence of automatic (implantable) cardiac defibrillator: Secondary | ICD-10-CM

## 2019-07-08 DIAGNOSIS — I5022 Chronic systolic (congestive) heart failure: Secondary | ICD-10-CM | POA: Diagnosis not present

## 2019-07-08 NOTE — Progress Notes (Signed)
ICD Remote  

## 2019-07-08 NOTE — Telephone Encounter (Signed)
Patient given detailed instructions per Amyloid Study Information Sheet for the test on 07/13/2019 at 1315. Patient notified to arrive 15 minutes early and that it is imperative to arrive on time for appointment to keep from having the test rescheduled.  If you need to cancel or reschedule your appointment, please call the office within 24 hours of your appointment. . Patient verbalized understanding.[name], [name]

## 2019-07-13 ENCOUNTER — Other Ambulatory Visit: Payer: Self-pay

## 2019-07-13 ENCOUNTER — Other Ambulatory Visit: Payer: Federal, State, Local not specified - PPO

## 2019-07-13 ENCOUNTER — Ambulatory Visit (HOSPITAL_COMMUNITY): Payer: Federal, State, Local not specified - PPO | Attending: Internal Medicine

## 2019-07-13 DIAGNOSIS — I5022 Chronic systolic (congestive) heart failure: Secondary | ICD-10-CM

## 2019-07-13 MED ORDER — TECHNETIUM TC 99M PYROPHOSPHATE
21.7000 | Freq: Once | INTRAVENOUS | Status: AC
  Administered 2019-07-13: 21.7 via INTRAVENOUS

## 2019-07-13 NOTE — Progress Notes (Signed)
EPIC Encounter for ICM Monitoring  Patient Name: Loretta Thomas is a 69 y.o. female Date: 07/13/2019 Primary Care Physican: Rossie Muskrat, DO Primary Cardiologist:Koneswaran Electrophysiologist:Allred Bi-V Pacing:>99% 06/03/2019 Weight:181lbs  Transmission reviewed.  Corvue thoracic impedancenormal but was suggesting possible fluid accumulation from 2/25 - 3/9.  Prescribed:Furosemide40 mg take 1 tablet daily. Potassium 20 mEq 1 tablet daily  Labs: 01/04/2018 Creatinine0.69, BUN10, Potassium4.0, Sodium141, GFR>60  12/30/2017 Creatinine0.81, BUN12, Potassium4.1, Sodium143, [id number] A complete set of results can be found in Results Review.  Recommendations: None  Follow-up plan: ICM clinic phone appointment on4/26/2021. 91 day device clinic remote transmission 10/06/2019.   Copy of ICM check sent to Dr.Allred.   3 month ICM trend: 07/07/2019    1 Year ICM trend:       [name], RN 07/13/2019 10:27 AM

## 2019-07-14 ENCOUNTER — Telehealth: Payer: Self-pay | Admitting: Internal Medicine

## 2019-07-14 ENCOUNTER — Telehealth: Payer: Self-pay

## 2019-07-14 NOTE — Telephone Encounter (Signed)
-----   Message from Herminio Commons, MD sent at 07/14/2019  3:51 PM EDT ----- Will you please make a referral to Dr. Loralie Champagne for cardiac amyloidosis? ----- Message ----- From: Thompson Grayer, MD Sent: 07/14/2019   3:44 PM EDT To: Herminio Commons, MD, Damian Leavell, RN  Results reviewed.  Sonia Baller, please inform pt of result. I will route to Dr Bronson Ing to guide next steps in evaluation.

## 2019-07-14 NOTE — Telephone Encounter (Signed)
I messaged Kevan [facility] ,RN reagrding referral to [facility].

## 2019-07-14 NOTE — Telephone Encounter (Signed)
Patient states she had a stress yesterday and would like to know if she can go to her dentist appointment this Thursday. Please advise.

## 2019-07-14 NOTE — Telephone Encounter (Signed)
Pt wanted to make sure could go to dentist after having the myoview Pt aware may go as plan .cy

## 2019-07-15 ENCOUNTER — Telehealth: Payer: Self-pay | Admitting: Internal Medicine

## 2019-07-15 NOTE — Telephone Encounter (Signed)
Pt.notified

## 2019-07-15 NOTE — Telephone Encounter (Signed)
Patient called in regards to an upcoming appointment with [facility]. She stated that she was not aware of this appointment. Wants to discuss why she is being referred.

## 2019-07-15 NOTE — Telephone Encounter (Signed)
The cardiac amyloid study ordered by Dr. Rayann Heman was suggestive of a condition called amyloidosis.  This is the reason for the referral.

## 2019-07-17 LAB — MULTIPLE MYELOMA PANEL, SERUM
Albumin SerPl Elph-Mcnc: 3.7 g/dL (ref 2.9–4.4)
Albumin/Glob SerPl: 1.3 (ref 0.7–1.7)
Alpha 1: 0.2 g/dL (ref 0.0–0.4)
Alpha2 Glob SerPl Elph-Mcnc: 0.7 g/dL (ref 0.4–1.0)
B-Globulin SerPl Elph-Mcnc: 1.2 g/dL (ref 0.7–1.3)
Gamma Glob SerPl Elph-Mcnc: 0.7 g/dL (ref 0.4–1.8)
Globulin, Total: 2.9 g/dL (ref 2.2–3.9)
IgA/Immunoglobulin A, Serum: 246 mg/dL (ref 87–352)
IgG (Immunoglobin G), Serum: 865 mg/dL (ref 586–1602)
IgM (Immunoglobulin M), Srm: 54 mg/dL (ref 26–217)
Total Protein: 6.6 g/dL (ref 6.0–8.5)

## 2019-07-21 ENCOUNTER — Other Ambulatory Visit: Payer: Self-pay

## 2019-07-21 ENCOUNTER — Ambulatory Visit (HOSPITAL_COMMUNITY)
Admission: RE | Admit: 2019-07-21 | Discharge: 2019-07-21 | Disposition: A | Discharge status: Home / Self Care | Payer: Federal, State, Local not specified - PPO | Source: Ambulatory Visit | Attending: Internal Medicine | Admitting: Internal Medicine

## 2019-07-21 VITALS — BP 118/78 | HR 89 | Wt 190.1 lb

## 2019-07-21 DIAGNOSIS — I5022 Chronic systolic (congestive) heart failure: Secondary | ICD-10-CM

## 2019-07-21 DIAGNOSIS — Z79899 Other long term (current) drug therapy: Secondary | ICD-10-CM | POA: Diagnosis not present

## 2019-07-21 DIAGNOSIS — R0602 Shortness of breath: Secondary | ICD-10-CM | POA: Diagnosis not present

## 2019-07-21 DIAGNOSIS — I519 Heart disease, unspecified: Secondary | ICD-10-CM

## 2019-07-21 DIAGNOSIS — Z853 Personal history of malignant neoplasm of breast: Secondary | ICD-10-CM | POA: Insufficient documentation

## 2019-07-21 DIAGNOSIS — K589 Irritable bowel syndrome without diarrhea: Secondary | ICD-10-CM | POA: Insufficient documentation

## 2019-07-21 DIAGNOSIS — R21 Rash and other nonspecific skin eruption: Secondary | ICD-10-CM | POA: Diagnosis not present

## 2019-07-21 DIAGNOSIS — R0683 Snoring: Secondary | ICD-10-CM | POA: Diagnosis not present

## 2019-07-21 DIAGNOSIS — I2510 Atherosclerotic heart disease of native coronary artery without angina pectoris: Secondary | ICD-10-CM | POA: Insufficient documentation

## 2019-07-21 DIAGNOSIS — E785 Hyperlipidemia, unspecified: Secondary | ICD-10-CM | POA: Insufficient documentation

## 2019-07-21 DIAGNOSIS — R0789 Other chest pain: Secondary | ICD-10-CM | POA: Diagnosis not present

## 2019-07-21 DIAGNOSIS — Z7901 Long term (current) use of anticoagulants: Secondary | ICD-10-CM | POA: Diagnosis not present

## 2019-07-21 DIAGNOSIS — F419 Anxiety disorder, unspecified: Secondary | ICD-10-CM | POA: Insufficient documentation

## 2019-07-21 DIAGNOSIS — I110 Hypertensive heart disease with heart failure: Secondary | ICD-10-CM | POA: Insufficient documentation

## 2019-07-21 DIAGNOSIS — I447 Left bundle-branch block, unspecified: Secondary | ICD-10-CM | POA: Diagnosis not present

## 2019-07-21 DIAGNOSIS — I10 Essential (primary) hypertension: Secondary | ICD-10-CM

## 2019-07-21 LAB — BASIC METABOLIC PANEL
Anion gap: 10 (ref 5–15)
BUN: 9 mg/dL (ref 8–23)
CO2: 27 mmol/L (ref 22–32)
Calcium: 9.3 mg/dL (ref 8.9–10.3)
Chloride: 103 mmol/L (ref 98–111)
Creatinine, Ser: 0.77 mg/dL (ref 0.44–1.00)
GFR calc Af Amer: >60 mL/min (ref >60)
GFR calc non Af Amer: >60 mL/min (ref >60)
Glucose, Bld: 106 mg/dL — ABNORMAL HIGH (ref 70–99)
Potassium: 4.3 mmol/L (ref 3.5–5.1)
Sodium: 140 mmol/L (ref 135–145)

## 2019-07-21 LAB — BRAIN NATRIURETIC PEPTIDE: B Natriuretic Peptide: 16.6 pg/mL (ref 0.0–100.0)

## 2019-07-21 MED ORDER — SPIRONOLACTONE 25 MG PO TABS: 12.5000 mg | ORAL_TABLET | Freq: Every day | ORAL | 3 refills | Status: DC

## 2019-07-21 NOTE — Patient Instructions (Signed)
Stop Potassium  Start Spironolactone 12.5 mg (1/2 tab) daily  Labs done today we will call you for abnormal results  Blood collected for TTR genetic testing per Dr Haroldine Laws.  Order form completed and both shipped by FedEx to [facility].  Your provider has recommended that you have a home sleep study.  [employer] is the company that does these test.  They will contact you by phone and must speak with you before they can ship the equipment.  Once they have spoken with you they will send the equipment right to your home with instructions on how to set it up.  Once you have completed the test simply box all the equipment back up and mail back to the company.  IF you have any questions or issues with the equipment please call the company directly at [phone].  If your test is positive for sleep apnea and you need a home CPAP machine you will be contacted by Dr Theodosia Blender office [facility]) to set this up.  Your physician has requested that you have an echocardiogram. Echocardiography is a painless test that uses sound waves to create images of your heart. It provides your doctor with information about the size and shape of your heart and how well your heart's chambers and valves are working. This procedure takes approximately one hour. There are no restrictions for this procedure.  Your physician recommends that you schedule a follow-up appointment in: 2 months  If you have any questions or concerns before your next appointment please send [place] a message through [place] or call our office at [phone].  At the [facility], you and your health needs are our priority. As part of our continuing mission to provide you with exceptional heart care, we have created designated Provider Care Teams. These Care Teams include your primary Cardiologist (physician) and Advanced Practice Providers (APPs- Physician Assistants and Nurse Practitioners) who all work together to provide you with the  care you need, when you need it.   You may see any of the following providers on your designated Care Team at your next follow up: Marland Kitchen Dr Glori Bickers . Dr Loralie Champagne . Darrick Grinder, NP . Lyda Jester, PA . Audry Riles, PharmD   Please be sure to bring in all your medications bottles to every appointment.

## 2019-07-21 NOTE — Progress Notes (Signed)
Height: 5'7"    Weight: 190 lb BMI: 29.78  Today's Date: 07/21/2019  STOP BANG RISK ASSESSMENT S (snore) Have you been told that you snore?     YES   T (tired) Are you often tired, fatigued, or sleepy during the day?   YES  O (obstruction) Do you stop breathing, choke, or gasp during sleep? NO   P (pressure) Do you have or are you being treated for high blood pressure? YES   B (BMI) Is your body index greater than 35 kg/m? NO   A (age) Are you 68 years old or older? YES   N (neck) Do you have a neck circumference greater than 16 inches?      G (gender) Are you a female? NO   TOTAL STOP/BANG "YES" ANSWERS 4                                                                       For Office Use Only              Procedure Order Form    YES to 3+ Stop Bang questions OR two clinical symptoms - patient qualifies for WatchPAT (CPT 95800)      Clinical Notes: Will consult Sleep Specialist and refer for management of therapy due to patient increased risk of Sleep Apnea. Ordering a sleep study due to the following two clinical symptoms: Excessive daytime sleepiness G47.10 / Loud snoring R06.83  Which test do you need, WP1 or WP300???  . Do you have access to a smart device containing the app stores?     If NO, then  --->WP300

## 2019-07-21 NOTE — Progress Notes (Signed)
Blood collected for TTR genetic testing per Dr Bensimhon.  Order form completed and both shipped by FedEx to [facility].  

## 2019-07-21 NOTE — Progress Notes (Signed)
ADVANCED HF CLINIC CONSULT NOTE  Referring Physician: Primary Care: Primary Cardiologist:  HPI:  Loretta Thomas is a 69 y.o. female who is being seen today for the evaluation of congestive heart failure at the request of Dr. Jacinta Shoe  She has of a severe HTN, anxiety, LBBB and HF has had multiple heart catheterizations over the past two decades: 2002 [facility]), 2011 [facility]), 2013 [facility]). All showed normal or minimal CAD.  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  Developed LV dysfunction in 2019 (I cannot find echo from before them either in our system or at [facility]).   Echo 10/22/17 revealed EF 15%, mild LVH, global HK, moderate MR. Underwent BivICD in 9/19.   Repeat echo 1/21 EF 20-25% with normal RV and underwent AV optimization  Had PYP scan 07/13/19 Strongly suggestive of TTR amyloidosis or (Strongly suggestive: A semi-quantitative visual score of 2 or 3 or H/CL ratio >1.5) - 1.8  Says she doesn't have a lot of energy and gets out of breath easily. Can go to the mailbox and do ADLs without too much problem. Gets very SOB with stairs. Takes lasix 40 daily with pretty good control. Mild edema. No orthopnea or PND. Occasioanl chest tightness. Sleeps in love seat. Snores if she lies down flat. SPEP negative.   She developed a rash with Entresto in 7/10  ICD interrogated personally in clinic: Volume status ok. 99% VP. No VT/VF Glori Bickers, MD  8:01 PM     Review of Systems: [y] = yes, [ ]  = no   General: Weight gain [y]; Weight loss [ ] ; Anorexia [ ] ; Fatigue [ ] ; Fever [ ] ; Chills [ ] ; Weakness Blue.Reese ]  Cardiac: Chest pain/pressure Blue.Reese ]; Resting SOB [ ] ; Exertional SOB Blue.Reese ]; Orthopnea [ ] ; Pedal Edema Blue.Reese ]; Palpitations [ y]; Syncope [ ] ; Presyncope [ ] ; Paroxysmal nocturnal dyspnea[ ]   Pulmonary: Cough [ ] ;  Wheezing[ ] ; Hemoptysis[ ] ; Sputum [ ] ; Snoring [ ]   GI: Vomiting[ ] ; Dysphagia[ ] ; Melena[ ] ; Hematochezia [ ] ; Heartburn[ ] ; Abdominal pain [ ] ; Constipation [ ] ; Diarrhea [ ] ; BRBPR [ ]   GU: Hematuria[ ] ; Dysuria [ ] ; Nocturia[ ]   Vascular: Pain in legs with walking [ ] ; Pain in feet with lying flat [ ] ; Non-healing sores [ ] ; Stroke [ ] ; TIA [ ] ; Slurred speech [ ] ;  Neuro: Headaches[ y]; Vertigo[ ] ; Seizures[ ] ; Paresthesias[ ] ;Blurred vision [ ] ; Diplopia [ ] ; Vision changes [ ]   Ortho/Skin: Arthritis Blue.Reese ]; Joint pain Blue.Reese ]; Muscle pain [ ] ; Joint swelling [ ] ; Back Pain [ ] ; Rash [ ]   Psych: Depression[ ] ; Anxiety[y]  Heme: Bleeding problems [ ] ; Clotting disorders [ ] ; Anemia [ ]   Endocrine: Diabetes [ ] ; Thyroid dysfunction[ ]    Past Medical History:  Diagnosis Date  . Anxiety   . Bladder cystocele   . Breast cancer ([place])    s/p L breast cancer removal  . Bundle branch block, left    diagnosed 03/2012 followed by cath showing only minimal CAD  . Bursitis   . Cancer ([place])    right breast  . Capsulitis   . Diverticulosis   . Elevated liver enzymes 2007  . History of IBS   . Hyperlipidemia   . Hypertension   . Nonischemic cardiomyopathy ([place])   . pernicious anemia  Current Outpatient Medications  Medication Sig Dispense Refill  . acetaminophen (TYLENOL) 500 MG tablet Take 500 mg by mouth every 6 (six) hours as needed.    . ALPRAZolam (XANAX) 0.5 MG tablet Take 0.5 mg by mouth every 8 (eight) hours as needed. for pain    . Calcium Carbonate-Vitamin D 600-400 MG-UNIT tablet Take 1 tablet by mouth 2 (two) times daily.    . carvedilol (COREG) 3.125 MG tablet TAKE 1 TABLET (3.125 MG TOTAL) BY MOUTH 2 (TWO) TIMES DAILY WITH A MEAL. 60 tablet 6  . cetirizine (ZYRTEC) 10 MG tablet Take 10 mg by mouth daily.    . clemastine (TAVIST) 2.68 MG TABS tablet Take 2.68 mg by mouth 2 (two) times daily.    . cyanocobalamin (,VITAMIN B-12,) 1000 MCG/ML injection Inject 1 mL (1,000 mcg  total) into the muscle every 30 (thirty) days. 10 mL 0  . famotidine (PEPCID) 40 MG tablet Take 40 mg by mouth daily as needed.   6  . fluconazole (DIFLUCAN) 150 MG tablet Take 1 tablet by mouth every three (3) days as needed.    . furosemide (LASIX) 40 MG tablet Take 1 tablet (40 mg total) by mouth daily. 90 tablet 1  . letrozole (FEMARA) 2.5 MG tablet Take 1 tablet (2.5 mg total) by mouth daily. 90 tablet 3  . loratadine (CLARITIN) 10 MG tablet Take 10 mg by mouth daily.    Marland Kitchen losartan (COZAAR) 50 MG tablet Take 1 tablet (50 mg total) by mouth daily. 90 tablet 2  . potassium chloride SA (KLOR-CON) 20 MEQ tablet Take 1 tablet (20 mEq total) by mouth daily. 90 tablet 2  . triamcinolone cream (KENALOG) 0.1 % Apply 1 application topically 2 (two) times daily as needed for itching.    . venlafaxine XR (EFFEXOR-XR) 37.5 MG 24 hr capsule Take 1 capsule (37.5 mg total) by mouth daily with breakfast. 90 capsule 1  . Vitamin D, Ergocalciferol, (DRISDOL) 50000 UNITS CAPS capsule Take 50,000 Units by mouth every 7 (seven) days. Fridays    . zolpidem (AMBIEN) 10 MG tablet Take 10 mg by mouth at bedtime.     No current facility-administered medications for this encounter.    Allergies  Allergen Reactions  . Codeine     Stomach Pains  . Demerol [Meperidine]     Upset stomach  . Hydrocodone   . Lortab [Hydrocodone-Acetaminophen]     Upset stomach      Social History   Socioeconomic History  . Marital status: Married    Spouse name: Not on file  . Number of children: Not on file  . Years of education: Not on file  . Highest education level: Not on file  Occupational History  . Not on file  Tobacco Use  . Smoking status: Never Smoker  . Smokeless tobacco: Never Used  Substance and Sexual Activity  . Alcohol use: No  . Drug use: No  . Sexual activity: Yes  Other Topics Concern  . Not on file  Social History Narrative   Lives in [place] Determinants of Health   Financial  Resource Strain:   . Difficulty of Paying Living Expenses:   Food Insecurity:   . Worried About Charity fundraiser in the Last Year:   . Arboriculturist in the Last Year:   Transportation Needs:   . Film/video editor (Medical):   Marland Kitchen Lack of Transportation (Non-Medical):   Physical Activity:   . Days  of Exercise per Week:   . Minutes of Exercise per Session:   Stress:   . Feeling of Stress :   Social Connections:   . Frequency of Communication with Friends and Family:   . Frequency of Social Gatherings with Friends and Family:   . Attends Religious Services:   . Active Member of Clubs or Organizations:   . Attends Archivist Meetings:   Marland Kitchen Marital Status:   Intimate Partner Violence:   . Fear of Current or Ex-Partner:   . Emotionally Abused:   Marland Kitchen Physically Abused:   . Sexually Abused:       Family History  Problem Relation Age of Onset  . Cancer Sister        skin and colon x2  . Cancer Brother        esophageal  . Cancer Father 19       mouth  . Cancer Maternal Aunt 28       breast  . Breast cancer Maternal Aunt 10  . Cancer Maternal Grandmother 39       ovarian  . Cancer Cousin 30       mat 1st cousin with breast  . Breast cancer Cousin 65  . Cancer Paternal Grandfather 72       lung    Vitals:   07/21/19 1132  BP: 118/78  Pulse: 89  SpO2: 96%  Weight: 86.2 kg (190 lb 2 oz)    PHYSICAL EXAM: General:  Well appearing. No respiratory difficulty HEENT: normal Neck: supple. no JVD. Carotids 2+ bilat; no bruits. No lymphadenopathy or thryomegaly appreciated. Cor: PMI nondisplaced. Regular rate & rhythm. No rubs, gallops or murmurs. Lungs: clear Abdomen: obese soft, nontender, nondistended. No hepatosplenomegaly. No bruits or masses. Good bowel sounds. Extremities: no cyanosis, clubbing, rash, edema Neuro: alert & oriented x 3, cranial nerves grossly intact. moves all 4 extremities w/o difficulty. Affect pleasant.  ECG:  04/07/19 NSR 79 biv  pacing QRs 31ms Personally reviewed   ASSESSMENT & PLAN:  1. Chronic systolic HF  - due to presumed NICM - multiple cardiac caths with no CAD (last 2013) - EF 15% in 7/19 felt due to LBBB - s/p St Jude CRT-D in 9/19 - Echo 1/21 EF 25%. 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 SPEP negative - Volume status ok - NYHA II-III - On losartan 50 daily  (rash with Entresto) - carvedilol 3.125 bid - Will add spiro 12.5 daily - I have reviewed all studies including echo and PYP scan.  Feel PYP scan is equivocal and based on echo findings (completely normal RV) I do not think she has cardiac amyloid or at least not severe enough to cause this degree of LV dysfunction. She has undergone AV optimization and will repeat echo to see if her EF is improving. Will also get sleep study and send genetic panel. - Will work on aggressive titration of HF meds - If EF not improving will repeat PYP scan in 4-6 months.   2. LBBB - s/p CRT-D  3. Snoring - check sleep study  4. Breast Cancer - treated with surgery and letrazole (no chemo or XRT)  Glori Bickers, MD  8:08 PM

## 2019-08-05 ENCOUNTER — Ambulatory Visit (HOSPITAL_COMMUNITY)
Admission: RE | Admit: 2019-08-05 | Discharge: 2019-08-05 | Disposition: A | Discharge status: Home / Self Care | Payer: Federal, State, Local not specified - PPO | Source: Ambulatory Visit | Attending: Internal Medicine | Admitting: Internal Medicine

## 2019-08-05 ENCOUNTER — Other Ambulatory Visit: Payer: Self-pay

## 2019-08-05 DIAGNOSIS — I519 Heart disease, unspecified: Secondary | ICD-10-CM | POA: Diagnosis not present

## 2019-08-05 DIAGNOSIS — E785 Hyperlipidemia, unspecified: Secondary | ICD-10-CM | POA: Diagnosis not present

## 2019-08-05 DIAGNOSIS — R0602 Shortness of breath: Secondary | ICD-10-CM | POA: Diagnosis not present

## 2019-08-05 DIAGNOSIS — I110 Hypertensive heart disease with heart failure: Secondary | ICD-10-CM | POA: Diagnosis not present

## 2019-08-05 DIAGNOSIS — I447 Left bundle-branch block, unspecified: Secondary | ICD-10-CM | POA: Diagnosis not present

## 2019-08-05 DIAGNOSIS — I509 Heart failure, unspecified: Secondary | ICD-10-CM | POA: Diagnosis present

## 2019-08-05 NOTE — Progress Notes (Signed)
  Echocardiogram 2D Echocardiogram has been performed.  Matilde Bash 08/05/2019, 1:40 PM

## 2019-08-10 ENCOUNTER — Ambulatory Visit (INDEPENDENT_AMBULATORY_CARE_PROVIDER_SITE_OTHER): Payer: Federal, State, Local not specified - PPO

## 2019-08-10 DIAGNOSIS — Z95810 Presence of automatic (implantable) cardiac defibrillator: Secondary | ICD-10-CM

## 2019-08-10 DIAGNOSIS — I5022 Chronic systolic (congestive) heart failure: Secondary | ICD-10-CM

## 2019-08-11 ENCOUNTER — Telehealth: Payer: Self-pay

## 2019-08-11 ENCOUNTER — Telehealth: Payer: Self-pay | Admitting: Internal Medicine

## 2019-08-11 NOTE — Telephone Encounter (Signed)
Left message for patient to remind of missed remote transmission.  

## 2019-08-11 NOTE — Telephone Encounter (Signed)
Patient called stating someone called her, no voicemail was left. I did not see anything documented in her chart.

## 2019-08-12 NOTE — Progress Notes (Signed)
EPIC Encounter for ICM Monitoring  Patient Name: Loretta Thomas is a 69 y.o. female Date: 08/12/2019 Primary Care Physican: Rossie Muskrat, DO Primary Cardiologist:Koneswaran/Bensimhon Electrophysiologist:Allred Bi-V Pacing:>99% 4/28/2021Weight:187lbs  Spoke with patient.  She reports swelling of her hand, stomach bloating and gurgling in her chest over the last few days.  She eats a lot of restaurant foods.  Corvue thoracic impedancesuggesting possible fluid accumulation from 4/24 but starting to trend toward baseline.  Prescribed:Furosemide40 mg take 1 tablet daily.  Spironolactone 25 mg take 0.5 tablet (12.5mg  total) daily  Labs: 07/21/2019 Creatinine 0.77, BUN 9, Potassium 4.3, Sodium 140, GFR >60 A complete set of results can be found in Results Review.  Recommendations:Advised to take 1 extra Furosemide tablet x 2 days then return to 1 tablet daily.  Advised to avoid restaurant foods and other foods high in salt.   Follow-up plan: ICM clinic phone appointment on4/302021 to recheck fluid levels. 91 day device clinic remote transmission 10/06/2019.   Copy of ICM check sent to Dr.Allred and Dr Haroldine Laws.  3 month ICM trend: 08/11/2019    1 Year ICM trend:       [name], RN 08/12/2019 11:22 AM

## 2019-08-14 ENCOUNTER — Ambulatory Visit (INDEPENDENT_AMBULATORY_CARE_PROVIDER_SITE_OTHER): Payer: Federal, State, Local not specified - PPO

## 2019-08-14 ENCOUNTER — Telehealth: Payer: Self-pay

## 2019-08-14 DIAGNOSIS — Z95810 Presence of automatic (implantable) cardiac defibrillator: Secondary | ICD-10-CM

## 2019-08-14 DIAGNOSIS — I5022 Chronic systolic (congestive) heart failure: Secondary | ICD-10-CM

## 2019-08-14 NOTE — Telephone Encounter (Signed)
Remote ICM transmission received.  Attempted call to patient regarding ICM remote transmission and left message to return call   

## 2019-08-14 NOTE — Progress Notes (Signed)
EPIC Encounter for ICM Monitoring  Patient Name: Loretta Thomas is a 69 y.o. female Date: 08/14/2019 Primary Care Physican: Rossie Muskrat, DO Primary Cardiologist:Koneswaran/Bensimhon Electrophysiologist:Allred Bi-V Pacing:>99% 4/28/2021Weight:187lbs  Attempted call to patient and unable to reach.  Left message to return call. Transmission reviewed.   Corvue thoracic impedancereturned to baseline normal after taking extra Furosemide.  Prescribed:Furosemide40 mg take 1 tablet daily.  Spironolactone 25 mg take 0.5 tablet (12.5mg  total) daily  Labs: 07/21/2019 Creatinine 0.77, BUN 9, Potassium 4.3, Sodium 140, GFR >60 A complete set of results can be found in Results Review.  Recommendations:Unable to reach.    Follow-up plan: ICM clinic phone appointment on6/04/2019. 91 day device clinic remote transmission6/22/2021.   Copy of ICM check sent to Dr.Allred.  3 month ICM trend: 08/14/2019    1 Year ICM trend:       [name], RN 08/14/2019 3:38 PM

## 2019-08-26 ENCOUNTER — Other Ambulatory Visit: Payer: Self-pay | Admitting: Cardiovascular Disease

## 2019-08-26 DIAGNOSIS — I5022 Chronic systolic (congestive) heart failure: Secondary | ICD-10-CM

## 2019-08-26 NOTE — Telephone Encounter (Signed)
This is a [facility] pt °

## 2019-08-26 NOTE — Telephone Encounter (Signed)
Dr. Bronson Ing prescribed this medication. Please address

## 2019-09-15 ENCOUNTER — Ambulatory Visit (INDEPENDENT_AMBULATORY_CARE_PROVIDER_SITE_OTHER): Payer: Federal, State, Local not specified - PPO

## 2019-09-15 DIAGNOSIS — I5022 Chronic systolic (congestive) heart failure: Secondary | ICD-10-CM

## 2019-09-15 DIAGNOSIS — Z95810 Presence of automatic (implantable) cardiac defibrillator: Secondary | ICD-10-CM

## 2019-09-16 ENCOUNTER — Telehealth: Payer: Self-pay

## 2019-09-16 NOTE — Telephone Encounter (Signed)
Remote ICM transmission received.  Attempted call to patient regarding ICM remote transmission and left detailed message per DPR.

## 2019-09-16 NOTE — Progress Notes (Signed)
EPIC Encounter for ICM Monitoring  Patient Name: Loretta Thomas is a 69 y.o. female Date: 09/16/2019 Primary Care Physican: Rossie Muskrat, DO Primary Cardiologist:Koneswaran/Bensimhon Electrophysiologist:Allred Bi-V Pacing:>99% 4/28/2021Weight:187lbs   Spoke with patient.  She has some swelling of the ankles.  She reports she probably ate foods high in salt because they have been camping for the last few days.    Corvue thoracic impedancesuggesting possible fluid accumulation since 09/07/2019.  Prescribed:Furosemide40 mg take 1 tablet daily. Spironolactone 25 mg take 0.5 tablet (12.5mg  total) daily  Labs: 07/21/2019 Creatinine 0.77, BUN 9, Potassium 4.3, Sodium 140, GFR >60 A complete set of results can be found in Results Review.  Recommendations:Advised to take Furosemide 40 mg bid x 3 days and then return to 1 tablet daily.   Follow-up plan: ICM clinic phone appointment on6/10/2019 (manual send) to recheck fluid levels. 91 day device clinic remote transmission6/22/2021.  Office appt 09/30/2019 with Dr. Haroldine Laws.    Copy of ICM check sent to Dr. Rayann Heman and Dr Haroldine Laws  3 month ICM trend: 09/15/2019    1 Year ICM trend:       [name], RN 09/16/2019 3:01 PM

## 2019-09-17 ENCOUNTER — Encounter (INDEPENDENT_AMBULATORY_CARE_PROVIDER_SITE_OTHER): Payer: Federal, State, Local not specified - PPO | Admitting: Cardiology

## 2019-09-17 DIAGNOSIS — G4733 Obstructive sleep apnea (adult) (pediatric): Secondary | ICD-10-CM

## 2019-09-18 ENCOUNTER — Ambulatory Visit (INDEPENDENT_AMBULATORY_CARE_PROVIDER_SITE_OTHER): Payer: Federal, State, Local not specified - PPO

## 2019-09-18 DIAGNOSIS — I5022 Chronic systolic (congestive) heart failure: Secondary | ICD-10-CM

## 2019-09-18 DIAGNOSIS — Z95810 Presence of automatic (implantable) cardiac defibrillator: Secondary | ICD-10-CM

## 2019-09-18 NOTE — Progress Notes (Signed)
EPIC Encounter for ICM Monitoring  Patient Name: Emanie Behan is a 69 y.o. female Date: 09/18/2019 Primary Care Physican: Rossie Muskrat, DO Primary Cardiologist:Koneswaran/Bensimhon Electrophysiologist:Allred Bi-V Pacing:>99% 6/4/2021Weight:189lbs   Spoke with patient and ankle swelling in feet have resolved.  She stated she is feeling fine now.  Corvue thoracic impedancereturned to normal since taking extra Furosemide.  Prescribed:Furosemide40 mg take 1 tablet daily. Spironolactone 25 mg take 0.5 tablet (12.5mg  total) daily  Labs: 07/21/2019 Creatinine 0.77, BUN 9, Potassium 4.3, Sodium 140, GFR >60 A complete set of results can be found in Results Review.  Recommendations:No changes and encouraged to call if experiencing any fluid symptoms.  Follow-up plan: ICM clinic phone appointment on7/09/2019. 91 day device clinic remote transmission6/22/2021.  Office appt 09/30/2019 with Dr. Haroldine Laws.    Copy of ICM check sent to Dr. Rayann Heman and Dr Haroldine Laws to show impedance returned normal.   3 month ICM trend: 09/18/2019    1 Year ICM trend:       [name], RN 09/18/2019 3:03 PM

## 2019-09-22 ENCOUNTER — Other Ambulatory Visit: Payer: Self-pay | Admitting: Cardiovascular Disease

## 2019-09-27 ENCOUNTER — Ambulatory Visit: Payer: Federal, State, Local not specified - PPO

## 2019-09-27 NOTE — Procedures (Signed)
   Sleep Study Report  Patient Information Name: Loretta Thomas  ID: [id number] Birth Date: 1950-05-24  Age: 69  Gender: Female Study Date:09/17/2019 Referring Physician Information  TEST DESCRIPTION: Home sleep apnea testing was completed using the WatchPat, a Type 1 device, utilizing peripheral arterial tonometry (PAT), chest movement, actigraphy, pulse oximetry, pulse rate, body position and snore. AHI was calculated with apnea and hypopnea using valid sleep time as the denominator. RDI includes apneas, hypopneas, and RERAs. The data acquired and the scoring of sleep and all associated events were performed in accordance with the recommended standards and specifications as outlined in the AASM Manual for the Scoring of Sleep and Associated Events 2.2.0 (2015)  FINDINGS:  1. Mild Obstructive Sleep Apnea with AHI 7.1/hr.  2. No Central Sleep Apnea with pAHIc 0/hr. 3. Oxygen desaturations as low as 83%. 4. Mild snoring was present. O2 sats were < 88% for 4.94minutes. 5. Total sleep time was 8 hrs and 59 min. 6. 23.5% of total sleep time was spent in REM sleep.  7. Normal sleep onset latency at 21 min.  8. Shortened REM sleep onset latency at 67 min.  9. Total awakenings were 3.   DIAGNOSIS:  Mild Obstructive Sleep Apnea (G47.33)  RECOMMENDATIONS: 1. Clinical correlation of these findings is necessary. The decision to treat obstructive sleep apnea (OSA) is usually based on the presence of apnea symptoms or the presence of associated medical conditions such as Hypertension, Congestive Heart Failure, Atrial Fibrillation or Obesity. The most common symptoms of OSA are snoring, gasping for breath while sleeping, daytime sleepiness and fatigue.   2. Initiating apnea therapy is recommended given the presence of symptoms and/or associated conditions.   Recommend proceeding with one of the following:  a. Auto-CPAP therapy with a pressure range of 5-20cm H2O.   b. An oral appliance (OA) that  can be obtained from certain dentists with expertise in sleep medicine. These are primarily of use in non-obese patients with mild and moderate disease.   c. An ENT consultation which may be useful to look for specific causes of obstruction and possible treatment options.   d. If patient is intolerant to PAP therapy, consider referral to ENT for evaluation for hypoglossal nerve stimulator.   3. Close follow-up is necessary to ensure success with CPAP or oral appliance therapy for maximum benefit .  4. A follow-up oximetry study on CPAP is recommended to assess the adequacy of therapy and determine the need for supplemental oxygen or the potential need for Bi-level therapy. An arterial blood gas to determine the adequacy of baseline ventilation and oxygenation should also be considered.  5. Healthy sleep recommendations include: adequate nightly sleep (normal 7-9 hrs/night), avoidance of caffeine after noon and alcohol near bedtime, and maintaining a sleep environment that is cool, dark and quiet.  6. Weight loss for overweight patients is recommended. Even modest amounts of weight loss can significantly improve the severity of sleep apnea.  7. Snoring recommendations include: weight loss where appropriate, side sleeping, and avoidance of alcohol before bed.  8. Operation of motor vehicle or dangerous equipment must be avoided when feeling drowsy, excessively sleepy, or mentally fatigued.  Report prepared by: Signature: Fransico Him, MD Diplomat ABSM  Electronically Signed: Sep 27, 2019

## 2019-09-28 ENCOUNTER — Telehealth: Payer: Self-pay | Admitting: *Deleted

## 2019-09-28 DIAGNOSIS — G4733 Obstructive sleep apnea (adult) (pediatric): Secondary | ICD-10-CM

## 2019-09-28 NOTE — Telephone Encounter (Signed)
Informed patient of sleep study results and patient understanding was verbalized. Patient understands her sleep study showed they have very mild sleep apnea and set up Virtual visit to discuss results. Pt is aware and agreeable to her results.  Patient has a virtual appointment scheduled for Monday 10/12/19 at 9:20.

## 2019-09-28 NOTE — Telephone Encounter (Signed)
°  Patient Consent for Virtual Visit         [name] has provided verbal consent on 09/28/2019 for a virtual visit (video or telephone).   CONSENT FOR VIRTUAL VISIT FOR:  Loretta Thomas  By participating in this virtual visit I agree to the following:  I hereby voluntarily request, consent and authorize [facility] and its employed or contracted physicians, physician assistants, nurse practitioners or other licensed health care professionals (the Practitioner), to provide me with telemedicine health care services (the Services") as deemed necessary by the treating Practitioner. I acknowledge and consent to receive the Services by the Practitioner via telemedicine. I understand that the telemedicine visit will involve communicating with the Practitioner through live audiovisual communication technology and the disclosure of certain medical information by electronic transmission. I acknowledge that I have been given the opportunity to request an in-person assessment or other available alternative prior to the telemedicine visit and am voluntarily participating in the telemedicine visit.  I understand that I have the right to withhold or withdraw my consent to the use of telemedicine in the course of my care at any time, without affecting my right to future care or treatment, and that the Practitioner or I may terminate the telemedicine visit at any time. I understand that I have the right to inspect all information obtained and/or recorded in the course of the telemedicine visit and may receive copies of available information for a reasonable fee.  I understand that some of the potential risks of receiving the Services via telemedicine include:   Delay or interruption in medical evaluation due to technological equipment failure or disruption;  Information transmitted may not be sufficient (e.g. poor resolution of images) to allow for appropriate medical decision making by the Practitioner;  and/or   In rare instances, security protocols could fail, causing a breach of personal health information.  Furthermore, I acknowledge that it is my responsibility to provide information about my medical history, conditions and care that is complete and accurate to the best of my ability. I acknowledge that Practitioner's advice, recommendations, and/or decision may be based on factors not within their control, such as incomplete or inaccurate data provided by me or distortions of diagnostic images or specimens that may result from electronic transmissions. I understand that the practice of medicine is not an exact science and that Practitioner makes no warranties or guarantees regarding treatment outcomes. I acknowledge that a copy of this consent can be made available to me via my patient portal ([facility]), or I can request a printed copy by calling the office of [facility].    I understand that my insurance will be billed for this visit.   I have read or had this consent read to me.  I understand the contents of this consent, which adequately explains the benefits and risks of the Services being provided via telemedicine.   I have been provided ample opportunity to ask questions regarding this consent and the Services and have had my questions answered to my satisfaction.  I give my informed consent for the services to be provided through the use of telemedicine in my medical care

## 2019-09-28 NOTE — Telephone Encounter (Signed)
-----   Message from Sueanne Margarita, MD sent at 09/27/2019  9:45 PM EDT ----- Please let patient know that they have very mild sleep apnea and set up Virtual visit to discuss results

## 2019-09-29 NOTE — Progress Notes (Signed)
ADVANCED HF CLINIC  NOTE   Primary Cardiologist: Jacinta Shoe  HPI:  Loretta Thomas is a 69 y.o. female with severe HTN, anxiety, LBBB and systolic HF with EF 55-97%. She has had multiple heart catheterizations over the past two decades: 2002 [facility]), 2011 [facility]), 2013 [facility]). All showed normal or minimal CAD.  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 No chemoRx or XRT. 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  Developed LV dysfunction in 2019 (I cannot find echo from before them either in our system or at [facility]).   Echo 10/22/17 revealed EF 15%, mild LVH, global HK, moderate MR. Underwent BivICD in 9/19.   Repeat echo 1/21 EF 20-25% with normal RV and underwent AV optimization  Had PYP scan 07/13/19 Strongly suggestive of TTR amyloidosis or (Strongly suggestive: A semi-quantitative visual score of 2 or 3 or H/CL ratio >1.5) - 1.8  She developed a rash with Entresto in 7/10  I saw her for the first time in 4/21 and felt she likely did not have TTR. Ordered repeat echo and sleep study. Started spiro 12.5  Echo 4/21 EF 25-30% Sleep study 4/21 AHI 7.1  Here for f/u. Says she feels much better now. Able to get around more. No SOB, orthopnea or PND. Had some fluid build up last week and took extra dose of lasix.   Past Medical History:  Diagnosis Date  . Anxiety   . Bladder cystocele   . Breast cancer ([place])    s/p L breast cancer removal  . Bundle branch block, left    diagnosed 03/2012 followed by cath showing only minimal CAD  . Bursitis   . Cancer ([place])    right breast  . Capsulitis   . Diverticulosis   . Elevated liver enzymes 2007  . History of IBS   . Hyperlipidemia   . Hypertension   . Nonischemic cardiomyopathy ([place])   . pernicious anemia     Current Outpatient Medications  Medication Sig Dispense Refill  . acetaminophen (TYLENOL) 500 MG tablet Take 500 mg  by mouth every 6 (six) hours as needed.    . ALPRAZolam (XANAX) 0.5 MG tablet Take 0.5 mg by mouth every 8 (eight) hours as needed. for pain    . Calcium Carbonate-Vitamin D 600-400 MG-UNIT tablet Take 1 tablet by mouth 2 (two) times daily.    . carvedilol (COREG) 3.125 MG tablet TAKE 1 TABLET (3.125 MG TOTAL) BY MOUTH 2 (TWO) TIMES DAILY WITH A MEAL. 60 tablet 6  . cetirizine (ZYRTEC) 10 MG tablet Take 10 mg by mouth daily.    . clemastine (TAVIST) 2.68 MG TABS tablet Take 2.68 mg by mouth 2 (two) times daily.    . cyanocobalamin (,VITAMIN B-12,) 1000 MCG/ML injection Inject 1 mL (1,000 mcg total) into the muscle every 30 (thirty) days. 10 mL 0  . famotidine (PEPCID) 40 MG tablet Take 40 mg by mouth daily as needed.   6  . fluconazole (DIFLUCAN) 150 MG tablet Take 1 tablet by mouth every three (3) days as needed.    . furosemide (LASIX) 40 MG tablet TAKE 1 TABLET BY MOUTH EVERY DAY 30 tablet 1  . letrozole (FEMARA) 2.5 MG tablet Take 1 tablet (2.5 mg total) by mouth daily. 90 tablet 3  . loratadine (CLARITIN) 10 MG tablet Take 10 mg by mouth daily.    Marland Kitchen losartan (COZAAR) 50 MG tablet Take 1 tablet (  50 mg total) by mouth daily. 90 tablet 2  . spironolactone (ALDACTONE) 25 MG tablet Take 0.5 tablets (12.5 mg total) by mouth daily. 15 tablet 3  . triamcinolone cream (KENALOG) 0.1 % Apply 1 application topically 2 (two) times daily as needed for itching.    . venlafaxine XR (EFFEXOR-XR) 37.5 MG 24 hr capsule Take 1 capsule (37.5 mg total) by mouth daily with breakfast. 90 capsule 1  . Vitamin D, Ergocalciferol, (DRISDOL) 50000 UNITS CAPS capsule Take 50,000 Units by mouth every 7 (seven) days. Fridays    . zolpidem (AMBIEN) 10 MG tablet Take 10 mg by mouth at bedtime.     No current facility-administered medications for this encounter.    Allergies  Allergen Reactions  . Codeine     Stomach Pains  . Demerol [Meperidine]     Upset stomach  . Hydrocodone   . Lortab [Hydrocodone-Acetaminophen]      Upset stomach      Social History   Socioeconomic History  . Marital status: Married    Spouse name: Not on file  . Number of children: Not on file  . Years of education: Not on file  . Highest education level: Not on file  Occupational History  . Not on file  Tobacco Use  . Smoking status: Never Smoker  . Smokeless tobacco: Never Used  Vaping Use  . Vaping Use: Never used  Substance and Sexual Activity  . Alcohol use: No  . Drug use: No  . Sexual activity: Yes  Other Topics Concern  . Not on file  Social History Narrative   Lives in [place] Determinants of Health   Financial Resource Strain:   . Difficulty of Paying Living Expenses:   Food Insecurity:   . Worried About Charity fundraiser in the Last Year:   . Arboriculturist in the Last Year:   Transportation Needs:   . Film/video editor (Medical):   Marland Kitchen Lack of Transportation (Non-Medical):   Physical Activity:   . Days of Exercise per Week:   . Minutes of Exercise per Session:   Stress:   . Feeling of Stress :   Social Connections:   . Frequency of Communication with Friends and Family:   . Frequency of Social Gatherings with Friends and Family:   . Attends Religious Services:   . Active Member of Clubs or Organizations:   . Attends Archivist Meetings:   Marland Kitchen Marital Status:   Intimate Partner Violence:   . Fear of Current or Ex-Partner:   . Emotionally Abused:   Marland Kitchen Physically Abused:   . Sexually Abused:       Family History  Problem Relation Age of Onset  . Cancer Sister        skin and colon x2  . Cancer Brother        esophageal  . Cancer Father 6       mouth  . Cancer Maternal Aunt 28       breast  . Breast cancer Maternal Aunt 24  . Cancer Maternal Grandmother 55       ovarian  . Cancer Cousin 65       mat 1st cousin with breast  . Breast cancer Cousin 79  . Cancer Paternal Grandfather 43       lung    Vitals:   09/30/19 0945  BP: 122/76  Pulse: 85    SpO2: 97%  Weight: 86.5 kg (  190 lb 9.6 oz)    PHYSICAL EXAM: General:  Well appearing. No resp difficulty HEENT: normal Neck: supple. no JVD. Carotids 2+ bilat; no bruits. No lymphadenopathy or thryomegaly appreciated. Cor: PMI nondisplaced. Regular rate & rhythm. No rubs, gallops or murmurs. Lungs: clear Abdomen: soft, nontender, nondistended. No hepatosplenomegaly. No bruits or masses. Good bowel sounds. Extremities: no cyanosis, clubbing, rash, edema Neuro: alert & orientedx3, cranial nerves grossly intact. moves all 4 extremities w/o difficulty. Affect pleasant   ASSESSMENT & PLAN:  1. Chronic systolic HF  - due to presumed NICM - multiple cardiac caths with no CAD (last 2013) - EF 15% in 7/19 felt due to LBBB - s/p St Jude CRT-D in 9/19 - Echo 1/21 EF 25%. 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 SPEP negative - Volume status ok - Improved NYHA II - Continue losartan 50 daily  (rash with Entresto) - Continue carvedilol 3.125 bid - Continue spiro 12.5 daily - Add Farxiga - Switch lasix to 40 qod  - I have reviewed all studies including echo and PYP scan.  Feel PYP scan is equivocal and based on echo findings (completely normal RV) I do not think she has cardiac amyloid or at least not severe enough to cause this degree of LV dysfunction. She has undergone AV optimization - Myeloma panel negative. Resend genetic testing  - Repeat PYP scan in 4-6 months.   2. LBBB - s/p CRT-D  3. Snoring - Very mild OSA on sleep study 4/21. AHI 7.1  4. Breast Cancer - treated with surgery and letrazole (no chemo or XRT)  Glori Bickers, MD  10:51 PM

## 2019-09-30 ENCOUNTER — Ambulatory Visit (HOSPITAL_COMMUNITY)
Admission: RE | Admit: 2019-09-30 | Discharge: 2019-09-30 | Disposition: A | Discharge status: Home / Self Care | Payer: Federal, State, Local not specified - PPO | Source: Ambulatory Visit | Attending: Internal Medicine | Admitting: Internal Medicine

## 2019-09-30 ENCOUNTER — Telehealth (HOSPITAL_COMMUNITY): Payer: Self-pay | Admitting: Pharmacy Technician

## 2019-09-30 ENCOUNTER — Other Ambulatory Visit: Payer: Self-pay

## 2019-09-30 ENCOUNTER — Encounter (HOSPITAL_COMMUNITY): Payer: Self-pay | Admitting: Internal Medicine

## 2019-09-30 VITALS — BP 122/76 | HR 85 | Wt 190.6 lb

## 2019-09-30 DIAGNOSIS — Z79899 Other long term (current) drug therapy: Secondary | ICD-10-CM | POA: Insufficient documentation

## 2019-09-30 DIAGNOSIS — F419 Anxiety disorder, unspecified: Secondary | ICD-10-CM | POA: Diagnosis not present

## 2019-09-30 DIAGNOSIS — Z9011 Acquired absence of right breast and nipple: Secondary | ICD-10-CM | POA: Insufficient documentation

## 2019-09-30 DIAGNOSIS — G4733 Obstructive sleep apnea (adult) (pediatric): Secondary | ICD-10-CM | POA: Diagnosis not present

## 2019-09-30 DIAGNOSIS — Z803 Family history of malignant neoplasm of breast: Secondary | ICD-10-CM | POA: Insufficient documentation

## 2019-09-30 DIAGNOSIS — I5022 Chronic systolic (congestive) heart failure: Secondary | ICD-10-CM | POA: Insufficient documentation

## 2019-09-30 DIAGNOSIS — E785 Hyperlipidemia, unspecified: Secondary | ICD-10-CM | POA: Diagnosis not present

## 2019-09-30 DIAGNOSIS — K589 Irritable bowel syndrome without diarrhea: Secondary | ICD-10-CM | POA: Insufficient documentation

## 2019-09-30 DIAGNOSIS — Z885 Allergy status to narcotic agent status: Secondary | ICD-10-CM | POA: Insufficient documentation

## 2019-09-30 DIAGNOSIS — Z853 Personal history of malignant neoplasm of breast: Secondary | ICD-10-CM | POA: Insufficient documentation

## 2019-09-30 DIAGNOSIS — I447 Left bundle-branch block, unspecified: Secondary | ICD-10-CM | POA: Insufficient documentation

## 2019-09-30 DIAGNOSIS — I2510 Atherosclerotic heart disease of native coronary artery without angina pectoris: Secondary | ICD-10-CM | POA: Insufficient documentation

## 2019-09-30 DIAGNOSIS — I110 Hypertensive heart disease with heart failure: Secondary | ICD-10-CM | POA: Diagnosis not present

## 2019-09-30 DIAGNOSIS — Z95810 Presence of automatic (implantable) cardiac defibrillator: Secondary | ICD-10-CM | POA: Diagnosis not present

## 2019-09-30 LAB — BASIC METABOLIC PANEL
Anion gap: 9 (ref 5–15)
BUN: 10 mg/dL (ref 8–23)
CO2: 27 mmol/L (ref 22–32)
Calcium: 9.4 mg/dL (ref 8.9–10.3)
Chloride: 103 mmol/L (ref 98–111)
Creatinine, Ser: 0.83 mg/dL (ref 0.44–1.00)
GFR calc Af Amer: >60 mL/min (ref >60)
GFR calc non Af Amer: >60 mL/min (ref >60)
Glucose, Bld: 107 mg/dL — ABNORMAL HIGH (ref 70–99)
Potassium: 4.1 mmol/L (ref 3.5–5.1)
Sodium: 139 mmol/L (ref 135–145)

## 2019-09-30 MED ORDER — FUROSEMIDE 40 MG PO TABS: 40.0000 mg | ORAL_TABLET | ORAL | 3 refills | Status: DC

## 2019-09-30 MED ORDER — DAPAGLIFLOZIN PROPANEDIOL 10 MG PO TABS: 10.0000 mg | ORAL_TABLET | Freq: Every day | ORAL | 5 refills | Status: DC

## 2019-09-30 MED ORDER — FLUCONAZOLE 200 MG PO TABS: 200.0000 mg | ORAL_TABLET | Freq: Every day | ORAL | 0 refills | Status: DC | PRN

## 2019-09-30 NOTE — Addendum Note (Signed)
Encounter addended by: Shonna Chock, CMA on: 4/83/5075 10:28 AM  Actions taken: Order list changed, Diagnosis association updated, Charge Capture section accepted, Clinical Note Signed

## 2019-09-30 NOTE — Patient Instructions (Addendum)
Labs done today. We will contact you only if your labs are abnormal.  START Wilder Glade 10mg (1 tablet) by mouth daily. (a pharmacy card was given to you today in office)  -Fluconazole was also prescribed to use if needed due to starting the [place] Furosemide(Lasix) 40mg  take 1 tablet by mouth every other day.  No other medication changes were made. Please continue all other medications as prescribed.  Your physician recommends that you schedule a follow-up appointment in: 2-3 months with Dr. Haroldine Laws  If you have any questions or concerns before your next appointment please send [place] a message through [facility] or call our office at [phone].    TO LEAVE A MESSAGE FOR THE NURSE SELECT OPTION 2, PLEASE LEAVE A MESSAGE INCLUDING: . YOUR NAME . DATE OF BIRTH . CALL BACK NUMBER . REASON FOR CALL**this is important as we prioritize the call backs  [place] AS LONG AS YOU CALL BEFORE 4:00 PM   At the [facility], you and your health needs are our priority. As part of our continuing mission to provide you with exceptional heart care, we have created designated Provider Care Teams. These Care Teams include your primary Cardiologist (physician) and Advanced Practice Providers (APPs- Physician Assistants and Nurse Practitioners) who all work together to provide you with the care you need, when you need it.   You may see any of the following providers on your designated Care Team at your next follow up: Marland Kitchen Dr Glori Bickers . Dr Loralie Champagne . Darrick Grinder, NP . Lyda Jester, PA . Audry Riles, PharmD   Please be sure to bring in all your medications bottles to every appointment.

## 2019-09-30 NOTE — Telephone Encounter (Signed)
Advanced Heart Failure Patient Advocate Encounter  Prior Authorization for Wilder Glade has been approved.    PA# [id number] Effective dates: 08/31/19 through 09/29/20  Patients co-pay is $15.00  Charlann Boxer, CPhT

## 2019-10-06 ENCOUNTER — Ambulatory Visit (INDEPENDENT_AMBULATORY_CARE_PROVIDER_SITE_OTHER): Payer: Federal, State, Local not specified - PPO | Admitting: *Deleted

## 2019-10-06 DIAGNOSIS — I5022 Chronic systolic (congestive) heart failure: Secondary | ICD-10-CM

## 2019-10-06 DIAGNOSIS — I428 Other cardiomyopathies: Secondary | ICD-10-CM

## 2019-10-06 LAB — CUP PACEART REMOTE DEVICE CHECK
Battery Remaining Longevity: 70 mo
Battery Remaining Percentage: 76 %
Battery Voltage: 2.98 V
Brady Statistic AP VP Percent: 1 %
Brady Statistic AP VS Percent: 1 %
Brady Statistic AS VP Percent: 99 %
Brady Statistic AS VS Percent: 1 %
Brady Statistic RA Percent Paced: 1 %
Date Time Interrogation Session: 20210622020021
HighPow Impedance: 78 Ohm
HighPow Impedance: 78 Ohm
Implantable Lead Implant Date: 20190920
Implantable Lead Implant Date: 20190920
Implantable Lead Implant Date: 20190920
Implantable Lead Location: 753858
Implantable Lead Location: 753859
Implantable Lead Location: 753860
Implantable Pulse Generator Implant Date: 20190920
Lead Channel Impedance Value: 1125 Ohm
Lead Channel Impedance Value: 410 Ohm
Lead Channel Impedance Value: 530 Ohm
Lead Channel Pacing Threshold Amplitude: 0.5 V
Lead Channel Pacing Threshold Amplitude: 0.625 V
Lead Channel Pacing Threshold Amplitude: 1.125 V
Lead Channel Pacing Threshold Pulse Width: 0.5 ms
Lead Channel Pacing Threshold Pulse Width: 0.5 ms
Lead Channel Pacing Threshold Pulse Width: 0.5 ms
Lead Channel Sensing Intrinsic Amplitude: 11.8 mV
Lead Channel Sensing Intrinsic Amplitude: 3.8 mV
Lead Channel Setting Pacing Amplitude: 2 V
Lead Channel Setting Pacing Amplitude: 2 V
Lead Channel Setting Pacing Amplitude: 2.125
Lead Channel Setting Pacing Pulse Width: 0.5 ms
Lead Channel Setting Pacing Pulse Width: 0.5 ms
Lead Channel Setting Sensing Sensitivity: 0.5 mV
Pulse Gen Serial Number: 9833194

## 2019-10-07 NOTE — Progress Notes (Signed)
Remote ICD transmission.   

## 2019-10-11 NOTE — Progress Notes (Signed)
Virtual Visit via Telephone Note   This visit type was conducted due to national recommendations for restrictions regarding the COVID-19 Pandemic (e.g. social distancing) in an effort to limit this patient's exposure and mitigate transmission in our community.  Due to her co-morbid illnesses, this patient is at least at moderate risk for complications without adequate follow up.  This format is felt to be most appropriate for this patient at this time.  The patient did not have access to video technology/had technical difficulties with video requiring transitioning to audio format only (telephone).  All issues noted in this document were discussed and addressed.  No physical exam could be performed with this format.  Please refer to the patient's chart for her  consent to telehealth for [facility].  Evaluation Performed:  Follow-up visit  This visit type was conducted due to national recommendations for restrictions regarding the COVID-19 Pandemic (e.g. social distancing).  This format is felt to be most appropriate for this patient at this time.  All issues noted in this document were discussed and addressed.  No physical exam was performed (except for noted visual exam findings with Video Visits).  Please refer to the patient's chart (MyChart message for video visits and phone note for telephone visits) for the patient's consent to telehealth for [facility].  Date:  10/12/2019   ID:  Loretta Thomas, DOB 13-Feb-1951, MRN [id number]  Patient Location:  Home  Provider location:   [place]  PCP:  Rossie Muskrat, DO  Cardiologist:  Kate Sable, MD  Sleep Medicine:  Fransico Him, MD Electrophysiologist:  Thompson Grayer, MD   Chief Complaint:  OSA  History of Present Illness:    Loretta Thomas is a 69 y.o. female who presents via audio/video conferencing for a telehealth visit today.    This is a 69yo female with a hx of HLD, HTN and NICM with EF 20-25% who was referred for  sleep study due to CHF.  She underwent home sleep study and was found to have mild OSA with an AHI of 7.1/hr with mild snoring and O2 sats as low as 83% but did not qualify has nocturnal hypoxemia.  She is now here to discuss results of her study.  She goes to bed at 11pm-1am and gets up at 6:30am.  She gets up a few times a night to use the restroom.  She had breast cancer surgery and sleeps propped up in a recliner.  She wakes up feeling tired in the am and can easily fall asleep when she sits down. She tells me that she will wake herself up snoring but has never been told she stops breathing in her sleep.  She wake up with a dry mouth at night as well.  She has not had any morning HAs recently.           Prior CV studies:   The following studies were reviewed today:  Home sleep study  Past Medical History:  Diagnosis Date  . Anxiety   . Bladder cystocele   . Breast cancer ([place])    s/p L breast cancer removal  . Bundle branch block, left    diagnosed 03/2012 followed by cath showing only minimal CAD  . Bursitis   . Cancer ([place])    right breast  . Capsulitis   . Diverticulosis   . Elevated liver enzymes 2007  . History of IBS   . Hyperlipidemia   . Hypertension   . Nonischemic cardiomyopathy ([place])   .  pernicious anemia    Past Surgical History:  Procedure Laterality Date  . ANKLE FRACTURE SURGERY    . BIV ICD INSERTION CRT-D N/A 01/03/2018    [facility] [employer] MP model [phone] (serial  Number [id number]) biventricular ICD for primary prevention of sudden death  . BREAST BIOPSY     right breast  . BREAST RECONSTRUCTION WITH PLACEMENT OF TISSUE EXPANDER AND FLEX HD (ACELLULAR HYDRATED DERMIS) Right 01/28/2014   Procedure: RIGHT BREAST RECONSTRUCTION WITH PLACEMENT OF TISSUE EXPANDER;  Surgeon: Crissie Reese, MD;  Location: [place];  Service: Plastics;  Laterality: Right;  . CARDIAC CATHETERIZATION     x3; 03/18/12 [facility] [place]): 10-20% pLAD, <20%  ostial LCX, 20% mPLA, EF 60%.    . CHOLECYSTECTOMY    . COLONOSCOPY     x4  . DILATION AND CURETTAGE OF UTERUS    . MASTECTOMY Right    malignant  . REDUCTION MAMMAPLASTY Left   . RHINOPLASTY    . SIMPLE MASTECTOMY WITH AXILLARY SENTINEL NODE BIOPSY Right 01/28/2014   Procedure: RIGHT TOTAL MASTECTOMY WITH  RIGHT AXILLARY SENTINEL NODE BIOPSY;  Surgeon: Excell Seltzer, MD;  Location: [facility];  Service: General;  Laterality: Right;  . TONSILLECTOMY AND ADENOIDECTOMY    . TUBAL LIGATION       Current Meds  Medication Sig  . Calcium Carbonate-Vitamin D 600-400 MG-UNIT tablet Take 1 tablet by mouth 2 (two) times daily.  . carvedilol (COREG) 3.125 MG tablet TAKE 1 TABLET (3.125 MG TOTAL) BY MOUTH 2 (TWO) TIMES DAILY WITH A MEAL.  . clemastine (TAVIST) 2.68 MG TABS tablet Take 2.68 mg by mouth 2 (two) times daily.  . dapagliflozin propanediol (FARXIGA) 10 MG TABS tablet Take 1 tablet (10 mg total) by mouth daily before breakfast.  . famotidine (PEPCID) 40 MG tablet Take 40 mg by mouth daily as needed.   . fluconazole (DIFLUCAN) 200 MG tablet Take 1 tablet (200 mg total) by mouth daily as needed.  . furosemide (LASIX) 40 MG tablet Take 1 tablet (40 mg total) by mouth every other day.  . letrozole (FEMARA) 2.5 MG tablet Take 1 tablet (2.5 mg total) by mouth daily.  Marland Kitchen loratadine (CLARITIN) 10 MG tablet Take 10 mg by mouth daily as needed.   Marland Kitchen losartan (COZAAR) 50 MG tablet Take 1 tablet (50 mg total) by mouth daily.  Marland Kitchen spironolactone (ALDACTONE) 25 MG tablet Take 0.5 tablets (12.5 mg total) by mouth daily. (Patient taking differently: Take 12.5 mg by mouth every other day. )  . triamcinolone cream (KENALOG) 0.1 % Apply 1 application topically 2 (two) times daily as needed for itching.  . venlafaxine XR (EFFEXOR-XR) 37.5 MG 24 hr capsule Take 1 capsule (37.5 mg total) by mouth daily with breakfast.  . Vitamin D, Ergocalciferol, (DRISDOL) 50000 UNITS CAPS capsule Take 50,000 Units by mouth every  7 (seven) days. Fridays  . zolpidem (AMBIEN) 10 MG tablet Take 10 mg by mouth at bedtime.     Allergies:   Codeine, Demerol [meperidine], Hydrocodone, and Lortab [hydrocodone-acetaminophen]   Social History   Tobacco Use  . Smoking status: Never Smoker  . Smokeless tobacco: Never Used  Vaping Use  . Vaping Use: Never used  Substance Use Topics  . Alcohol use: No  . Drug use: No     Family Hx: The patient's family history includes Breast cancer (age of onset: 44) in her cousin; Breast cancer (age of onset: 48) in her maternal aunt; Cancer in her  brother and sister; Cancer (age of onset: 47) in her cousin; Cancer (age of onset: 56) in her maternal aunt; Cancer (age of onset: 105) in her maternal grandmother; Cancer (age of onset: 96) in her paternal grandfather; Cancer (age of onset: 76) in her father.  ROS:   Please see the history of present illness.     All other systems reviewed and are negative.   Labs/Other Tests and Data Reviewed:    Recent Labs: 07/21/2019: B Natriuretic Peptide 16.6 09/30/2019: BUN 10; Creatinine, Ser 0.83; Potassium 4.1; Sodium 139   Recent Lipid Panel Lab Results  Component Value Date/Time   CHOL 153 06/21/2014 10:35 AM   TRIG 150 (H) 06/21/2014 10:35 AM   HDL 46 06/21/2014 10:35 AM   CHOLHDL 3.3 06/21/2014 10:35 AM   LDLCALC 77 06/21/2014 10:35 AM    Wt Readings from Last 3 Encounters:  10/12/19 188 lb (85.3 kg)  09/30/19 190 lb 9.6 oz (86.5 kg)  07/21/19 190 lb 2 oz (86.2 kg)     Objective:    Vital Signs:  Ht 5\' 7"  (1.702 m)   Wt 188 lb (85.3 kg)   BMI 29.44 kg/m     ASSESSMENT & PLAN:    1.  OSA -the results of her home sleep study showed mild OSA with an AHI of 7.1/hr and no central events or nocturnal hypoxemia.   -she has significant daytime sleepiness that may, in part, be attributed to occasional poor sleep hygiene -given her daytime sleepiness, CHF with significant LV dysfunction and mild OSA, I have recommended that we  start her on auto CPAP from 4-15cm H2O and mask of choice with heated humidity -she will see me back 8 weeks after she gets her device  2.  HTN -continue Carvedilol 3.125mg  BID, Losartan 50mg  daily and spiro 12.5mg  daily.  3.  NICM -followed by AHF service -EF 20-25%   Time:   Today, I have spent 20 minutes on telemedicine discussing medical problems including OSA< HTN, NICM and reviewing patient's chart including home sleep study.  Medication Adjustments/Labs and Tests Ordered: Current medicines are reviewed at length with the patient today.  Concerns regarding medicines are outlined above.  Tests Ordered: No orders of the defined types were placed in this encounter.  Medication Changes: No orders of the defined types were placed in this encounter.   Disposition:  Follow up in 8 week(s) after getting her device   Signed, Fransico Him, MD  10/12/2019 9:36 AM    [place]

## 2019-10-12 ENCOUNTER — Telehealth (INDEPENDENT_AMBULATORY_CARE_PROVIDER_SITE_OTHER): Payer: Federal, State, Local not specified - PPO | Admitting: Cardiology

## 2019-10-12 ENCOUNTER — Encounter: Payer: Self-pay | Admitting: Cardiology

## 2019-10-12 VITALS — Ht 67.0 in | Wt 188.0 lb

## 2019-10-12 DIAGNOSIS — G4733 Obstructive sleep apnea (adult) (pediatric): Secondary | ICD-10-CM

## 2019-10-12 DIAGNOSIS — I10 Essential (primary) hypertension: Secondary | ICD-10-CM

## 2019-10-12 DIAGNOSIS — I428 Other cardiomyopathies: Secondary | ICD-10-CM | POA: Diagnosis not present

## 2019-10-12 NOTE — Telephone Encounter (Signed)
Loretta Margarita, MD  Freada Bergeron, CMA ResMed auto CPAP from 4-15cm H2O and mask of choice with heated humidity with [facility] and followup with me in 8 weeks after getting her device.

## 2019-10-12 NOTE — Telephone Encounter (Addendum)
Order placed to [place]:   DME selection is [place] ,[place]. Patient understands he will be contacted by [facility] to set up his cpap. Patient understands to call if [facility] does not contact him with new setup in a timely manner. Patient understands they will be called once confirmation has been received from [facility] that they have received their new machine to schedule 10 week follow up appointment.  [facility] notified of new cpap order  Please add to airview Patient was grateful for the call and thanked me.

## 2019-10-12 NOTE — Addendum Note (Signed)
Addended by: Freada Bergeron on: 10/12/2019 12:09 PM   Modules accepted: Orders

## 2019-10-14 ENCOUNTER — Other Ambulatory Visit: Payer: Self-pay | Admitting: Cardiovascular Disease

## 2019-10-14 DIAGNOSIS — I5022 Chronic systolic (congestive) heart failure: Secondary | ICD-10-CM

## 2019-10-20 ENCOUNTER — Ambulatory Visit (INDEPENDENT_AMBULATORY_CARE_PROVIDER_SITE_OTHER): Payer: Federal, State, Local not specified - PPO

## 2019-10-20 DIAGNOSIS — I5022 Chronic systolic (congestive) heart failure: Secondary | ICD-10-CM | POA: Diagnosis not present

## 2019-10-20 DIAGNOSIS — Z95810 Presence of automatic (implantable) cardiac defibrillator: Secondary | ICD-10-CM | POA: Diagnosis not present

## 2019-10-21 NOTE — Progress Notes (Signed)
EPIC Encounter for ICM Monitoring  Patient Name: Loretta Thomas is a 69 y.o. female Date: 10/21/2019 Primary Care Physican: Rossie Muskrat, DO Primary Cardiologist:Bensimhon Electrophysiologist:Allred Bi-V Pacing:>99% 6/4/2021Weight:189lbs   Spoke with patient and reports feeling well at this time.  Denies fluid symptoms.    Corvue thoracic impedancenormal but was suggesting possible fluid accumulation 7/1 - 7/4.  Prescribed:  Furosemide40 mg take 1 tablet every other day.  Spironolactone 25 mg take 0.5 tablet (12.5mg  total) every other day  Labs: 09/30/2019 Creatinine 0.83, BUN 10, Potassium 4.1, Sodium 139, GFR >60 07/21/2019 Creatinine 0.77, BUN 9,   Potassium 4.3, Sodium 140, GFR >60 A complete set of results can be found in Results Review.  Recommendations: No changes and encouraged to call if experiencing any fluid symptoms.  Follow-up plan: ICM clinic phone appointment on 11/23/2019.   91 day device clinic remote transmission 01/05/2020.    EP/Cardiology Office Visits: 12/17/2019 with Dr. Haroldine Laws.    Copy of ICM check sent to Dr. Rayann Heman.   3 month ICM trend: 10/20/2019    1 Year ICM trend:       Loretta Billings, RN 10/21/2019 9:52 AM

## 2019-10-22 ENCOUNTER — Telehealth: Payer: Self-pay | Admitting: *Deleted

## 2019-10-22 NOTE — Telephone Encounter (Signed)
Received call from pt stating [facility] [place] is referring her to a dermatologist for evaluation and treatment of a area found on her right and left eye lid.  Pt states the area was a skin tag at one time that has since fallen off and has formed a crusted area that has not gone away.  Pt states she will keep our office up to date with the findings from the dermatologist and possible biopsy.

## 2019-11-11 ENCOUNTER — Other Ambulatory Visit (HOSPITAL_COMMUNITY): Payer: Self-pay | Admitting: Internal Medicine

## 2019-11-17 ENCOUNTER — Other Ambulatory Visit: Payer: Self-pay | Admitting: *Deleted

## 2019-11-17 MED ORDER — LETROZOLE 2.5 MG PO TABS: 2.5000 mg | ORAL_TABLET | Freq: Every day | ORAL | 3 refills | Status: DC

## 2019-11-23 ENCOUNTER — Ambulatory Visit (INDEPENDENT_AMBULATORY_CARE_PROVIDER_SITE_OTHER): Payer: Federal, State, Local not specified - PPO

## 2019-11-23 DIAGNOSIS — Z95810 Presence of automatic (implantable) cardiac defibrillator: Secondary | ICD-10-CM

## 2019-11-23 DIAGNOSIS — I5022 Chronic systolic (congestive) heart failure: Secondary | ICD-10-CM

## 2019-11-25 ENCOUNTER — Telehealth: Payer: Self-pay

## 2019-11-25 NOTE — Progress Notes (Signed)
EPIC Encounter for ICM Monitoring  Patient Name: Loretta Thomas is a 69 y.o. female Date: 11/25/2019 Primary Care Physican: Rossie Muskrat, DO Primary Cardiologist:Bensimhon Electrophysiologist:Allred Bi-V Pacing:>99% 8/11/2021Weight:189lbs   Spoke with patient and reports feeling well at this time.  Denies fluid symptoms.    Corvue thoracic impedancenormal.  Prescribed:  Furosemide40 mg take 1 tablet every other day.  Spironolactone 25 mg take 0.5 tablet (12.5mg  total) every other day  Labs: 09/30/2019 Creatinine 0.83, BUN 10, Potassium 4.1, Sodium 139, GFR >60 07/21/2019 Creatinine 0.77, BUN 9,   Potassium 4.3, Sodium 140, GFR >60 A complete set of results can be found in Results Review.  Recommendations: No changes and encouraged to call if experiencing any fluid symptoms.    Follow-up plan: ICM clinic phone appointment on 12/28/2019.   91 day device clinic remote transmission 01/05/2020.    EP/Cardiology Office Visits: 12/17/2019 with Dr. Haroldine Laws.  Recall for 06/25/2020 with Dr Rayann Heman.  Copy of ICM check sent to Dr. Rayann Heman.   3 month ICM trend: 11/23/2019    1 Year ICM trend:       [name], RN 11/25/2019 8:42 AM

## 2019-11-25 NOTE — Telephone Encounter (Signed)
Remote ICM transmission received.  Attempted call to patient regarding ICM remote transmission and left message to return call   

## 2019-12-17 ENCOUNTER — Ambulatory Visit (HOSPITAL_COMMUNITY)
Admission: RE | Admit: 2019-12-17 | Discharge: 2019-12-17 | Disposition: A | Discharge status: Home / Self Care | Payer: Federal, State, Local not specified - PPO | Source: Ambulatory Visit | Attending: Internal Medicine | Admitting: Internal Medicine

## 2019-12-17 ENCOUNTER — Other Ambulatory Visit: Payer: Self-pay

## 2019-12-17 VITALS — BP 142/76 | HR 90 | Wt 189.0 lb

## 2019-12-17 DIAGNOSIS — Z885 Allergy status to narcotic agent status: Secondary | ICD-10-CM | POA: Diagnosis not present

## 2019-12-17 DIAGNOSIS — F419 Anxiety disorder, unspecified: Secondary | ICD-10-CM | POA: Diagnosis not present

## 2019-12-17 DIAGNOSIS — I2510 Atherosclerotic heart disease of native coronary artery without angina pectoris: Secondary | ICD-10-CM | POA: Insufficient documentation

## 2019-12-17 DIAGNOSIS — I447 Left bundle-branch block, unspecified: Secondary | ICD-10-CM | POA: Insufficient documentation

## 2019-12-17 DIAGNOSIS — Z853 Personal history of malignant neoplasm of breast: Secondary | ICD-10-CM | POA: Diagnosis not present

## 2019-12-17 DIAGNOSIS — I5022 Chronic systolic (congestive) heart failure: Secondary | ICD-10-CM

## 2019-12-17 DIAGNOSIS — I428 Other cardiomyopathies: Secondary | ICD-10-CM | POA: Insufficient documentation

## 2019-12-17 DIAGNOSIS — I110 Hypertensive heart disease with heart failure: Secondary | ICD-10-CM | POA: Diagnosis not present

## 2019-12-17 DIAGNOSIS — Z808 Family history of malignant neoplasm of other organs or systems: Secondary | ICD-10-CM | POA: Diagnosis not present

## 2019-12-17 DIAGNOSIS — G4733 Obstructive sleep apnea (adult) (pediatric): Secondary | ICD-10-CM | POA: Diagnosis not present

## 2019-12-17 DIAGNOSIS — Z79899 Other long term (current) drug therapy: Secondary | ICD-10-CM | POA: Insufficient documentation

## 2019-12-17 DIAGNOSIS — K589 Irritable bowel syndrome without diarrhea: Secondary | ICD-10-CM | POA: Insufficient documentation

## 2019-12-17 DIAGNOSIS — I519 Heart disease, unspecified: Secondary | ICD-10-CM | POA: Diagnosis not present

## 2019-12-17 DIAGNOSIS — Z801 Family history of malignant neoplasm of trachea, bronchus and lung: Secondary | ICD-10-CM | POA: Insufficient documentation

## 2019-12-17 DIAGNOSIS — E785 Hyperlipidemia, unspecified: Secondary | ICD-10-CM | POA: Insufficient documentation

## 2019-12-17 DIAGNOSIS — Z79811 Long term (current) use of aromatase inhibitors: Secondary | ICD-10-CM | POA: Diagnosis not present

## 2019-12-17 DIAGNOSIS — Z7984 Long term (current) use of oral hypoglycemic drugs: Secondary | ICD-10-CM | POA: Insufficient documentation

## 2019-12-17 DIAGNOSIS — Z8041 Family history of malignant neoplasm of ovary: Secondary | ICD-10-CM | POA: Diagnosis not present

## 2019-12-17 DIAGNOSIS — Z803 Family history of malignant neoplasm of breast: Secondary | ICD-10-CM | POA: Diagnosis not present

## 2019-12-17 DIAGNOSIS — Z9011 Acquired absence of right breast and nipple: Secondary | ICD-10-CM | POA: Insufficient documentation

## 2019-12-17 MED ORDER — CARVEDILOL 6.25 MG PO TABS: 6.2500 mg | ORAL_TABLET | Freq: Two times a day (BID) | ORAL | 6 refills | Status: DC

## 2019-12-17 NOTE — Patient Instructions (Signed)
INCREASE Carvedilol 6.25mg  Twice daily  Your physician has requested that you have an echocardiogram. Echocardiography is a painless test that uses sound waves to create images of your heart. It provides your doctor with information about the size and shape of your heart and how well your heart's chambers and valves are working. This procedure takes approximately one hour. There are no restrictions for this procedure.  Follow up with our office in 3-4 months.  If you have any questions or concerns before your next appointment please send [place] a message through [place] or call our office at [phone].    TO LEAVE A MESSAGE FOR THE NURSE SELECT OPTION 2, PLEASE LEAVE A MESSAGE INCLUDING: . YOUR NAME . DATE OF BIRTH . CALL BACK NUMBER . REASON FOR CALL**this is important as we prioritize the call backs  [place] AS LONG AS YOU CALL BEFORE 4:00 PM  At the [facility], you and your health needs are our priority. As part of our continuing mission to provide you with exceptional heart care, we have created designated Provider Care Teams. These Care Teams include your primary Cardiologist (physician) and Advanced Practice Providers (APPs- Physician Assistants and Nurse Practitioners) who all work together to provide you with the care you need, when you need it.   You may see any of the following providers on your designated Care Team at your next follow up: Marland Kitchen Dr Glori Bickers . Dr Loralie Champagne . Darrick Grinder, NP . Lyda Jester, PA . Audry Riles, PharmD   Please be sure to bring in all your medications bottles to every appointment.

## 2019-12-17 NOTE — Progress Notes (Signed)
[facility]ED [facility] CLINIC  NOTE   Primary Cardiologist: Jacinta Shoe  HPI:  Loretta Thomas is a 69 y.o. female with severe HTN, anxiety, LBBB and systolic HF with EF 16-10%. She has had multiple heart catheterizations over the past two decades: 2002 [facility] 2011 [facility] 2013 [facility]). All showed normal or minimal CAD.  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 No chemoRx or XRT. 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  Developed LV dysfunction in 2019 (I cannot find echo from before them either in our system or at [facility]).   Echo 10/22/17 revealed EF 15%, mild LVH, global HK, moderate MR. Underwent BivICD in 9/19.   Repeat echo 1/21 EF 20-25% with normal RV and underwent AV optimization  Had PYP scan 07/13/19 Strongly suggestive of TTR amyloidosis or (Strongly suggestive: A semi-quantitative visual score of 2 or 3 or H/CL ratio >1.5) - 1.8  She developed a rash with Entresto in 7/10  I saw her for the first time in 4/21 and felt she likely did not have TTR. Ordered repeat echo and sleep study. Started spiro 12.5  Echo 4/21 EF 25-30% Sleep study 4/21 AHI 7.1  Here for f/u. At last visit we started [place] and she says she feels so much better. Much more active now. Even did some dancing. No edema, orthopnea or PND. BP ok. NO ICD shocks   Past Medical History:  Diagnosis Date  . Anxiety   . Bladder cystocele   . Breast cancer ([place])    s/p L breast cancer removal  . Bundle branch block, left    diagnosed 03/2012 followed by cath showing only minimal CAD  . Bursitis   . Cancer ([place])    right breast  . Capsulitis   . Diverticulosis   . Elevated liver enzymes 2007  . History of IBS   . Hyperlipidemia   . Hypertension   . Nonischemic cardiomyopathy ([place])   . pernicious anemia     Current Outpatient Medications  Medication Sig Dispense Refill  . acetaminophen (TYLENOL) 500 MG  tablet Take 500 mg by mouth every 6 (six) hours as needed.     . ALPRAZolam (XANAX) 0.5 MG tablet Take 0.5 mg by mouth every 8 (eight) hours as needed. for pain    . Calcium Carbonate-Vitamin D 600-400 MG-UNIT tablet Take 1 tablet by mouth 2 (two) times daily.    . carvedilol (COREG) 3.125 MG tablet TAKE 1 TABLET (3.125 MG TOTAL) BY MOUTH 2 (TWO) TIMES DAILY WITH A MEAL. 60 tablet 6  . cetirizine (ZYRTEC) 10 MG tablet Take 10 mg by mouth daily.     . clemastine (TAVIST) 2.68 MG TABS tablet Take 2.68 mg by mouth 2 (two) times daily.    . cyanocobalamin (,VITAMIN B-12,) 1000 MCG/ML injection Inject 1 mL (1,000 mcg total) into the muscle every 30 (thirty) days. 10 mL 0  . dapagliflozin propanediol (FARXIGA) 10 MG TABS tablet Take 1 tablet (10 mg total) by mouth daily before breakfast. 30 tablet 5  . famotidine (PEPCID) 40 MG tablet Take 40 mg by mouth daily as needed.   6  . fluconazole (DIFLUCAN) 200 MG tablet Take 1 tablet (200 mg total) by mouth daily as needed. 10 tablet 0  . furosemide (LASIX) 40 MG tablet Take 1 tablet (40 mg total) by mouth every other day. 45 tablet 3  . letrozole (FEMARA) 2.5 MG tablet Take 1 tablet (2.5  mg total) by mouth daily. 90 tablet 3  . loratadine (CLARITIN) 10 MG tablet Take 10 mg by mouth daily as needed.     Marland Kitchen losartan (COZAAR) 50 MG tablet Take 1 tablet (50 mg total) by mouth daily. 90 tablet 2  . spironolactone (ALDACTONE) 25 MG tablet TAKE 1/2 TABLET BY MOUTH EVERY DAY 45 tablet 3  . triamcinolone cream (KENALOG) 0.1 % Apply 1 application topically 2 (two) times daily as needed for itching.    . venlafaxine XR (EFFEXOR-XR) 37.5 MG 24 hr capsule Take 1 capsule (37.5 mg total) by mouth daily with breakfast. 90 capsule 1  . Vitamin D, Ergocalciferol, (DRISDOL) 50000 UNITS CAPS capsule Take 50,000 Units by mouth every 7 (seven) days. Fridays    . zolpidem (AMBIEN) 10 MG tablet Take 10 mg by mouth at bedtime.     No current facility-administered medications for  this encounter.    Allergies  Allergen Reactions  . Codeine     Stomach Pains  . Demerol [Meperidine]     Upset stomach  . Hydrocodone   . Lortab [Hydrocodone-Acetaminophen]     Upset stomach      Social History   Socioeconomic History  . Marital status: Married    Spouse name: Not on file  . Number of children: Not on file  . Years of education: Not on file  . Highest education level: Not on file  Occupational History  . Not on file  Tobacco Use  . Smoking status: Never Smoker  . Smokeless tobacco: Never Used  Vaping Use  . Vaping Use: Never used  Substance and Sexual Activity  . Alcohol use: No  . Drug use: No  . Sexual activity: Yes  Other Topics Concern  . Not on file  Social History Narrative   Lives in [place] Determinants of Health   Financial Resource Strain:   . Difficulty of Paying Living Expenses: Not on file  Food Insecurity:   . Worried About Charity fundraiser in the Last Year: Not on file  . Ran Out of Food in the Last Year: Not on file  Transportation Needs:   . Lack of Transportation (Medical): Not on file  . Lack of Transportation (Non-Medical): Not on file  Physical Activity:   . Days of Exercise per Week: Not on file  . Minutes of Exercise per Session: Not on file  Stress:   . Feeling of Stress : Not on file  Social Connections:   . Frequency of Communication with Friends and Family: Not on file  . Frequency of Social Gatherings with Friends and Family: Not on file  . Attends Religious Services: Not on file  . Active Member of Clubs or Organizations: Not on file  . Attends Archivist Meetings: Not on file  . Marital Status: Not on file  Intimate Partner Violence:   . Fear of Current or Ex-Partner: Not on file  . Emotionally Abused: Not on file  . Physically Abused: Not on file  . Sexually Abused: Not on file      Family History  Problem Relation Age of Onset  . Cancer Sister        skin and colon x2  .  Cancer Brother        esophageal  . Cancer Father 48       mouth  . Cancer Maternal Aunt 49       breast  . Breast cancer Maternal Aunt 39  .  Cancer Maternal Grandmother 8       ovarian  . Cancer Cousin 66       mat 1st cousin with breast  . Breast cancer Cousin 91  . Cancer Paternal Grandfather 54       lung    Vitals:   12/17/19 1131  BP: (!) 142/76  Pulse: 90  SpO2: 97%  Weight: 85.7 kg (189 lb)    PHYSICAL EXAM: General:  Well appearing. No resp difficulty HEENT: normal Neck: supple. no JVD. Carotids 2+ bilat; no bruits. No lymphadenopathy or thryomegaly appreciated. Cor: PMI nondisplaced. Regular rate & rhythm. No rubs, gallops or murmurs. Lungs: clear Abdomen: obese soft, nontender, nondistended. No hepatosplenomegaly. No bruits or masses. Good bowel sounds. Extremities: no cyanosis, clubbing, rash, edema Neuro: alert & orientedx3, cranial nerves grossly intact. moves all 4 extremities w/o difficulty. Affect pleasant   ASSESSMENT & PLAN:  1. Chronic systolic HF  - due to presumed NICM - multiple cardiac caths with no CAD (last 2013) - EF 15% in 7/19 felt due to LBBB - s/p St Jude CRT-D in 9/19 - Echo 1/21 EF 25%. 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 SPEP negative (I though it was negative) - Volume status ok - Improved NYHA I-II - Continue losartan 50 daily  (rash with Entresto) - Increase carvedilol to 6.25 bid - Continue spiro 12.5 daily - Continue Farxiga - Change lasix 40 qod  - I have reviewed all studies including echo and PYP scan.  Feel PYP scan is equivocal and based on echo findings (completely normal RV) I do not think she has cardiac amyloid or at least not severe enough to cause this degree of LV dysfunction. She has undergone AV optimization - Myeloma panel negative. Genetic testing negative  - Will repeat echo and see if EF improving.  - Labs today  2. LBBB - s/p CRT-D - ICD interrogated in  clinic today. No VT/AF. Volume ok  3. Snoring - Very mild OSA on sleep study 4/21. AHI 7.1 - Followed by Dr. Radford Pax  4. Breast Cancer - treated with surgery and letrazole (no chemo or XRT)  Glori Bickers, MD  11:42 AM

## 2019-12-17 NOTE — Addendum Note (Signed)
Encounter addended by: Jolaine Artist, MD on: 12/17/2019 1:40 PM  Actions taken: Level of Service modified, Visit diagnoses modified, Charge Capture section accepted

## 2019-12-28 ENCOUNTER — Ambulatory Visit (INDEPENDENT_AMBULATORY_CARE_PROVIDER_SITE_OTHER): Payer: Federal, State, Local not specified - PPO

## 2019-12-28 DIAGNOSIS — I5022 Chronic systolic (congestive) heart failure: Secondary | ICD-10-CM | POA: Diagnosis not present

## 2019-12-28 DIAGNOSIS — Z95810 Presence of automatic (implantable) cardiac defibrillator: Secondary | ICD-10-CM | POA: Diagnosis not present

## 2020-01-01 NOTE — Progress Notes (Signed)
EPIC Encounter for ICM Monitoring  Patient Name: Loretta Thomas is a 69 y.o. female Date: 01/01/2020 Primary Care Physican: Rossie Muskrat, DO Primary Cardiologist:Bensimhon Electrophysiologist:Allred Bi-V Pacing:>99% 9/17/2021Weight:189lbs   Spoke with patient and reports feeling well at this time.  Denies fluid symptoms.    Corvue thoracic impedancesuggests normal fluid levels.  Prescribed:  Furosemide40 mg take 1 tabletevery other day.  Spironolactone 25 mg take 0.5 tablet (12.5mg  total)every other day  Labs: 09/30/2019 Creatinine 0.83, BUN 10, Potassium 4.1, Sodium 139, GFR >60 07/21/2019 Creatinine 0.77, BUN 9, Potassium 4.3, Sodium 140, GFR >60 A complete set of results can be found in Results Review.  Recommendations:No changes and encouraged to call if experiencing any fluid symptoms.    Follow-up plan: ICM clinic phone appointment on10/18/2021. 91 day device clinic remote transmission 01/05/2020.   EP/Cardiology Office Visits:03/17/2020 with Dr.Bensimhon. Recall for 06/25/2020 with Dr Rayann Heman.  Copy of ICM check sent to Dr.Allred.    3 month ICM trend: 12/28/2019    1 Year ICM trend:       [name], RN 01/01/2020 3:19 PM

## 2020-01-05 ENCOUNTER — Ambulatory Visit (INDEPENDENT_AMBULATORY_CARE_PROVIDER_SITE_OTHER): Payer: Federal, State, Local not specified - PPO | Admitting: *Deleted

## 2020-01-05 DIAGNOSIS — I428 Other cardiomyopathies: Secondary | ICD-10-CM

## 2020-01-05 LAB — CUP PACEART REMOTE DEVICE CHECK
Battery Remaining Longevity: 67 mo
Battery Remaining Percentage: 74 %
Battery Voltage: 2.98 V
Brady Statistic AP VP Percent: 1 %
Brady Statistic AP VS Percent: 1 %
Brady Statistic AS VP Percent: 99 %
Brady Statistic AS VS Percent: 1 %
Brady Statistic RA Percent Paced: 1 %
Date Time Interrogation Session: 20210921020019
HighPow Impedance: 70 Ohm
HighPow Impedance: 70 Ohm
Implantable Lead Implant Date: 20190920
Implantable Lead Implant Date: 20190920
Implantable Lead Implant Date: 20190920
Implantable Lead Location: 753858
Implantable Lead Location: 753859
Implantable Lead Location: 753860
Implantable Pulse Generator Implant Date: 20190920
Lead Channel Impedance Value: 1225 Ohm
Lead Channel Impedance Value: 390 Ohm
Lead Channel Impedance Value: 550 Ohm
Lead Channel Pacing Threshold Amplitude: 0.5 V
Lead Channel Pacing Threshold Amplitude: 0.625 V
Lead Channel Pacing Threshold Amplitude: 1.125 V
Lead Channel Pacing Threshold Pulse Width: 0.5 ms
Lead Channel Pacing Threshold Pulse Width: 0.5 ms
Lead Channel Pacing Threshold Pulse Width: 0.5 ms
Lead Channel Sensing Intrinsic Amplitude: 11.8 mV
Lead Channel Sensing Intrinsic Amplitude: 3.1 mV
Lead Channel Setting Pacing Amplitude: 2 V
Lead Channel Setting Pacing Amplitude: 2 V
Lead Channel Setting Pacing Amplitude: 2.125
Lead Channel Setting Pacing Pulse Width: 0.5 ms
Lead Channel Setting Pacing Pulse Width: 0.5 ms
Lead Channel Setting Sensing Sensitivity: 0.5 mV
Pulse Gen Serial Number: 9833194

## 2020-01-06 NOTE — Progress Notes (Signed)
Remote ICD transmission.   

## 2020-01-08 ENCOUNTER — Telehealth (HOSPITAL_COMMUNITY): Payer: Self-pay | Admitting: Cardiology

## 2020-01-08 NOTE — Telephone Encounter (Signed)
Sulfamethoxazole/TMP can enhance the hyperkalemic effect of spironolactone. Given that this patient has normal renal function and her potassium levels are always in the 4.0-4.3 range, there should be no concern for interaction between the  sulfamethoxazole/TMP and spironolactone. Called patient and informed her she may start the antibiotic.  Audry Riles, PharmD, BCPS, BCCP, CPP Heart Failure Clinic Pharmacist [phone]

## 2020-01-08 NOTE — Telephone Encounter (Signed)
Patient was given sulfamethoxazole/TMP for uti and sinus infection. When she picked up medication she was told antibiotic could have a serious interaction with spironolactone. Patient would like to be sure she is ok to start antibiotic.  Advised would forward to HF pharmacist for review

## 2020-01-18 ENCOUNTER — Telehealth (HOSPITAL_COMMUNITY): Payer: Self-pay | Admitting: *Deleted

## 2020-01-18 ENCOUNTER — Other Ambulatory Visit: Payer: Self-pay | Admitting: *Deleted

## 2020-01-18 DIAGNOSIS — C50411 Malignant neoplasm of upper-outer quadrant of right female breast: Secondary | ICD-10-CM

## 2020-01-18 MED ORDER — VENLAFAXINE HCL ER 37.5 MG PO CP24: 37.5000 mg | ORAL_CAPSULE | Freq: Every day | ORAL | 1 refills | Status: DC

## 2020-01-18 NOTE — Telephone Encounter (Signed)
Cut back to previous dose

## 2020-01-18 NOTE — Telephone Encounter (Signed)
Pt called stating since increasing carvedilol she has been tired and had no energy. Pt thought it was her UTI causing the tiredness but realized she's been feeling weak since increasing carvedilol to 6.25mg  bid. Pt said her bp has been in the 110s and heart rate 70s.  Routed to [place] for advice

## 2020-01-19 MED ORDER — CARVEDILOL 3.125 MG PO TABS: 3.1250 mg | ORAL_TABLET | Freq: Two times a day (BID) | ORAL | 3 refills | Status: DC

## 2020-01-19 NOTE — Telephone Encounter (Signed)
Pt aware.

## 2020-01-28 ENCOUNTER — Telehealth: Payer: Self-pay | Admitting: Cardiology

## 2020-01-28 NOTE — Telephone Encounter (Signed)
Patient had a sleep apnea order sent to a company that said they don't service [facility]. Was supposed to be sent to [facility], patient called to check for it and they did not have an order for it. Please advise.

## 2020-02-01 ENCOUNTER — Ambulatory Visit (INDEPENDENT_AMBULATORY_CARE_PROVIDER_SITE_OTHER): Payer: Federal, State, Local not specified - PPO

## 2020-02-01 DIAGNOSIS — I5022 Chronic systolic (congestive) heart failure: Secondary | ICD-10-CM

## 2020-02-01 DIAGNOSIS — Z95810 Presence of automatic (implantable) cardiac defibrillator: Secondary | ICD-10-CM | POA: Diagnosis not present

## 2020-02-01 NOTE — Telephone Encounter (Signed)
Patient called to say she never received her cpap which was sent 10/12/19 to [facility] in [place]. Patient was busy moving to another location and just forgot to call and inform us she never received her unit. I reached out to the patient and got her physical address then I called [facility] at [place], [place] who states they never received the order. I have re- faxed all paper work again the first time being 10-23-19. Confirmed fax received by [place].

## 2020-02-02 ENCOUNTER — Telehealth: Payer: Self-pay

## 2020-02-02 NOTE — Telephone Encounter (Signed)
Remote ICM transmission received.  Attempted call to patient regarding ICM remote transmission and no answer.  

## 2020-02-02 NOTE — Progress Notes (Signed)
EPIC Encounter for ICM Monitoring  Patient Name: Loretta Thomas is a 69 y.o. female Date: 02/02/2020 Primary Care Physican: Rossie Muskrat, DO Primary Cardiologist:Bensimhon Electrophysiologist:Allred Bi-V Pacing:>99% 9/17/2021Weight:189lbs   Attempted call to patient and unable to reach.  Left detailed message per DPR regarding transmission. Transmission reviewed.   Corvue thoracic impedancesuggesting possible fluid accumulation from 10/9 and returned to baseline on 10/18  Prescribed:  Furosemide40 mg take 1 tabletevery other day.  Spironolactone 25 mg take 0.5 tablet (12.5mg  total)every other day  Labs: 09/30/2019 Creatinine 0.83, BUN 10, Potassium 4.1, Sodium 139, GFR >60 07/21/2019 Creatinine 0.77, BUN 9, Potassium 4.3, Sodium 140, GFR >60 A complete set of results can be found in Results Review.  Recommendations:Unable to reach.    Follow-up plan: ICM clinic phone appointment on11/22/2021. 91 day device clinic remote transmission 04/05/2020.   EP/Cardiology Office Visits:03/17/2020 with Dr.Bensimhon.Recall for 06/25/2020 with Dr Rayann Heman.  Copy of ICM check sent to Dr.Allred.   3 month ICM trend: 02/01/2020    1 Year ICM trend:       [name], RN 02/02/2020 12:29 PM

## 2020-02-02 NOTE — Progress Notes (Signed)
Patient returned call and stated she had been drinking a lot of extra fluid since she had a UTI and taking antibiotics.  She is feeling fine and denies any fluid symptoms.  No changes today. Encouraged to limit fluid intake since UTI has resolved.

## 2020-02-15 ENCOUNTER — Other Ambulatory Visit: Payer: Self-pay | Admitting: *Deleted

## 2020-02-15 MED ORDER — LOSARTAN POTASSIUM 50 MG PO TABS: 50.0000 mg | ORAL_TABLET | Freq: Every day | ORAL | 0 refills | Status: DC

## 2020-02-26 ENCOUNTER — Telehealth: Payer: Self-pay

## 2020-02-26 NOTE — Telephone Encounter (Signed)
Spoke with patient.  Advised transmission reviewed and no abnomal episodes listed.  She described the episode she had yesterday was a sensation in chest, nausea, and shortness of breath that lasted for several mintues and has not returned.  Advised device report will not show if she is having heart attack so if she feels she may be experiencing any of those type of symptoms to call 911 or go to ER.  She is feeling fine today.

## 2020-02-26 NOTE — Telephone Encounter (Signed)
Returned patient call as requested by voice mail message.  She was not feeling well yesterday between 8:30 AM - 10:00 AM and asked if there was any episodes showing on her device. Advised no alerts were received yesterday or today.  Requested she send a remote transmission for review.  She will send later today because she is not home right now.  Will call back after remote is received and reviewed.

## 2020-03-07 ENCOUNTER — Ambulatory Visit (INDEPENDENT_AMBULATORY_CARE_PROVIDER_SITE_OTHER): Payer: Federal, State, Local not specified - PPO

## 2020-03-07 DIAGNOSIS — Z95810 Presence of automatic (implantable) cardiac defibrillator: Secondary | ICD-10-CM

## 2020-03-07 DIAGNOSIS — I5022 Chronic systolic (congestive) heart failure: Secondary | ICD-10-CM | POA: Diagnosis not present

## 2020-03-09 NOTE — Progress Notes (Signed)
EPIC Encounter for ICM Monitoring  Patient Name: Loretta Thomas is a 69 y.o. female Date: 03/09/2020 Primary Care Physican: Rossie Muskrat, DO Primary Cardiologist:Bensimhon Electrophysiologist:Allred Bi-V Pacing:>99% 11/24/2021Weight:189-190lbs   Spoke with patient and reports feeling well at this time.  Denies fluid symptoms.  She is concerned about her cholesterol and blood sugars which she will discuss with Dr Haroldine Laws at next [place].  Corvue thoracic impedancesuggesting normal fluid levels.  Prescribed:  Furosemide40 mg take 1 tabletevery other day.  Spironolactone 25 mg take 0.5 tablet (12.5mg  total)every other day  Labs: 09/30/2019 Creatinine 0.83, BUN 10, Potassium 4.1, Sodium 139, GFR >60 07/21/2019 Creatinine 0.77, BUN 9, Potassium 4.3, Sodium 140, GFR >60 A complete set of results can be found in Results Review.  Recommendations:No changes and encouraged to call if experiencing any fluid symptoms.  Follow-up plan: ICM clinic phone appointment on1/06/2020. 91 day device clinic remote transmission 04/05/2020.   EP/Cardiology Office Visits:03/17/2020 with Dr.Bensimhon.Recall for 06/25/2020 with Dr Rayann Heman.  Copy of ICM check sent to Dr.Allred.   3 month ICM trend: 03/07/2020    1 Year ICM trend:       [name], RN 03/09/2020 8:29 AM

## 2020-03-14 ENCOUNTER — Other Ambulatory Visit (HOSPITAL_COMMUNITY): Payer: Self-pay | Admitting: Internal Medicine

## 2020-03-17 ENCOUNTER — Ambulatory Visit (HOSPITAL_COMMUNITY)
Admission: RE | Admit: 2020-03-17 | Discharge: 2020-03-17 | Disposition: A | Discharge status: Home / Self Care | Payer: Federal, State, Local not specified - PPO | Source: Ambulatory Visit | Attending: Family Medicine | Admitting: Family Medicine

## 2020-03-17 ENCOUNTER — Other Ambulatory Visit: Payer: Self-pay

## 2020-03-17 ENCOUNTER — Telehealth: Payer: Self-pay | Admitting: Internal Medicine

## 2020-03-17 ENCOUNTER — Encounter (HOSPITAL_COMMUNITY): Payer: Self-pay | Admitting: Internal Medicine

## 2020-03-17 ENCOUNTER — Ambulatory Visit (HOSPITAL_BASED_OUTPATIENT_CLINIC_OR_DEPARTMENT_OTHER)
Admission: RE | Admit: 2020-03-17 | Discharge: 2020-03-17 | Disposition: A | Discharge status: Home / Self Care | Payer: Federal, State, Local not specified - PPO | Source: Ambulatory Visit | Attending: Internal Medicine | Admitting: Internal Medicine

## 2020-03-17 VITALS — BP 132/78 | HR 78 | Wt 191.0 lb

## 2020-03-17 DIAGNOSIS — I110 Hypertensive heart disease with heart failure: Secondary | ICD-10-CM | POA: Diagnosis present

## 2020-03-17 DIAGNOSIS — I447 Left bundle-branch block, unspecified: Secondary | ICD-10-CM | POA: Diagnosis not present

## 2020-03-17 DIAGNOSIS — I10 Essential (primary) hypertension: Secondary | ICD-10-CM

## 2020-03-17 DIAGNOSIS — Z79899 Other long term (current) drug therapy: Secondary | ICD-10-CM | POA: Diagnosis not present

## 2020-03-17 DIAGNOSIS — I519 Heart disease, unspecified: Secondary | ICD-10-CM

## 2020-03-17 DIAGNOSIS — I2510 Atherosclerotic heart disease of native coronary artery without angina pectoris: Secondary | ICD-10-CM | POA: Insufficient documentation

## 2020-03-17 DIAGNOSIS — Z7984 Long term (current) use of oral hypoglycemic drugs: Secondary | ICD-10-CM | POA: Insufficient documentation

## 2020-03-17 DIAGNOSIS — Z9011 Acquired absence of right breast and nipple: Secondary | ICD-10-CM | POA: Insufficient documentation

## 2020-03-17 DIAGNOSIS — F419 Anxiety disorder, unspecified: Secondary | ICD-10-CM | POA: Diagnosis not present

## 2020-03-17 DIAGNOSIS — Z8719 Personal history of other diseases of the digestive system: Secondary | ICD-10-CM | POA: Diagnosis not present

## 2020-03-17 DIAGNOSIS — I5022 Chronic systolic (congestive) heart failure: Secondary | ICD-10-CM | POA: Diagnosis not present

## 2020-03-17 DIAGNOSIS — R0683 Snoring: Secondary | ICD-10-CM | POA: Diagnosis not present

## 2020-03-17 DIAGNOSIS — Z885 Allergy status to narcotic agent status: Secondary | ICD-10-CM | POA: Diagnosis not present

## 2020-03-17 DIAGNOSIS — G4733 Obstructive sleep apnea (adult) (pediatric): Secondary | ICD-10-CM | POA: Insufficient documentation

## 2020-03-17 DIAGNOSIS — E785 Hyperlipidemia, unspecified: Secondary | ICD-10-CM | POA: Insufficient documentation

## 2020-03-17 DIAGNOSIS — Z853 Personal history of malignant neoplasm of breast: Secondary | ICD-10-CM | POA: Diagnosis not present

## 2020-03-17 HISTORY — DX: Heart failure, unspecified: I50.9

## 2020-03-17 LAB — ECHOCARDIOGRAM COMPLETE
Area-P 1/2: 6.12 cm2
Calc EF: 31.5 %
S' Lateral: 4.6 cm
Single Plane A2C EF: 30.1 %
Single Plane A4C EF: 34.7 %

## 2020-03-17 MED ORDER — LOSARTAN POTASSIUM 50 MG PO TABS: 50.0000 mg | ORAL_TABLET | Freq: Every day | ORAL | 3 refills | Status: DC

## 2020-03-17 MED ORDER — SPIRONOLACTONE 25 MG PO TABS: 25.0000 mg | ORAL_TABLET | Freq: Every day | ORAL | 3 refills | Status: DC

## 2020-03-17 NOTE — Progress Notes (Addendum)
ADVANCED HF CLINIC  NOTE    Primary Cardiologist: Jacinta Shoe  HPI: Natalin Bible is a 69 y.o. female with severe HTN, anxiety, LBBB and systolic HF with EF 83-41%. She has had multiple heart catheterizations over the past two decades: 2002 [facility]), 2011 [facility]), 2013 [facility]). All showed normal or minimal CAD.  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 No chemoRx or XRT. 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  Developed LV dysfunction in 2019 (I cannot find echo from before them either in our system or at [facility]).   Echo 10/22/17 revealed EF 15%, mild LVH, global HK, moderate MR. Underwent BivICD in 9/19.   Repeat echo 1/21 EF 20-25% with normal RV and underwent AV optimization  Had PYP scan 07/13/19 Strongly suggestive of TTR amyloidosis or (Strongly suggestive: A semi-quantitative visual score of 2 or 3 or H/CL ratio >1.5) - 1.8  She developed a rash with Entresto in 7/10  Dr Haroldine Laws saw her for the first time in 4/21 and felt she likely did not have TTR. Ordered repeat echo and sleep study. Started spiro 12.5  Echo 4/21 EF 25-30% Sleep study 4/21 AHI 7.1  Today she returns for HF follow up.Last visit coreg was increased but she did not tolerate due to fatigue. Overall feeling fine. Mild SOB with exertion. Denies PND/Orthopnea. Appetite ok. No fever or chills. Weight at home 188-189  pounds. Taking all medications.  ECHO today EF 25-30%.   Labs from PCP 10/202021  Hgb A1C 5.5 Cholesterol 234 LDL 153 HDL 42 TG 230   Past Medical History:  Diagnosis Date  . Anxiety   . Bladder cystocele   . Breast cancer ([place])    s/p L breast cancer removal  . Bundle branch block, left    diagnosed 03/2012 followed by cath showing only minimal CAD  . Bursitis   . Cancer ([place])    right breast  . Capsulitis   . CHF (congestive heart failure) ([place])   . Diverticulosis   . Elevated  liver enzymes 2007  . History of IBS   . Hyperlipidemia   . Hypertension   . Nonischemic cardiomyopathy ([place])   . pernicious anemia     Current Outpatient Medications  Medication Sig Dispense Refill  . acetaminophen (TYLENOL) 500 MG tablet Take 500 mg by mouth every 6 (six) hours as needed.     . ALPRAZolam (XANAX) 0.5 MG tablet Take 0.5 mg by mouth every 8 (eight) hours as needed. for pain    . Calcium Carbonate-Vitamin D 600-400 MG-UNIT tablet Take 1 tablet by mouth 2 (two) times daily.    . carvedilol (COREG) 3.125 MG tablet Take 1 tablet (3.125 mg total) by mouth 2 (two) times daily with a meal. 60 tablet 3  . cetirizine (ZYRTEC) 10 MG tablet Take 10 mg by mouth daily.     . cyanocobalamin (,VITAMIN B-12,) 1000 MCG/ML injection Inject 1 mL (1,000 mcg total) into the muscle every 30 (thirty) days. 10 mL 0  . dapagliflozin propanediol (FARXIGA) 10 MG TABS tablet Take 1 tablet (10 mg total) by mouth daily before breakfast. 30 tablet 5  . famotidine (PEPCID) 40 MG tablet Take 40 mg by mouth daily as needed.   6  . fluconazole (DIFLUCAN) 200 MG tablet Take 1 tablet (200 mg total) by mouth daily as needed. 10 tablet 0  . furosemide (LASIX) 40 MG tablet Take 1 tablet (40  mg total) by mouth every other day. 45 tablet 3  . letrozole (FEMARA) 2.5 MG tablet Take 1 tablet (2.5 mg total) by mouth daily. 90 tablet 3  . loratadine (CLARITIN) 10 MG tablet Take 10 mg by mouth daily as needed.     Marland Kitchen losartan (COZAAR) 50 MG tablet Take 1 tablet (50 mg total) by mouth daily. 90 tablet 0  . spironolactone (ALDACTONE) 25 MG tablet TAKE 1/2 TABLET BY MOUTH EVERY DAY 45 tablet 3  . triamcinolone cream (KENALOG) 0.1 % Apply 1 application topically 2 (two) times daily as needed for itching.    . venlafaxine XR (EFFEXOR-XR) 37.5 MG 24 hr capsule Take 1 capsule (37.5 mg total) by mouth daily with breakfast. 90 capsule 1  . Vitamin D, Ergocalciferol, (DRISDOL) 50000 UNITS CAPS capsule Take 50,000 Units by mouth  every 7 (seven) days. Fridays    . zolpidem (AMBIEN) 10 MG tablet Take 10 mg by mouth as needed for sleep.     No current facility-administered medications for this encounter.    Allergies  Allergen Reactions  . Codeine     Stomach Pains  . Demerol [Meperidine]     Upset stomach  . Hydrocodone   . Lortab [Hydrocodone-Acetaminophen]     Upset stomach      Social History   Socioeconomic History  . Marital status: Married    Spouse name: Not on file  . Number of children: Not on file  . Years of education: Not on file  . Highest education level: Not on file  Occupational History  . Not on file  Tobacco Use  . Smoking status: Never Smoker  . Smokeless tobacco: Never Used  Vaping Use  . Vaping Use: Never used  Substance and Sexual Activity  . Alcohol use: No  . Drug use: No  . Sexual activity: Yes  Other Topics Concern  . Not on file  Social History Narrative   Lives in [place] Determinants of Health   Financial Resource Strain:   . Difficulty of Paying Living Expenses: Not on file  Food Insecurity:   . Worried About Charity fundraiser in the Last Year: Not on file  . Ran Out of Food in the Last Year: Not on file  Transportation Needs:   . Lack of Transportation (Medical): Not on file  . Lack of Transportation (Non-Medical): Not on file  Physical Activity:   . Days of Exercise per Week: Not on file  . Minutes of Exercise per Session: Not on file  Stress:   . Feeling of Stress : Not on file  Social Connections:   . Frequency of Communication with Friends and Family: Not on file  . Frequency of Social Gatherings with Friends and Family: Not on file  . Attends Religious Services: Not on file  . Active Member of Clubs or Organizations: Not on file  . Attends Archivist Meetings: Not on file  . Marital Status: Not on file  Intimate Partner Violence:   . Fear of Current or Ex-Partner: Not on file  . Emotionally Abused: Not on file  .  Physically Abused: Not on file  . Sexually Abused: Not on file      Family History  Problem Relation Age of Onset  . Cancer Sister        skin and colon x2  . Cancer Brother        esophageal  . Cancer Father 15  mouth  . Cancer Maternal Aunt 63       breast  . Breast cancer Maternal Aunt 1  . Cancer Maternal Grandmother 62       ovarian  . Cancer Cousin 39       mat 1st cousin with breast  . Breast cancer Cousin 59  . Cancer Paternal Grandfather 38       lung    Vitals:   03/17/20 1235  BP: 132/78  Pulse: 78  SpO2: 96%  Weight: 86.6 kg (191 lb)    PHYSICAL EXAM: General:  Well appearing. No resp difficulty HEENT: normal Neck: supple. no JVD. Carotids 2+ bilat; no bruits. No lymphadenopathy or thryomegaly appreciated. Cor: PMI nondisplaced. Regular rate & rhythm. No rubs, gallops or murmurs. Lungs: clear Abdomen: obese soft, nontender, nondistended. No hepatosplenomegaly. No bruits or masses. Good bowel sounds. Extremities: no cyanosis, clubbing, rash, edema Neuro: alert & orientedx3, cranial nerves grossly intact. moves all 4 extremities w/o difficulty. Affect pleasant  EKG: A sensed V paced 82 bpm  (qrs 146ms)  ASSESSMENT & PLAN:  1. Chronic systolic HF  - due to presumed NICM - multiple cardiac caths with no CAD (last 2013) - EF 15% in 7/19 felt due to LBBB - s/p St Jude CRT-D in 9/19 - Echo 1/21 EF 25%. 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 SPEP negative (I though it was negative) - ECHO today EF 25-30%.  - NYHA II-early III. Volume status stable and appears stable on interrogation.  - Continue losartan 50 daily  (rash with Entresto) - Continue carvedilol 3.125 mg twice a day. Intolerant increased due to fatigue.  - Increase spiro 25 mg daily. Check BMET in 7 days.  - Continue Farxiga 10 mg daily.  - Change lasix 40 qod  - Feel PYP scan is equivocal and based on echo findings (completely normal RV) I do  not think she has cardiac amyloid or at least not severe enough to cause this degree of LV dysfunction. She has undergone AV optimization - Myeloma panel negative. Genetic testing negative   2. LBBB - s/p CRT-D - Interrogation nterrogated in clinic today. No VT/AF. Volume ok  3. Snoring - Very mild OSA on sleep study 4/21. AHI 7.1 Oxygen sats 83% Dr Radford Pax reviewing.   4. Breast Cancer - treated with surgery and letrazole (no chemo or XRT)  5. Hyperlipdemia  From PCP 01/2020 Cholesterol 234 LDL 153 HDL 42TG 230  Refer back to cardiology at [facility]. In the past she has trouble with statins.   Follow up in 2-3 months.   Darrick Grinder, NP  12:58 PM  Patient seen and examined with the above-signed Advanced Practice Provider and/or Housestaff. I personally reviewed laboratory data, imaging studies and relevant notes. I independently examined the patient and formulated the important aspects of the plan. I have edited the note to reflect any of my changes or salient points. I have personally discussed the plan with the patient and/or family.  She is doing well. NYHA II-early III. Volume status recently elevated but now back down. ECHO today EF stable at 30% QRS 138ms  General:  Well appearing. No resp difficulty HEENT: normal Neck: supple. no JVD. Carotids 2+ bilat; no bruits. No lymphadenopathy or thryomegaly appreciated. Cor: PMI nondisplaced. Regular rate & rhythm. No rubs, gallops or murmurs. Lungs: clear Abdomen: soft, nontender, nondistended. No hepatosplenomegaly. No bruits or masses. Good bowel sounds. Extremities: no cyanosis,  clubbing, rash, edema Neuro: alert & orientedx3, cranial nerves grossly intact. moves all 4 extremities w/o difficulty. Affect pleasant  Overall stable. NYHA II-early III. Volume ok. Echo stable. Increase spiro. Labs next week. ICD interrogated personally. Refer back to [facility] to manage lipids.   Glori Bickers, MD  1:31 PM

## 2020-03-17 NOTE — Telephone Encounter (Signed)
Patient called stating that she was seen by Dr. Haroldine Laws on 03/17/2020. Was told to start following up with our Cardiology doctors due to high Cholesterol . First opening for [facility] (which is where patient wants to come) is mid January.  Her question is should she wait until then to be seen.

## 2020-03-17 NOTE — Patient Instructions (Signed)
Increase Spironolactone to 25 mg (1 tab) Daily  Your physician recommends that you return for lab work in: 1 week, this can be done locally  Please call our office in March 2022 to schedule your follow up appointment  If you have any questions or concerns before your next appointment please send [place] a message through [place] or call our office at [phone].    TO LEAVE A MESSAGE FOR THE NURSE SELECT OPTION 2, PLEASE LEAVE A MESSAGE INCLUDING: . YOUR NAME . DATE OF BIRTH . CALL BACK NUMBER . REASON FOR CALL**this is important as we prioritize the call backs  [place] AS LONG AS YOU CALL BEFORE 4:00 PM  At the [facility], you and your health needs are our priority. As part of our continuing mission to provide you with exceptional heart care, we have created designated Provider Care Teams. These Care Teams include your primary Cardiologist (physician) and Advanced Practice Providers (APPs- Physician Assistants and Nurse Practitioners) who all work together to provide you with the care you need, when you need it.   You may see any of the following providers on your designated Care Team at your next follow up: Marland Kitchen Dr Glori Bickers . Dr Loralie Champagne . Darrick Grinder, NP . Lyda Jester, PA . Audry Riles, PharmD   Please be sure to bring in all your medications bottles to every appointment.

## 2020-03-17 NOTE — Progress Notes (Signed)
  Echocardiogram 2D Echocardiogram has been performed.  Loretta Thomas 03/17/2020, 11:53 AM

## 2020-03-17 NOTE — Telephone Encounter (Signed)
Yes can wait until January

## 2020-03-23 ENCOUNTER — Other Ambulatory Visit (HOSPITAL_COMMUNITY): Payer: Self-pay | Admitting: Internal Medicine

## 2020-03-29 ENCOUNTER — Other Ambulatory Visit (HOSPITAL_COMMUNITY): Payer: Self-pay | Admitting: Internal Medicine

## 2020-04-05 ENCOUNTER — Ambulatory Visit (INDEPENDENT_AMBULATORY_CARE_PROVIDER_SITE_OTHER): Payer: Federal, State, Local not specified - PPO

## 2020-04-05 DIAGNOSIS — I5022 Chronic systolic (congestive) heart failure: Secondary | ICD-10-CM | POA: Diagnosis not present

## 2020-04-05 DIAGNOSIS — I428 Other cardiomyopathies: Secondary | ICD-10-CM

## 2020-04-05 LAB — CUP PACEART REMOTE DEVICE CHECK
Battery Remaining Longevity: 66 mo
Battery Remaining Percentage: 71 %
Battery Voltage: 2.96 V
Brady Statistic AP VP Percent: 1 %
Brady Statistic AP VS Percent: 1 %
Brady Statistic AS VP Percent: 99 %
Brady Statistic AS VS Percent: 1 %
Brady Statistic RA Percent Paced: 1 %
Date Time Interrogation Session: 20211221020018
HighPow Impedance: 87 Ohm
HighPow Impedance: 87 Ohm
Implantable Lead Implant Date: 20190920
Implantable Lead Implant Date: 20190920
Implantable Lead Implant Date: 20190920
Implantable Lead Location: 753858
Implantable Lead Location: 753859
Implantable Lead Location: 753860
Implantable Pulse Generator Implant Date: 20190920
Lead Channel Impedance Value: 1225 Ohm
Lead Channel Impedance Value: 440 Ohm
Lead Channel Impedance Value: 610 Ohm
Lead Channel Pacing Threshold Amplitude: 0.5 V
Lead Channel Pacing Threshold Amplitude: 0.625 V
Lead Channel Pacing Threshold Amplitude: 1 V
Lead Channel Pacing Threshold Pulse Width: 0.5 ms
Lead Channel Pacing Threshold Pulse Width: 0.5 ms
Lead Channel Pacing Threshold Pulse Width: 0.5 ms
Lead Channel Sensing Intrinsic Amplitude: 11.8 mV
Lead Channel Sensing Intrinsic Amplitude: 5 mV
Lead Channel Setting Pacing Amplitude: 2 V
Lead Channel Setting Pacing Amplitude: 2 V
Lead Channel Setting Pacing Amplitude: 2 V
Lead Channel Setting Pacing Pulse Width: 0.5 ms
Lead Channel Setting Pacing Pulse Width: 0.5 ms
Lead Channel Setting Sensing Sensitivity: 0.5 mV
Pulse Gen Serial Number: 9833194

## 2020-04-13 ENCOUNTER — Other Ambulatory Visit: Payer: Self-pay | Admitting: Hematology and Oncology

## 2020-04-13 DIAGNOSIS — Z1231 Encounter for screening mammogram for malignant neoplasm of breast: Secondary | ICD-10-CM

## 2020-04-14 ENCOUNTER — Telehealth (HOSPITAL_COMMUNITY): Payer: Self-pay | Admitting: *Deleted

## 2020-04-14 MED ORDER — SPIRONOLACTONE 25 MG PO TABS: 12.5000 mg | ORAL_TABLET | Freq: Every day | ORAL | 3 refills | Status: DC

## 2020-04-14 NOTE — Telephone Encounter (Signed)
Pt aware and agreeable with plan.  

## 2020-04-14 NOTE — Telephone Encounter (Signed)
OK to decrease to 12.5 mg daily. Watch out for weight increase and swelling.

## 2020-04-14 NOTE — Telephone Encounter (Signed)
Pt left VM stating she has no energy since increasing spironolactone to 25mg  daily. Pt asks that we decrease spironolactone to 1/2 tablet. Pt said this happened before when they increased her spironolactone and then had to decrease it due to no energy and fatigue.  Routed to for advice

## 2020-04-14 NOTE — Addendum Note (Signed)
Addended by: Modesta Messing on: 04/14/2020 01:35 PM   Modules accepted: Orders

## 2020-04-17 NOTE — Progress Notes (Deleted)
Cardiology Office Note  Date: 04/18/2020   ID: Loretta Thomas, DOB [birth year], MRN [id number]  PCP:  Chana Bode, DO  Cardiologist:  Prentice Docker, MD (Inactive) Electrophysiologist:  Hillis Range, MD   Chief Complaint: Chronic systolic heart failure  History of Present Illness: Loretta Thomas is a 70 y.o. female with a history of chronic systolic heart failure, HTN, HLD, nonischemic cardiomyopathy status post ICD, pernicious anemia, LBBB, breast cancer status post surgery, multiple cardiac catheterizations over 2 decades all showing normal or minimal CAD.  Developed LV dysfunction 2019: Echo 10/22/2017 with EF 15%, mild LVH, global HK, moderate MR.  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 Repeat echo April 2021 EF 25 to 30%.  Sleep study April 2021 apnea hypoapnea index 7.1.  She had previously been on Entresto but developed a rash.  Had previously started spironolactone 12.5 mg daily.    Prior encounter with Dr. Gala Romney 12/17/2019. At prior visit with Dr. Gala Romney she had been started on Farxiga and stated she felt much better.  Had no recent ICD shocks.  Improved NYHA class I-II symptoms.  Losartan was being continued at 50 mg daily.  Carvedilol increased to 6.25 mg p.o. twice daily.  PYP scan: Cardiac amyloid or at least not severe enough to cause the degree of LV dysfunction she was experiencing.  He ordered a repeat echo to see if EF was improving.  ICD was interrogated and no VT or AF noted.  Her volume status was good.  Very mild OSA on sleep study.  Previously treated with surgery and letrozole for her breast cancer.   Had recent follow-up with Tonye Becket, NP on 03/17/2020.  Her spironolactone was increased to 25 mg daily and basic metabolic panel was ordered.  Overall she was feeling fine.  Had some mild shortness of breath with exertion.  Denied any PND or  orthopnea.  Weight at home was around 188 to 189 pounds.  Carvedilol was decreased back to 3.125 mg p.o. twice daily due to complaints of fatigue.  Her device was interrogated and showed no VT or AF.  No evidence of volume overload.  Having trouble with statins.  Recent labs from PCP 01/2020 showed total cholesterol 34, LDL 153, HDL 42, triglycerides 230.  Needs a BMP?  Referral to lipid clinic?  Follow-up for elevated cholesterol per Dr. Gala Romney  Past Medical History:  Diagnosis Date  . Anxiety   . Bladder cystocele   . Breast cancer ([facility])    s/p L breast cancer removal  . Bundle branch block, left    diagnosed 03/2012 followed by cath showing only minimal CAD  . Bursitis   . Cancer ([facility])    right breast  . Capsulitis   . CHF (congestive heart failure) ([facility])   . Diverticulosis   . Elevated liver enzymes 2007  . History of IBS   . Hyperlipidemia   . Hypertension   . Nonischemic cardiomyopathy ([facility])   . pernicious anemia     Past Surgical History:  Procedure Laterality Date  . ANKLE FRACTURE SURGERY    . BIV ICD INSERTION CRT-D N/A 01/03/2018    [facility] [employer] MP model [phone] (serial  Number [id number]) biventricular ICD for primary prevention of sudden death  . BREAST BIOPSY     right breast  . BREAST RECONSTRUCTION WITH PLACEMENT OF TISSUE EXPANDER AND FLEX HD (ACELLULAR  HYDRATED DERMIS) Right 01/28/2014   Procedure: RIGHT BREAST RECONSTRUCTION WITH PLACEMENT OF TISSUE EXPANDER;  Surgeon: Etter Sjogren, MD;  Location: [facility] OR;  Service: Plastics;  Laterality: Right;  . CARDIAC CATHETERIZATION     x3; 03/18/12 [facility]): 10-20% pLAD, <20% ostial LCX, 20% mPLA, EF 60%.    . CHOLECYSTECTOMY    . COLONOSCOPY     x4  . DILATION AND CURETTAGE OF UTERUS    . MASTECTOMY Right    malignant  . REDUCTION MAMMAPLASTY Left   . RHINOPLASTY    . SIMPLE MASTECTOMY WITH AXILLARY SENTINEL NODE BIOPSY Right 01/28/2014   Procedure: RIGHT TOTAL  MASTECTOMY WITH  RIGHT AXILLARY SENTINEL NODE BIOPSY;  Surgeon: Glenna Fellows, MD;  Location: [facility] OR;  Service: General;  Laterality: Right;  . TONSILLECTOMY AND ADENOIDECTOMY    . TUBAL LIGATION      Current Outpatient Medications  Medication Sig Dispense Refill  . acetaminophen (TYLENOL) 500 MG tablet Take 500 mg by mouth every 6 (six) hours as needed.     . ALPRAZolam (XANAX) 0.5 MG tablet Take 0.5 mg by mouth every 8 (eight) hours as needed. for pain    . Calcium Carbonate-Vitamin D 600-400 MG-UNIT tablet Take 1 tablet by mouth 2 (two) times daily.    . carvedilol (COREG) 3.125 MG tablet Take 1 tablet (3.125 mg total) by mouth 2 (two) times daily with a meal. 60 tablet 3  . cetirizine (ZYRTEC) 10 MG tablet Take 10 mg by mouth daily.     . cyanocobalamin (,VITAMIN B-12,) 1000 MCG/ML injection Inject 1 mL (1,000 mcg total) into the muscle every 30 (thirty) days. 10 mL 0  . famotidine (PEPCID) 40 MG tablet Take 40 mg by mouth daily as needed.   6  . FARXIGA 10 MG TABS tablet TAKE 1 TABLET (10 MG TOTAL) BY MOUTH DAILY BEFORE BREAKFAST. 30 tablet 5  . fluconazole (DIFLUCAN) 200 MG tablet Take 1 tablet (200 mg total) by mouth daily as needed. 10 tablet 0  . furosemide (LASIX) 40 MG tablet Take 1 tablet (40 mg total) by mouth every other day. 45 tablet 3  . letrozole (FEMARA) 2.5 MG tablet Take 1 tablet (2.5 mg total) by mouth daily. 90 tablet 3  . loratadine (CLARITIN) 10 MG tablet Take 10 mg by mouth daily as needed.     Marland Kitchen losartan (COZAAR) 50 MG tablet Take 1 tablet (50 mg total) by mouth daily. 90 tablet 3  . spironolactone (ALDACTONE) 25 MG tablet Take 0.5 tablets (12.5 mg total) by mouth daily. 90 tablet 3  . triamcinolone cream (KENALOG) 0.1 % Apply 1 application topically 2 (two) times daily as needed for itching.    . venlafaxine XR (EFFEXOR-XR) 37.5 MG 24 hr capsule Take 1 capsule (37.5 mg total) by mouth daily with breakfast. 90 capsule 1  . Vitamin D, Ergocalciferol, (DRISDOL)  50000 UNITS CAPS capsule Take 50,000 Units by mouth every 7 (seven) days. Fridays    . zolpidem (AMBIEN) 10 MG tablet Take 10 mg by mouth as needed for sleep.     No current facility-administered medications for this visit.   Allergies:  Codeine, Demerol [meperidine], Hydrocodone, and Lortab [hydrocodone-acetaminophen]   Social History: The patient  reports that she has never smoked. She has never used smokeless tobacco. She reports that she does not drink alcohol and does not use drugs.   Family History: The patient's family history includes Breast cancer (age of onset: 38) in her cousin; Breast  cancer (age of onset: 96) in her maternal aunt; Cancer in her brother and sister; Cancer (age of onset: 58) in her cousin; Cancer (age of onset: 3) in her maternal aunt; Cancer (age of onset: 44) in her maternal grandmother; Cancer (age of onset: 48) in her paternal grandfather; Cancer (age of onset: 24) in her father.   ROS:  Please see the history of present illness. Otherwise, complete review of systems is positive for none.  All other systems are reviewed and negative.   Physical Exam: VS:  There were no vitals taken for this visit., BMI There is no height or weight on file to calculate BMI.  Wt Readings from Last 3 Encounters:  03/17/20 191 lb (86.6 kg)  12/17/19 189 lb (85.7 kg)  10/12/19 188 lb (85.3 kg)    General: Patient appears comfortable at rest. HEENT: Conjunctiva and lids normal, oropharynx clear with moist mucosa. Neck: Supple, no elevated JVP or carotid bruits, no thyromegaly. Lungs: Clear to auscultation, nonlabored breathing at rest. Cardiac: Regular rate and rhythm, no S3 or significant systolic murmur, no pericardial rub. Abdomen: Soft, nontender, no hepatomegaly, bowel sounds present, no guarding or rebound. Extremities: No pitting edema, distal pulses 2+. Skin: Warm and dry. Musculoskeletal: No kyphosis. Neuropsychiatric: Alert and oriented x3, affect grossly  appropriate.  ECG:  {EKG/Telemetry Strips Reviewed:[id number]}  Recent Labwork: 07/21/2019: B Natriuretic Peptide 16.6 09/30/2019: BUN 10; Creatinine, Ser 0.83; Potassium 4.1; Sodium 139     Component Value Date/Time   CHOL 153 06/21/2014 1035   TRIG 150 (H) 06/21/2014 1035   HDL 46 06/21/2014 1035   CHOLHDL 3.3 06/21/2014 1035   VLDL 30 06/21/2014 1035   LDLCALC 77 06/21/2014 1035    Other Studies Reviewed Today:  Echocardiogram 03/17/2020 1. Left ventricular ejection fraction, by estimation, is 25 to 30%. The left ventricle has severely decreased function. The left ventricle demonstrates global hypokinesis. Left ventricular diastolic parameters are consistent with Grade I diastolic dysfunction (impaired relaxation). Elevated left atrial pressure. 2. Right ventricular systolic function is normal. The right ventricular size is normal. There is normal pulmonary artery systolic pressure. The estimated right ventricular systolic pressure is [id number] mmHg. 3. The mitral valve is normal in structure. Trivial mitral valve regurgitation. No evidence of mitral stenosis. 4. The aortic valve is normal in structure. Aortic valve regurgitation is not visualized. No aortic stenosis is present. 5. The inferior vena cava is normal in size with greater than 50% respiratory variability, suggesting right atrial pressure of 3 mmHg. Comparison(s): No significant change from prior study.   Assessment and Plan:  1. Chronic systolic heart failure ([place])   2. Nonischemic cardiomyopathy ([place])   3. Essential hypertension   4. AICD (automatic cardioverter/defibrillator) present    1. Chronic systolic heart failure (HCC) ***  2. Nonischemic cardiomyopathy (HCC) ***  3. Essential hypertension ***  4. AICD (automatic cardioverter/defibrillator) present ***  Medication Adjustments/Labs and Tests Ordered: Current medicines are reviewed at length with the patient today.  Concerns regarding medicines  are outlined above.   Disposition: Follow-up with ***  Signed, Levell July, NP 04/18/2020 8:10 AM    [place] at [facility] [place], [place],  [address] Phone: [phone]; Fax: [phone]

## 2020-04-18 ENCOUNTER — Ambulatory Visit (INDEPENDENT_AMBULATORY_CARE_PROVIDER_SITE_OTHER): Payer: Federal, State, Local not specified - PPO

## 2020-04-18 ENCOUNTER — Ambulatory Visit: Payer: Federal, State, Local not specified - PPO | Admitting: Family Medicine

## 2020-04-18 DIAGNOSIS — I10 Essential (primary) hypertension: Secondary | ICD-10-CM

## 2020-04-18 DIAGNOSIS — I428 Other cardiomyopathies: Secondary | ICD-10-CM

## 2020-04-18 DIAGNOSIS — I5022 Chronic systolic (congestive) heart failure: Secondary | ICD-10-CM | POA: Diagnosis not present

## 2020-04-18 DIAGNOSIS — Z95810 Presence of automatic (implantable) cardiac defibrillator: Secondary | ICD-10-CM | POA: Diagnosis not present

## 2020-04-20 NOTE — Progress Notes (Signed)
EPIC Encounter for ICM Monitoring  Patient Name: Loretta Thomas is a 70 y.o. female Date: 04/20/2020 Primary Care Physican: Chana Bode, DO Primary Cardiologist:Bensimhon Electrophysiologist:Allred Bi-V Pacing:>99% 11/24/2021Weight:189-190lbs   Spoke with patient and reports feeling well at this time.  Denies fluid symptoms.  Advised to fill out DPR form at 04/26/2020 OV.  Corvue thoracic impedancesuggesting normal fluid levels.  Prescribed:  Furosemide40 mg take 1 tabletevery other day.  Spironolactone 25 mg take 0.5 tablet (12.5mg  total)every other day  Labs: 09/30/2019 Creatinine 0.83, BUN 10, Potassium 4.1, Sodium 139, GFR >60 07/21/2019 Creatinine 0.77, BUN 9, Potassium 4.3, Sodium 140, GFR >60 A complete set of results can be found in Results Review.  Recommendations:No changes and encouraged to call if experiencing any fluid symptoms.  Follow-up plan: ICM clinic phone appointment on2/11/2020. 91 day device clinic remote transmission3/22/2022.   EP/Cardiology Office Visits:04/26/2020 with Rennis Harding, NP.Recall for 06/25/2020 with Dr Johney Frame.  Copy of ICM check sent to Dr.Allred.    3 month ICM trend: 04/18/2020.    1 Year ICM trend:       Karie Soda, RN 04/20/2020 3:49 PM

## 2020-04-20 NOTE — Progress Notes (Signed)
Remote ICD transmission.   

## 2020-04-25 NOTE — Progress Notes (Addendum)
Cardiology Office Note  Date: 04/26/2020   ID: Loretta Thomas, DOB 1950-05-16, MRN [id number]  PCP:  Rossie Muskrat, DO  Cardiologist:  No primary care provider on file. Electrophysiologist:  Thompson Grayer, MD   Chief Complaint: Chronic systolic heart failure  History of Present Illness: Loretta Thomas is a 70 y.o. female with a history of chronic systolic heart failure, HTN, HLD, nonischemic cardiomyopathy status post ICD, pernicious anemia, LBBB, breast cancer status post surgery, multiple cardiac catheterizations over 2 decades all showing normal or minimal CAD.  Developed LV dysfunction 2019: Echo 10/22/2017 with EF 15%, mild LVH, global HK, moderate MR.  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 Repeat echo April 2021 EF 25 to 30%.  Sleep study April 2021 apnea hypoapnea index 7.1.  She had previously been on Entresto but developed a rash.  Had previously started spironolactone 12.5 mg daily.    Prior encounter with Dr. Haroldine Laws 12/17/2019. At prior visit with Dr. Haroldine Laws she had been started on Farxiga and stated she felt much better.  Had no recent ICD shocks.  Improved NYHA class I-II symptoms.  Losartan was being continued at 50 mg daily.  Carvedilol increased to 6.25 mg p.o. twice daily.  PYP scan: Cardiac amyloid or at least not severe enough to cause the degree of LV dysfunction she was experiencing.  He ordered a repeat echo to see if EF was improving.  ICD was interrogated and no VT or AF noted.  Her volume status was good.  Very mild OSA on sleep study.  Previously treated with surgery and letrozole for her breast cancer.   Had recent follow-up with Darrick Grinder, NP on 03/17/2020.  Her spironolactone was increased to 25 mg daily and basic metabolic panel was ordered.  Overall she was feeling fine.  Had some mild shortness of breath with exertion.  Denied any PND or  orthopnea.  Weight at home was around 188 to 189 pounds.  Carvedilol was decreased back to 3.125 mg p.o. twice daily due to complaints of fatigue.  Her device was interrogated and showed no VT or AF.  No evidence of volume overload.  Having trouble with statins.  Recent labs from PCP 01/2020 showed total cholesterol 34, LDL 153, HDL 42, triglycerides 230.  She is here for follow-up secondary to elevated lipid levels.  Recently saw Dr. Haroldine Laws who recommended she follow-up with [place].  She denies any  issues today other than feeling significantly tired recently.  States she recently had a UTI which made her feel tired also.  She is wondering if she may have a recurrence.  Denies any anginal or exertional symptoms, palpitations or arrhythmias, orthostatic symptoms, CVA or TIA-like symptoms, PND, orthopnea, significant weight gain,, dyspnea/shortness of breath, lower extremity edema.  Past Medical History:  Diagnosis Date  . Anxiety   . Bladder cystocele   . Breast cancer ([place])    s/p L breast cancer removal  . Bundle branch block, left    diagnosed 03/2012 followed by cath showing only minimal CAD  . Bursitis   . Cancer ([place])    right breast  . Capsulitis   . CHF (congestive heart failure) ([place])   . Diverticulosis   . Elevated liver enzymes 2007  . History of IBS   . Hyperlipidemia   . Hypertension   . Nonischemic cardiomyopathy ([place])   . pernicious anemia  Past Surgical History:  Procedure Laterality Date  . ANKLE FRACTURE SURGERY    . BIV ICD INSERTION CRT-D N/A 01/03/2018    [facility] Assura MP model [phone] (serial  Number [id number]) biventricular ICD for primary prevention of sudden death  . BREAST BIOPSY     right breast  . BREAST RECONSTRUCTION WITH PLACEMENT OF TISSUE EXPANDER AND FLEX HD (ACELLULAR HYDRATED DERMIS) Right 01/28/2014   Procedure: RIGHT BREAST RECONSTRUCTION WITH PLACEMENT OF TISSUE EXPANDER;  Surgeon: Crissie Reese, MD;  Location: [place];   Service: Plastics;  Laterality: Right;  . CARDIAC CATHETERIZATION     x3; 03/18/12 [facility]): 10-20% pLAD, <20% ostial LCX, 20% mPLA, EF 60%.    . CHOLECYSTECTOMY    . COLONOSCOPY     x4  . DILATION AND CURETTAGE OF UTERUS    . MASTECTOMY Right    malignant  . REDUCTION MAMMAPLASTY Left   . RHINOPLASTY    . SIMPLE MASTECTOMY WITH AXILLARY SENTINEL NODE BIOPSY Right 01/28/2014   Procedure: RIGHT TOTAL MASTECTOMY WITH  RIGHT AXILLARY SENTINEL NODE BIOPSY;  Surgeon: Excell Seltzer, MD;  Location: [place];  Service: General;  Laterality: Right;  . TONSILLECTOMY AND ADENOIDECTOMY    . TUBAL LIGATION      Current Outpatient Medications  Medication Sig Dispense Refill  . acetaminophen (TYLENOL) 500 MG tablet Take 500 mg by mouth every 6 (six) hours as needed.     . ALPRAZolam (XANAX) 0.5 MG tablet Take 0.5 mg by mouth every 8 (eight) hours as needed. for pain    . Calcium Carbonate-Vitamin D 600-400 MG-UNIT tablet Take 1 tablet by mouth 2 (two) times daily.    . carvedilol (COREG) 3.125 MG tablet Take 1 tablet (3.125 mg total) by mouth 2 (two) times daily with a meal. 60 tablet 3  . cetirizine (ZYRTEC) 10 MG tablet Take 10 mg by mouth daily.     . cyanocobalamin (,VITAMIN B-12,) 1000 MCG/ML injection Inject 1 mL (1,000 mcg total) into the muscle every 30 (thirty) days. 10 mL 0  . famotidine (PEPCID) 40 MG tablet Take 40 mg by mouth daily as needed.   6  . FARXIGA 10 MG TABS tablet TAKE 1 TABLET (10 MG TOTAL) BY MOUTH DAILY BEFORE BREAKFAST. 30 tablet 5  . fluconazole (DIFLUCAN) 200 MG tablet Take 1 tablet (200 mg total) by mouth daily as needed. 10 tablet 0  . furosemide (LASIX) 40 MG tablet Take 1 tablet (40 mg total) by mouth every other day. 45 tablet 3  . letrozole (FEMARA) 2.5 MG tablet Take 1 tablet (2.5 mg total) by mouth daily. 90 tablet 3  . loratadine (CLARITIN) 10 MG tablet Take 10 mg by mouth daily as needed.     Marland Kitchen losartan (COZAAR) 50 MG tablet Take 1  tablet (50 mg total) by mouth daily. 90 tablet 3  . spironolactone (ALDACTONE) 25 MG tablet Take 0.5 tablets (12.5 mg total) by mouth daily. 90 tablet 3  . triamcinolone cream (KENALOG) 0.1 % Apply 1 application topically 2 (two) times daily as needed for itching.    . venlafaxine XR (EFFEXOR-XR) 37.5 MG 24 hr capsule Take 1 capsule (37.5 mg total) by mouth daily with breakfast. 90 capsule 1  . Vitamin D, Ergocalciferol, (DRISDOL) 50000 UNITS CAPS capsule Take 50,000 Units by mouth every 7 (seven) days. Fridays    . zolpidem (AMBIEN) 10 MG tablet Take 10 mg by mouth as needed for sleep.     No  current facility-administered medications for this visit.   Allergies:  Codeine, Demerol [meperidine], Hydrocodone, and Lortab [hydrocodone-acetaminophen]   Social History: The patient  reports that she has never smoked. She has never used smokeless tobacco. She reports that she does not drink alcohol and does not use drugs.   Family History: The patient's family history includes Breast cancer (age of onset: 90) in her cousin; Breast cancer (age of onset: 6) in her maternal aunt; Cancer in her brother and sister; Cancer (age of onset: 72) in her cousin; Cancer (age of onset: 69) in her maternal aunt; Cancer (age of onset: 79) in her maternal grandmother; Cancer (age of onset: 29) in her paternal grandfather; Cancer (age of onset: 25) in her father.   ROS:  Please see the history of present illness. Otherwise, complete review of systems is positive for none.  All other systems are reviewed and negative.   Physical Exam: VS:  BP 120/74   Pulse 72   Ht 5\' 7"  (1.702 m)   Wt 192 lb (87.1 kg)   SpO2 91%   BMI 30.07 kg/m , BMI Body mass index is 30.07 kg/m.  Wt Readings from Last 3 Encounters:  04/26/20 192 lb (87.1 kg)  03/17/20 191 lb (86.6 kg)  12/17/19 189 lb (85.7 kg)    General: Patient appears comfortable at rest. HEENT: Conjunctiva and lids normal, oropharynx clear with moist mucosa. Neck:  Supple, no elevated JVP or carotid bruits, no thyromegaly. Lungs: Clear to auscultation, nonlabored breathing at rest. Cardiac: Regular rate and rhythm, no S3 or significant systolic murmur, no pericardial rub. Abdomen: Soft, nontender, no hepatomegaly, bowel sounds present, no guarding or rebound. Extremities: No pitting edema, distal pulses 2+. Skin: Warm and dry. Musculoskeletal: No kyphosis. Neuropsychiatric: Alert and oriented x3, affect grossly appropriate.  ECG:    Recent Labwork: 07/21/2019: B Natriuretic Peptide 16.6 09/30/2019: BUN 10; Creatinine, Ser 0.83; Potassium 4.1; Sodium 139     Component Value Date/Time   CHOL 153 06/21/2014 1035   TRIG 150 (H) 06/21/2014 1035   HDL 46 06/21/2014 1035   CHOLHDL 3.3 06/21/2014 1035   VLDL 30 06/21/2014 1035   LDLCALC 77 06/21/2014 1035    Other Studies Reviewed Today:  Echocardiogram 03/17/2020 1. Left ventricular ejection fraction, by estimation, is 25 to 30%. The left ventricle has severely decreased function. The left ventricle demonstrates global hypokinesis. Left ventricular diastolic parameters are consistent with Grade I diastolic dysfunction (impaired relaxation). Elevated left atrial pressure. 2. Right ventricular systolic function is normal. The right ventricular size is normal. There is normal pulmonary artery systolic pressure. The estimated right ventricular systolic pressure is [id number] mmHg. 3. The mitral valve is normal in structure. Trivial mitral valve regurgitation. No evidence of mitral stenosis. 4. The aortic valve is normal in structure. Aortic valve regurgitation is not visualized. No aortic stenosis is present. 5. The inferior vena cava is normal in size with greater than 50% respiratory variability, suggesting right atrial pressure of 3 mmHg. Comparison(s): No significant change from prior study.   Assessment and Plan:   1. Chronic systolic heart failure (HCC) Last echocardiogram 03/17/2020 EF 25 to  30%.  Global hypokinesis, G1 DD, trivial lumbar.  Seeing Dr. Nani Ravens at advanced heart failure clinic.  Continue carvedilol 3.125 mg p.o. twice daily.  Continue Lasix 40 mg every other day.  Continue spironolactone 12.5 mg p.o. daily.  2. Nonischemic cardiomyopathy ([place]) Continues to see Dr. Haroldine Laws for chronic systolic heart failure.  Echo results recently as  noted above in #1.  Recent ICD interrogation showed no evidence of VT or AF.  3. Essential hypertension Blood pressure well controlled on current therapy.  Blood pressure today 120/84.  Continue losartan 50 mg p.o. daily.  Continue carvedilol 3.125 mg p.o. twice daily.  Continue spironolactone 12.5 mg p.o. daily.  4.  Hyperlipidemia Recent lipid panel showed LDL of 153.  Start Crestor 5 mg p.o. daily.  Get a follow-up FLP and LFT in 6 to 8 weeks.  Medication Adjustments/Labs and Tests Ordered: Current medicines are reviewed at length with the patient today.  Concerns regarding medicines are outlined above.   Disposition: Follow-up with Dr. Harl Bowie or APP 3 months.  Signed, Levell July, NP 04/26/2020 9:47 AM    [facility] [place], [place], [place] [address] Phone: [phone]; Fax: [phone]

## 2020-04-26 ENCOUNTER — Encounter: Payer: Self-pay | Admitting: Family Medicine

## 2020-04-26 ENCOUNTER — Other Ambulatory Visit: Payer: Self-pay

## 2020-04-26 ENCOUNTER — Ambulatory Visit (INDEPENDENT_AMBULATORY_CARE_PROVIDER_SITE_OTHER): Payer: Federal, State, Local not specified - PPO | Admitting: Family Medicine

## 2020-04-26 VITALS — BP 120/74 | HR 72 | Ht 67.0 in | Wt 192.0 lb

## 2020-04-26 DIAGNOSIS — I5022 Chronic systolic (congestive) heart failure: Secondary | ICD-10-CM | POA: Diagnosis not present

## 2020-04-26 DIAGNOSIS — I10 Essential (primary) hypertension: Secondary | ICD-10-CM

## 2020-04-26 DIAGNOSIS — E785 Hyperlipidemia, unspecified: Secondary | ICD-10-CM

## 2020-04-26 DIAGNOSIS — I428 Other cardiomyopathies: Secondary | ICD-10-CM

## 2020-04-26 MED ORDER — ROSUVASTATIN CALCIUM 5 MG PO TABS: 5.0000 mg | ORAL_TABLET | Freq: Every day | ORAL | 6 refills | Status: DC

## 2020-04-26 NOTE — Patient Instructions (Signed)
Medication Instructions:   Begin Crestor 5mg  daily.   Continue all other medications.    Labwork:  FLP, FLT - due in 6 - 8 weeks.   Will mail reminder when time to do.  Testing/Procedures: none  Follow-Up: 3 months   Any Other Special Instructions Will Be Listed Below (If Applicable).  If you need a refill on your cardiac medications before your next appointment, please call your pharmacy.

## 2020-04-26 NOTE — Addendum Note (Signed)
Addended by: Laurine Blazer on: 04/26/2020 10:39 AM   Modules accepted: Orders

## 2020-05-11 ENCOUNTER — Ambulatory Visit: Payer: Federal, State, Local not specified - PPO | Admitting: Cardiology

## 2020-05-14 ENCOUNTER — Other Ambulatory Visit: Payer: Self-pay | Admitting: Internal Medicine

## 2020-05-20 ENCOUNTER — Telehealth: Payer: Self-pay | Admitting: Hematology and Oncology

## 2020-05-20 NOTE — Telephone Encounter (Signed)
Rescheduled from 2/9 per provider. Called and spoke with pt confirmed 3/8 appt

## 2020-05-24 ENCOUNTER — Ambulatory Visit (INDEPENDENT_AMBULATORY_CARE_PROVIDER_SITE_OTHER): Payer: Federal, State, Local not specified - PPO

## 2020-05-24 DIAGNOSIS — I5022 Chronic systolic (congestive) heart failure: Secondary | ICD-10-CM

## 2020-05-24 DIAGNOSIS — Z95810 Presence of automatic (implantable) cardiac defibrillator: Secondary | ICD-10-CM

## 2020-05-25 ENCOUNTER — Other Ambulatory Visit: Payer: Self-pay

## 2020-05-25 ENCOUNTER — Ambulatory Visit: Payer: Federal, State, Local not specified - PPO | Admitting: Hematology and Oncology

## 2020-05-25 ENCOUNTER — Ambulatory Visit
Admission: RE | Admit: 2020-05-25 | Discharge: 2020-05-25 | Disposition: A | Discharge status: Home / Self Care | Payer: Federal, State, Local not specified - PPO | Source: Ambulatory Visit | Attending: Hematology and Oncology | Admitting: Hematology and Oncology

## 2020-05-25 DIAGNOSIS — Z1231 Encounter for screening mammogram for malignant neoplasm of breast: Secondary | ICD-10-CM

## 2020-05-31 ENCOUNTER — Other Ambulatory Visit: Payer: Self-pay | Admitting: Hematology and Oncology

## 2020-05-31 DIAGNOSIS — R928 Other abnormal and inconclusive findings on diagnostic imaging of breast: Secondary | ICD-10-CM

## 2020-06-01 NOTE — Progress Notes (Signed)
EPIC Encounter for ICM Monitoring  Patient Name: Loretta Thomas is a 70 y.o. female Date: 06/01/2020 Primary Care Physican: Rossie Muskrat, DO Primary Cardiologist:Bensimhon Electrophysiologist:Allred Bi-V Pacing:>99% 06/01/2020 Weight:190-191lbs   Spoke with patient and reports she does not have any energy.   She reports she currently has swelling of feet.    Corvue thoracic impedancesuggesting normal fluid levels.  Prescribed:  Furosemide40 mg take 1 tabletevery other day.  Spironolactone 25 mg take 0.5 tablet (12.5mg  total)every other day  Labs: 09/30/2019 Creatinine 0.83, BUN 10, Potassium 4.1, Sodium 139, GFR >60 07/21/2019 Creatinine 0.77, BUN 9, Potassium 4.3, Sodium 140, GFR >60 A complete set of results can be found in Results Review.  Recommendations:Recommendation to limit salt intake to 2000 mg daily and fluid intake to 64 oz daily.  Encouraged to call if experiencing any fluid symptoms.   Follow-up plan: ICM clinic phone appointment on3/23/2022. 91 day device clinic remote transmission3/22/2022.   EP/Cardiology Office Visits:07/26/2020 with Levell July, NP. 06/17/2020 with Dr Rayann Heman.  Copy of ICM check sent to Dr.Allred.    3 month ICM trend: 05/24/2020.    1 Year ICM trend:       [name], RN 06/01/2020 9:38 AM

## 2020-06-13 ENCOUNTER — Ambulatory Visit
Admission: RE | Admit: 2020-06-13 | Discharge: 2020-06-13 | Disposition: A | Discharge status: Home / Self Care | Payer: Federal, State, Local not specified - PPO | Source: Ambulatory Visit | Attending: Hematology and Oncology | Admitting: Hematology and Oncology

## 2020-06-13 ENCOUNTER — Ambulatory Visit: Payer: Federal, State, Local not specified - PPO

## 2020-06-13 ENCOUNTER — Other Ambulatory Visit: Payer: Self-pay

## 2020-06-13 DIAGNOSIS — R928 Other abnormal and inconclusive findings on diagnostic imaging of breast: Secondary | ICD-10-CM

## 2020-06-14 ENCOUNTER — Other Ambulatory Visit: Payer: Self-pay | Admitting: *Deleted

## 2020-06-14 ENCOUNTER — Encounter: Payer: Self-pay | Admitting: *Deleted

## 2020-06-14 DIAGNOSIS — E785 Hyperlipidemia, unspecified: Secondary | ICD-10-CM

## 2020-06-17 ENCOUNTER — Ambulatory Visit (INDEPENDENT_AMBULATORY_CARE_PROVIDER_SITE_OTHER): Payer: Federal, State, Local not specified - PPO | Admitting: Internal Medicine

## 2020-06-17 ENCOUNTER — Encounter: Payer: Self-pay | Admitting: Internal Medicine

## 2020-06-17 ENCOUNTER — Other Ambulatory Visit: Payer: Self-pay | Admitting: *Deleted

## 2020-06-17 VITALS — BP 120/80 | HR 82 | Ht 67.0 in | Wt 191.0 lb

## 2020-06-17 DIAGNOSIS — I519 Heart disease, unspecified: Secondary | ICD-10-CM

## 2020-06-17 DIAGNOSIS — I10 Essential (primary) hypertension: Secondary | ICD-10-CM

## 2020-06-17 DIAGNOSIS — I428 Other cardiomyopathies: Secondary | ICD-10-CM | POA: Diagnosis not present

## 2020-06-17 DIAGNOSIS — I447 Left bundle-branch block, unspecified: Secondary | ICD-10-CM | POA: Diagnosis not present

## 2020-06-17 MED ORDER — LOSARTAN POTASSIUM 50 MG PO TABS: 50.0000 mg | ORAL_TABLET | Freq: Every day | ORAL | 1 refills | Status: DC

## 2020-06-17 NOTE — Patient Instructions (Signed)
Medication Instructions:  Continue all current medications.  Labwork: none  Testing/Procedures: Your physician has requested that you have an echocardiogram. Echocardiography is a painless test that uses sound waves to create images of your heart. It provides your doctor with information about the size and shape of your heart and how well your heart's chambers and valves are working. This procedure takes approximately one hour. There are no restrictions for this procedure - due in 4 months   Follow-Up: 6 months   Any Other Special Instructions Will Be Listed Below (If Applicable).  If you need a refill on your cardiac medications before your next appointment, please call your pharmacy.

## 2020-06-17 NOTE — Progress Notes (Signed)
PCP: Rossie Muskrat, DO Primary Cardiologist: Dr Haroldine Laws Primary EP: Dr Rayann Heman  Loretta Thomas is a 70 y.o. female who presents today for routine electrophysiology followup.  Since last being seen in our clinic, the patient reports doing reasonably well.  She has "no energy".  + SOB with activity.  Today, she denies symptoms of palpitations, chest pain,  lower extremity edema, dizziness, presyncope, syncope, or ICD shocks.  The patient is otherwise without complaint today.   Past Medical History:  Diagnosis Date  . Anxiety   . Bladder cystocele   . Breast cancer ([place])    s/p L breast cancer removal  . Bundle branch block, left    diagnosed 03/2012 followed by cath showing only minimal CAD  . Bursitis   . Cancer ([place])    right breast  . Capsulitis   . CHF (congestive heart failure) ([place])   . Diverticulosis   . Elevated liver enzymes 2007  . History of IBS   . Hyperlipidemia   . Hypertension   . Nonischemic cardiomyopathy ([place])   . pernicious anemia    Past Surgical History:  Procedure Laterality Date  . ANKLE FRACTURE SURGERY    . BIV ICD INSERTION CRT-D N/A 01/03/2018    [facility] Assura MP model [phone] (serial  Number [id number]) biventricular ICD for primary prevention of sudden death  . BREAST BIOPSY     right breast  . BREAST RECONSTRUCTION WITH PLACEMENT OF TISSUE EXPANDER AND FLEX HD (ACELLULAR HYDRATED DERMIS) Right 01/28/2014   Procedure: RIGHT BREAST RECONSTRUCTION WITH PLACEMENT OF TISSUE EXPANDER;  Surgeon: Crissie Reese, MD;  Location: [place];  Service: Plastics;  Laterality: Right;  . CARDIAC CATHETERIZATION     x3; 03/18/12 [facility]): 10-20% pLAD, <20% ostial LCX, 20% mPLA, EF 60%.    . CHOLECYSTECTOMY    . COLONOSCOPY     x4  . DILATION AND CURETTAGE OF UTERUS    . MASTECTOMY Right    malignant  . REDUCTION MAMMAPLASTY Left   . RHINOPLASTY    . SIMPLE MASTECTOMY WITH AXILLARY SENTINEL NODE BIOPSY Right 01/28/2014    Procedure: RIGHT TOTAL MASTECTOMY WITH  RIGHT AXILLARY SENTINEL NODE BIOPSY;  Surgeon: Excell Seltzer, MD;  Location: [place];  Service: General;  Laterality: Right;  . TONSILLECTOMY AND ADENOIDECTOMY    . TUBAL LIGATION      ROS- all systems are reviewed and negative except as per HPI above  Current Outpatient Medications  Medication Sig Dispense Refill  . acetaminophen (TYLENOL) 500 MG tablet Take 500 mg by mouth every 6 (six) hours as needed.     . ALPRAZolam (XANAX) 0.5 MG tablet Take 0.5 mg by mouth every 8 (eight) hours as needed. for pain    . Calcium Carbonate-Vitamin D 600-400 MG-UNIT tablet Take 1 tablet by mouth 2 (two) times daily.    . carvedilol (COREG) 3.125 MG tablet Take 1 tablet (3.125 mg total) by mouth 2 (two) times daily with a meal. 60 tablet 3  . cetirizine (ZYRTEC) 10 MG tablet Take 10 mg by mouth daily.     . cyanocobalamin (,VITAMIN B-12,) 1000 MCG/ML injection Inject 1 mL (1,000 mcg total) into the muscle every 30 (thirty) days. 10 mL 0  . famotidine (PEPCID) 40 MG tablet Take 40 mg by mouth daily as needed.   6  . FARXIGA 10 MG TABS tablet TAKE 1 TABLET (10 MG TOTAL) BY MOUTH DAILY BEFORE BREAKFAST. 30 tablet 5  . fluconazole (DIFLUCAN)  200 MG tablet Take 1 tablet (200 mg total) by mouth daily as needed. 10 tablet 0  . furosemide (LASIX) 40 MG tablet Take 1 tablet (40 mg total) by mouth every other day. 45 tablet 3  . letrozole (FEMARA) 2.5 MG tablet Take 1 tablet (2.5 mg total) by mouth daily. 90 tablet 3  . loratadine (CLARITIN) 10 MG tablet Take 10 mg by mouth daily as needed.     Marland Kitchen losartan (COZAAR) 50 MG tablet Take 50 mg by mouth daily.    . rosuvastatin (CRESTOR) 5 MG tablet Take 5 mg by mouth daily.    Marland Kitchen spironolactone (ALDACTONE) 25 MG tablet Take 0.5 tablets (12.5 mg total) by mouth daily. 90 tablet 3  . triamcinolone cream (KENALOG) 0.1 % Apply 1 application topically 2 (two) times daily as needed for itching.    . venlafaxine XR (EFFEXOR-XR) 37.5  MG 24 hr capsule Take 1 capsule (37.5 mg total) by mouth daily with breakfast. 90 capsule 1  . Vitamin D, Ergocalciferol, (DRISDOL) 50000 UNITS CAPS capsule Take 50,000 Units by mouth every 7 (seven) days. Fridays    . zolpidem (AMBIEN) 10 MG tablet Take 10 mg by mouth as needed for sleep.     No current facility-administered medications for this visit.    Physical Exam: Vitals:   06/17/20 0915  BP: 120/80  Pulse: 82  SpO2: 98%  Weight: 191 lb (86.6 kg)  Height: 5\' 7"  (1.702 m)    GEN- The patient is well appearing, alert and oriented x 3 today.   Head- normocephalic, atraumatic Eyes-  Sclera clear, conjunctiva pink Ears- hearing intact Oropharynx- clear Lungs- Clear to ausculation bilaterally, normal work of breathing Chest- ICD pocket is well healed Heart- Regular rate and rhythm, no murmurs, rubs or gallops, PMI not laterally displaced GI- soft, NT, ND, + BS Extremities- no clubbing, cyanosis, or edema  ICD interrogation- reviewed in detail today,  See PACEART report  ekg tracing ordered today is personally reviewed and shows sinus with BiV pacing.  Underlying is LBBB with QRS 146 msec.  Today she came in programmed LV >RV by 40 msec with LV 1-2 vector.  Her qrs was 92 msec.  With optimization today, best multisite pacing was LV 1-2 and LV 3-coil to coil with qrs of 118 msec.  Wt Readings from Last 3 Encounters:  06/17/20 191 lb (86.6 kg)  04/26/20 192 lb (87.1 kg)  03/17/20 191 lb (86.6 kg)    Assessment and Plan:  1.  Chronic systolic dysfunction/ nonischemic CM/ LBBB euvolemic today Stable on an appropriate medical regimen Normal ICD function See Pace Art report No changes today she is not device dependant today followed in ICM device clinic I have optimized device today by turning multisite pacing on. I will have her follow-up with O[facility] Kilts with an echo in 4 months to reassess.  If EF and NYHA class have not substantially improved, we will turn multisite  pacing off to preserve battery.  2. HTN Stable No change required today   Risks, benefits and potential toxicities for medications prescribed and/or refilled reviewed with patient today.   Oda Kilts in 4 months Return to see me in 6 months  Thompson Grayer MD, [facility] 06/17/2020 9:34 AM

## 2020-06-17 NOTE — Addendum Note (Signed)
Addended by: Laurine Blazer on: 06/17/2020 01:38 PM   Modules accepted: Orders

## 2020-06-17 NOTE — Addendum Note (Signed)
Addended by: Laurine Blazer on: 06/17/2020 11:43 AM   Modules accepted: Orders

## 2020-06-20 NOTE — Progress Notes (Signed)
Patient Care Team: Rossie Muskrat, DO as PCP - General (Family Medicine) Thompson Grayer, MD as PCP - Electrophysiology (Cardiology) Sueanne Margarita, MD as PCP - Sleep Medicine (Cardiology) Bensimhon, Shaune Pascal, MD as PCP - Advanced Heart Failure (Cardiology) Rossie Muskrat, DO as Referring Physician (Family Medicine)  DIAGNOSIS:    ICD-10-CM   1. Malignant neoplasm of upper-outer quadrant of right breast in female, estrogen receptor positive ([place])  C50.411    Z17.0     SUMMARY OF ONCOLOGIC HISTORY: Oncology History  Breast cancer of upper-outer quadrant of right female breast ([place])  10/19/2013 Mammogram   outer right breast, middle third, demonstrate a 6 x 6 x 12 mm group of heterogeneous calcifications   11/10/2013 Breast MRI   nonmacerated enhancement in the retroareolar region extending to the nipple with the area of enhancement measuring 4.3 x 2.5 x 3.0 cm   11/12/2013 Initial Diagnosis   Right mastectomy: Invasive ductal carcinoma with DCIS with comedo-type necrosis and calcifications in ER 99% PR 30% Ki-67 40% HER-2 negative ratio 1.14; Grade 3; second biopsy from subareolar area done 11/16/2013 revealed DCIS with calcifications   12/29/2013 Procedure   BRCA 1 and 2 Negative: 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 with above-mentioned history of right breast cancer treated with mastectomy and who is currently on antiestrogen therapy with letrozole. Mammogram on 05/30/20 showed a  possible left breast distortion. Diagnostic mammogram on 06/13/20 showed no evidence of malignancy in the left breast. She presents to the clinic today for follow-up.  Her major complaint today is profound fatigue.  She does not know if it is related to her heart or her elevated blood sugars in the urine or medications.  She feels very depressed all the time.  ALLERGIES:  is allergic to codeine, demerol [meperidine], hydrocodone, and lortab [hydrocodone-acetaminophen].  MEDICATIONS:  Current Outpatient Medications  Medication Sig Dispense Refill  . acetaminophen (TYLENOL) 500 MG tablet Take 500 mg by mouth every 6 (six) hours as needed.     . ALPRAZolam (XANAX) 0.5 MG tablet Take 0.5 mg by mouth every 8 (eight) hours as needed. for pain    . Calcium Carbonate-Vitamin D 600-400 MG-UNIT tablet Take 1 tablet by mouth 2 (two) times daily.    . carvedilol (COREG) 3.125 MG tablet Take 1 tablet (3.125 mg total) by mouth 2 (two) times daily with a meal. 60 tablet 3  . cetirizine (ZYRTEC) 10 MG tablet Take 10 mg by mouth daily.     . cyanocobalamin (,VITAMIN B-12,) 1000 MCG/ML injection Inject 1 mL (1,000 mcg total) into the muscle every 30 (thirty) days. 10 mL 0  . famotidine (PEPCID) 40 MG tablet Take 40 mg by mouth daily as needed.   6  . FARXIGA 10 MG TABS tablet TAKE 1 TABLET (10 MG TOTAL) BY MOUTH DAILY BEFORE BREAKFAST. 30 tablet 5  . fluconazole (DIFLUCAN) 200 MG tablet Take 1 tablet (200 mg total) by mouth daily as needed. 10 tablet 0  . furosemide (LASIX) 40 MG tablet Take 1 tablet (  40 mg total) by mouth every other day. 45 tablet 3  . letrozole (FEMARA) 2.5 MG tablet Take 1 tablet (2.5 mg total) by mouth daily. 90 tablet 3  . loratadine (CLARITIN) 10 MG tablet Take 10 mg by mouth daily as needed.     Marland Kitchen losartan (COZAAR) 50 MG tablet Take 1 tablet (50 mg total) by mouth daily. 90 tablet 1  . rosuvastatin (CRESTOR) 5 MG tablet Take 5 mg by mouth daily.    Marland Kitchen spironolactone (ALDACTONE) 25 MG  tablet Take 0.5 tablets (12.5 mg total) by mouth daily. 90 tablet 3  . triamcinolone cream (KENALOG) 0.1 % Apply 1 application topically 2 (two) times daily as needed for itching.    . venlafaxine XR (EFFEXOR-XR) 37.5 MG 24 hr capsule Take 1 capsule (37.5 mg total) by mouth daily with breakfast. 90 capsule 1  . Vitamin D, Ergocalciferol, (DRISDOL) 50000 UNITS CAPS capsule Take 50,000 Units by mouth every 7 (seven) days. Fridays    . zolpidem (AMBIEN) 10 MG tablet Take 10 mg by mouth as needed for sleep.     No current facility-administered medications for this visit.    PHYSICAL EXAMINATION: ECOG PERFORMANCE STATUS: 1 - Symptomatic but completely ambulatory  There were no vitals filed for this visit. There were no vitals filed for this visit.     LABORATORY DATA:  I have reviewed the data as listed CMP Latest Ref Rng & Units 09/30/2019 07/21/2019 07/13/2019  Glucose 70 - 99 mg/dL 107(H) 106(H) -  BUN 8 - 23 mg/dL 10 9 -  Creatinine 0.44 - 1.00 mg/dL 0.83 0.77 -  Sodium 135 - 145 mmol/L 139 140 -  Potassium 3.5 - 5.1 mmol/L 4.1 4.3 -  Chloride 98 - 111 mmol/L 103 103 -  CO2 22 - 32 mmol/L 27 27 -  Calcium 8.9 - 10.3 mg/dL 9.4 9.3 -  Total Protein 6.0 - 8.5 g/dL - - 6.6  Total Bilirubin 0.20 - 1.20 mg/dL - - -  Alkaline Phos 40 - 150 U/L - - -  AST 5 - 34 U/L - - -  ALT 0 - 55 U/L - - -    Lab Results  Component Value Date   WBC 6.5 12/30/2017   HGB 12.3 12/30/2017   HCT 35.9 12/30/2017   MCV 90 12/30/2017   PLT 212 12/30/2017   NEUTROABS 4.2 12/30/2017    ASSESSMENT & PLAN:  Breast cancer of upper-outer quadrant of right female breast ([place]) Right breast IDCwith high-grade DCIS with comedonecrosis ER/PR positive HER-2 negative Ki-67 was 40%.  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 She may consider taking Effexor every other day.  Breast Cancer Surveillance: 1. Mammogram 2/28/2022benign.Breast Density Category B. 2.  Breast exam: 06/21/20: Benign 3.  Breast MRI 10/22/2017: Benign, patient will need annual breast MRIs.  Congestive heart failure: Currently on therapy  Prior history of pernicious anemia on monthly B12 injections: B12 injections  Profound fatigue: It could be multifactorial between her heart, sugars, low blood pressures, medications etc.  She will  to see an endocrinologist to understand whether or not she truly has diabetes.  Her A1c is 5.5 but she has a urine sugar of 1000. I increase the dosage of Effexor.  Return to clinic in 1 year for follow-up    No orders of the defined types were placed in this encounter.  The patient has a good understanding of the overall plan. she agrees with it. she will call with any problems that may develop before the next visit here.  Total time spent: 30 mins including face to face time and time spent for planning, charting and coordination of care  Rulon Eisenmenger, MD, MPH 06/21/2020  I, Molly Dorshimer, am acting as scribe for Dr. Nicholas Lose.  I have reviewed the above documentation for accuracy and completeness, and I agree with the above.

## 2020-06-20 NOTE — Assessment & Plan Note (Signed)
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 She may consider taking Effexor every other day.  Breast Cancer Surveillance: 1. Mammogram 2/28/2022benign.Breast Density Category B. 2.  Breast exam: 06/21/20: Benign 3.  Breast MRI 10/22/2017: Benign, patient will need annual breast MRIs.  Congestive heart failure: Currently on therapy  Prior history of pernicious anemia on monthly B12 injections: B12 injections Return to clinic in 1 year for follow-up

## 2020-06-21 ENCOUNTER — Inpatient Hospital Stay
Payer: Federal, State, Local not specified - PPO | Attending: Hematology and Oncology | Admitting: Hematology and Oncology

## 2020-06-21 ENCOUNTER — Other Ambulatory Visit: Payer: Self-pay

## 2020-06-21 VITALS — BP 124/66 | HR 81 | Temp 97.7°F | Resp 19 | Ht 67.0 in | Wt 186.2 lb

## 2020-06-21 DIAGNOSIS — C50411 Malignant neoplasm of upper-outer quadrant of right female breast: Secondary | ICD-10-CM

## 2020-06-21 DIAGNOSIS — F329 Major depressive disorder, single episode, unspecified: Secondary | ICD-10-CM | POA: Diagnosis not present

## 2020-06-21 DIAGNOSIS — Z9011 Acquired absence of right breast and nipple: Secondary | ICD-10-CM | POA: Insufficient documentation

## 2020-06-21 DIAGNOSIS — Z170 Estrogen receptor positive status [ER+]: Secondary | ICD-10-CM

## 2020-06-21 DIAGNOSIS — D510 Vitamin B12 deficiency anemia due to intrinsic factor deficiency: Secondary | ICD-10-CM | POA: Insufficient documentation

## 2020-06-21 DIAGNOSIS — R81 Glycosuria: Secondary | ICD-10-CM

## 2020-06-21 DIAGNOSIS — Z79899 Other long term (current) drug therapy: Secondary | ICD-10-CM | POA: Insufficient documentation

## 2020-06-21 DIAGNOSIS — I509 Heart failure, unspecified: Secondary | ICD-10-CM | POA: Diagnosis not present

## 2020-06-21 MED ORDER — VENLAFAXINE HCL ER 75 MG PO CP24: 75.0000 mg | ORAL_CAPSULE | Freq: Every day | ORAL | 3 refills | Status: DC

## 2020-06-24 ENCOUNTER — Other Ambulatory Visit: Payer: Self-pay | Admitting: Hematology and Oncology

## 2020-06-29 LAB — CUP PACEART INCLINIC DEVICE CHECK
Battery Remaining Longevity: 62 mo
Brady Statistic RA Percent Paced: 0.28 %
Brady Statistic RV Percent Paced: 99.95 %
Date Time Interrogation Session: 20220304091500
HighPow Impedance: 79.875
Implantable Lead Implant Date: 20190920
Implantable Lead Implant Date: 20190920
Implantable Lead Implant Date: 20190920
Implantable Lead Location: 753858
Implantable Lead Location: 753859
Implantable Lead Location: 753860
Implantable Pulse Generator Implant Date: 20190920
Lead Channel Impedance Value: 1200 Ohm
Lead Channel Impedance Value: 412.5 Ohm
Lead Channel Impedance Value: 587.5 Ohm
Lead Channel Pacing Threshold Amplitude: 0.5 V
Lead Channel Pacing Threshold Amplitude: 0.75 V
Lead Channel Pacing Threshold Pulse Width: 0.5 ms
Lead Channel Pacing Threshold Pulse Width: 0.5 ms
Lead Channel Sensing Intrinsic Amplitude: 11.8 mV
Lead Channel Sensing Intrinsic Amplitude: 4.6 mV
Lead Channel Setting Pacing Amplitude: 2 V
Lead Channel Setting Pacing Amplitude: 2 V
Lead Channel Setting Pacing Amplitude: 2.125
Lead Channel Setting Pacing Pulse Width: 0.5 ms
Lead Channel Setting Pacing Pulse Width: 0.5 ms
Lead Channel Setting Sensing Sensitivity: 0.5 mV
Pulse Gen Serial Number: 9833194

## 2020-07-05 ENCOUNTER — Ambulatory Visit (INDEPENDENT_AMBULATORY_CARE_PROVIDER_SITE_OTHER): Payer: Federal, State, Local not specified - PPO

## 2020-07-05 DIAGNOSIS — I428 Other cardiomyopathies: Secondary | ICD-10-CM | POA: Diagnosis not present

## 2020-07-05 LAB — CUP PACEART REMOTE DEVICE CHECK
Battery Remaining Longevity: 55 mo
Battery Remaining Percentage: 67 %
Battery Voltage: 2.95 V
Brady Statistic AP VP Percent: 1 %
Brady Statistic AP VS Percent: 1 %
Brady Statistic AS VP Percent: 99 %
Brady Statistic AS VS Percent: 1 %
Brady Statistic RA Percent Paced: 1 %
Date Time Interrogation Session: 20220322020015
HighPow Impedance: 80 Ohm
HighPow Impedance: 80 Ohm
Implantable Lead Implant Date: 20190920
Implantable Lead Implant Date: 20190920
Implantable Lead Implant Date: 20190920
Implantable Lead Location: 753858
Implantable Lead Location: 753859
Implantable Lead Location: 753860
Implantable Pulse Generator Implant Date: 20190920
Lead Channel Impedance Value: 1300 Ohm
Lead Channel Impedance Value: 410 Ohm
Lead Channel Impedance Value: 630 Ohm
Lead Channel Pacing Threshold Amplitude: 0.5 V
Lead Channel Pacing Threshold Amplitude: 0.625 V
Lead Channel Pacing Threshold Amplitude: 1.125 V
Lead Channel Pacing Threshold Pulse Width: 0.5 ms
Lead Channel Pacing Threshold Pulse Width: 0.5 ms
Lead Channel Pacing Threshold Pulse Width: 0.5 ms
Lead Channel Sensing Intrinsic Amplitude: 11.8 mV
Lead Channel Sensing Intrinsic Amplitude: 3.9 mV
Lead Channel Setting Pacing Amplitude: 2 V
Lead Channel Setting Pacing Amplitude: 2 V
Lead Channel Setting Pacing Amplitude: 2.125
Lead Channel Setting Pacing Pulse Width: 0.5 ms
Lead Channel Setting Pacing Pulse Width: 0.5 ms
Lead Channel Setting Sensing Sensitivity: 0.5 mV
Pulse Gen Serial Number: 9833194

## 2020-07-06 ENCOUNTER — Telehealth: Payer: Self-pay

## 2020-07-06 ENCOUNTER — Ambulatory Visit (INDEPENDENT_AMBULATORY_CARE_PROVIDER_SITE_OTHER): Payer: Federal, State, Local not specified - PPO

## 2020-07-06 DIAGNOSIS — Z95810 Presence of automatic (implantable) cardiac defibrillator: Secondary | ICD-10-CM | POA: Diagnosis not present

## 2020-07-06 DIAGNOSIS — I5022 Chronic systolic (congestive) heart failure: Secondary | ICD-10-CM

## 2020-07-06 NOTE — Telephone Encounter (Signed)
Remote ICM transmission received.  Attempted call to patient regarding ICM remote transmission and left message per to return call.

## 2020-07-06 NOTE — Progress Notes (Signed)
EPIC Encounter for ICM Monitoring  Patient Name: Loretta Thomas is a 70 y.o. female Date: 07/06/2020 Primary Care Physican: Rossie Muskrat, DO Primary Cardiologist:Bensimhon Electrophysiologist:Allred Bi-V Pacing:>99% 06/01/2020 Weight:190-191lbs   Spoke with patient and she reports swelling in her stomach and hands.  She reports she had UTI last week and increased fluids as recommended by the physician which correlates with decreased impedance.  She is unsure of current weight.  Her sister is in the hospital due to heart problems.  Corvue thoracic impedancesuggesting possible fluid accumulation starting 06/29/2020.  Prescribed:  Furosemide40 mg take 1 tabletevery other day.  Labs: 02/02/2020 Creatinine 0.74, BUN 15, Potassium 4.2, Sodium 140, GFR 83-96 09/30/2019 Creatinine 0.83, BUN 10, Potassium 4.1, Sodium 139, GFR >60 07/21/2019 Creatinine 0.77, BUN 9, Potassium 4.3, Sodium 140, GFR >60 A complete set of results can be found in Results Review.  Recommendations:Advised to take 60 mg Furosemide x 3 days and then return to prescribed dosage.  Advised to limit fluid intake to 64 oz daily.  Follow-up plan: ICM clinic phone appointment on3/28/2022 (manual) to recheck fluid levels. 91 day device clinic remote transmission6/21/2022.   EP/Cardiology Office Visits:07/26/2020 with Levell July, NP.10/19/2020 with Oda Kilts, PA.  Copy of ICM check sent to Dr.Allred and Dr Haroldine Laws as Juluis Rainier for temporary Furosemide adjustment.   3 month ICM trend: 07/06/2020.    1 Year ICM trend:       [name], RN 07/06/2020 7:36 AM

## 2020-07-08 ENCOUNTER — Encounter: Payer: Self-pay | Admitting: *Deleted

## 2020-07-08 NOTE — Progress Notes (Signed)
Received: Today Bensimhon, Shaune Pascal, MD  Loghan Kurtzman Panda, RN; Thompson Grayer, MD Thanks

## 2020-07-11 ENCOUNTER — Ambulatory Visit (INDEPENDENT_AMBULATORY_CARE_PROVIDER_SITE_OTHER): Payer: Federal, State, Local not specified - PPO

## 2020-07-11 DIAGNOSIS — I5022 Chronic systolic (congestive) heart failure: Secondary | ICD-10-CM

## 2020-07-11 DIAGNOSIS — Z95810 Presence of automatic (implantable) cardiac defibrillator: Secondary | ICD-10-CM

## 2020-07-11 NOTE — Progress Notes (Signed)
EPIC Encounter for ICM Monitoring  Patient Name: Loretta Thomas is a 70 y.o. female Date: 07/11/2020 Primary Care Physican: Rossie Muskrat, DO Primary Cardiologist:Bensimhon Electrophysiologist:Allred Bi-V Pacing:>99% 2/16/2022Weight:190-191lbs(not weighing at home)   Spoke with patient and she reports she took Furosemide 60 mg x 5 days instead of the 3 days as instructed.    Corvue thoracic impedancesuggesting possible dryness after taking Furosemide x 5 days.  Prescribed:  Furosemide40 mg take 1 tabletevery other day.  Labs: 02/02/2020 Creatinine 0.74, BUN 15, Potassium 4.2, Sodium 140, GFR 83-96 09/30/2019 Creatinine 0.83, BUN 10, Potassium 4.1, Sodium 139, GFR >60 07/21/2019 Creatinine 0.77, BUN 9, Potassium 4.3, Sodium 140, GFR >60 A complete set of results can be found in Results Review.  Recommendations:Advised to increase fluid intake by a couple glasses today to help with dehydration.  Advised after today, go back to limiting fluid intake to 64 oz daily.   Follow-up plan: ICM clinic phone appointment on4/25/2022. 91 day device clinic remote transmission6/21/2022.   EP/Cardiology Office Visits:07/26/2020 with Levell July, NP.   10/19/2020 with Oda Kilts, PA.  Copy of ICM check sent to Dr.Allred.   3 month ICM trend: 07/11/2020.    1 Year ICM trend:       [name], RN 07/11/2020 1:04 PM

## 2020-07-14 NOTE — Progress Notes (Signed)
Remote ICD transmission.   

## 2020-07-21 ENCOUNTER — Telehealth: Payer: Self-pay | Admitting: Family Medicine

## 2020-07-21 NOTE — Telephone Encounter (Signed)
Patient called stating she's not started taking her cholesterolol medication yet due to a lot of things going on.   Does she need to r/s her apt for next week w/ [facility]?  Please call [phone]

## 2020-07-21 NOTE — Telephone Encounter (Signed)
I encouraged patient to keep apt, she agrees.

## 2020-07-25 NOTE — Progress Notes (Signed)
Cardiology Office Note  Date: 07/26/2020   ID: Loretta Thomas, DOB [birth year], MRN [id number]  PCP:  Rossie Muskrat, DO  Cardiologist:  No primary care provider on file. Electrophysiologist:  Thompson Grayer, MD   Chief Complaint: 3 month follow up  History of Present Illness: Loretta Thomas is a 70 y.o. female with a history of chronic systolic dysfunction of LV, nonischemic cardiomyopathy, LBBB, essential hypertension.  Last seen by Dr. Rayann Heman for routine EP follow-up.  She reported doing reasonably well.  Described having no energy, positive shortness of breath with activity.  Denied any symptoms of palpitations, chest pain, lower extremity edema, dizziness, presyncope or syncope.  No ICD shocks.  She was euvolemic.  Symptoms are stable on appropriate medical regimen.  Normal ICD function.  Multisite pacing was turned on to optimize device.  Plans were to follow-up with EP PA Mr. Chalmers Cater.  If EF and NYHA class have not substantially improved would turn multisite patient off to preserve battery.  Her hypertension was stable and there were no changes required.  She is here for 43-month follow-up.  States she seems to have less energy and she gives out easier.  She states she was on a higher dose of carvedilol but she felt significantly tired all the time and the dosage was reduced back to 3.125 mg p.o. twice daily.  Denies any significant dyspnea on exertion, weight gain, or lower extremity edema.  Appears to be maintaining her weight between 186 and 191.  Weight today is 189.  Denies any anginal symptoms, palpitations or arrhythmias, orthostatic symptoms.  Denies any bleeding issues.  No claudication-like symptoms, DVT or PE-like symptoms, lower extremity edema.  She states she has not started Crestor yet.  States she was having some issues after she took the flu shot and felt terrible and decided to defer starting Crestor.  She has multiple stressors at home.  She states she started [place] and  was told she had sugar in her urine.     Past Medical History:  Diagnosis Date  . Anxiety   . Bladder cystocele   . Breast cancer ([place])    s/p L breast cancer removal  . Bundle branch block, left    diagnosed 03/2012 followed by cath showing only minimal CAD  . Bursitis   . Cancer ([place])    right breast  . Capsulitis   . CHF (congestive heart failure) ([place])   . Diverticulosis   . Elevated liver enzymes 2007  . History of IBS   . Hyperlipidemia   . Hypertension   . Nonischemic cardiomyopathy ([place])   . pernicious anemia     Past Surgical History:  Procedure Laterality Date  . ANKLE FRACTURE SURGERY    . BIV ICD INSERTION CRT-D N/A 01/03/2018    [facility] Assura MP model [phone] (serial  Number [id number]) biventricular ICD for primary prevention of sudden death  . BREAST BIOPSY     right breast  . BREAST RECONSTRUCTION WITH PLACEMENT OF TISSUE EXPANDER AND FLEX HD (ACELLULAR HYDRATED DERMIS) Right 01/28/2014   Procedure: RIGHT BREAST RECONSTRUCTION WITH PLACEMENT OF TISSUE EXPANDER;  Surgeon: Crissie Reese, MD;  Location: [facility];  Service: Plastics;  Laterality: Right;  . CARDIAC CATHETERIZATION     x3; 03/18/12 [facility]): 10-20% pLAD, <20% ostial LCX, 20% mPLA, EF 60%.    . CHOLECYSTECTOMY    . COLONOSCOPY     x4  . DILATION AND CURETTAGE OF UTERUS    .  MASTECTOMY Right    malignant  . REDUCTION MAMMAPLASTY Left   . RHINOPLASTY    . SIMPLE MASTECTOMY WITH AXILLARY SENTINEL NODE BIOPSY Right 01/28/2014   Procedure: RIGHT TOTAL MASTECTOMY WITH  RIGHT AXILLARY SENTINEL NODE BIOPSY;  Surgeon: Excell Seltzer, MD;  Location: [facility];  Service: General;  Laterality: Right;  . TONSILLECTOMY AND ADENOIDECTOMY    . TUBAL LIGATION      Current Outpatient Medications  Medication Sig Dispense Refill  . acetaminophen (TYLENOL) 500 MG tablet Take 500 mg by mouth every 6 (six) hours as needed.     . Calcium Carbonate-Vitamin D 600-400 MG-UNIT  tablet Take 1 tablet by mouth 2 (two) times daily.    . carvedilol (COREG) 3.125 MG tablet Take 1 tablet (3.125 mg total) by mouth 2 (two) times daily with a meal. 60 tablet 3  . cetirizine (ZYRTEC) 10 MG tablet Take 10 mg by mouth daily.     . cyanocobalamin (,VITAMIN B-12,) 1000 MCG/ML injection Inject 1 mL (1,000 mcg total) into the muscle every 30 (thirty) days. 10 mL 0  . FARXIGA 10 MG TABS tablet TAKE 1 TABLET (10 MG TOTAL) BY MOUTH DAILY BEFORE BREAKFAST. 30 tablet 5  . furosemide (LASIX) 40 MG tablet Take 1 tablet (40 mg total) by mouth every other day. 45 tablet 3  . letrozole (FEMARA) 2.5 MG tablet Take 1 tablet (2.5 mg total) by mouth daily. 90 tablet 3  . loratadine (CLARITIN) 10 MG tablet Take 10 mg by mouth daily.    Marland Kitchen losartan (COZAAR) 50 MG tablet Take 1 tablet (50 mg total) by mouth daily. 90 tablet 1  . spironolactone (ALDACTONE) 25 MG tablet Take 0.5 tablets (12.5 mg total) by mouth daily. 90 tablet 3  . venlafaxine XR (EFFEXOR-XR) 75 MG 24 hr capsule Take 1 capsule (75 mg total) by mouth daily with breakfast. 90 capsule 3  . Vitamin D, Ergocalciferol, (DRISDOL) 50000 UNITS CAPS capsule Take 50,000 Units by mouth every 7 (seven) days. Fridays    . rosuvastatin (CRESTOR) 5 MG tablet Take 5 mg by mouth daily. (Patient not taking: Reported on 07/26/2020)     No current facility-administered medications for this visit.   Allergies:  Codeine, Demerol [meperidine], Hydrocodone, and Lortab [hydrocodone-acetaminophen]   Social History: The patient  reports that she has never smoked. She has never used smokeless tobacco. She reports that she does not drink alcohol and does not use drugs.   Family History: The patient's family history includes Breast cancer (age of onset: 55) in her cousin; Breast cancer (age of onset: 61) in her maternal aunt; Cancer in her brother and sister; Cancer (age of onset: 37) in her cousin; Cancer (age of onset: 46) in her maternal aunt; Cancer (age of onset:  24) in her maternal grandmother; Cancer (age of onset: 43) in her paternal grandfather; Cancer (age of onset: 71) in her father.   ROS:  Please see the history of present illness. Otherwise, complete review of systems is positive for none.  All other systems are reviewed and negative.   Physical Exam: VS:  BP 120/82   Pulse 72   Ht 5\' 7"  (1.702 m)   Wt 189 lb 6.4 oz (85.9 kg)   SpO2 98%   BMI 29.66 kg/m , BMI Body mass index is 29.66 kg/m.  Wt Readings from Last 3 Encounters:  07/26/20 189 lb 6.4 oz (85.9 kg)  06/21/20 186 lb 3.2 oz (84.5 kg)  06/17/20 191 lb (86.6 kg)  General: Patient appears comfortable at rest. Neck: Supple, no elevated JVP or carotid bruits, no thyromegaly. Lungs: Clear to auscultation, nonlabored breathing at rest. Cardiac: Regular rate and rhythm, no S3 or significant systolic murmur, no pericardial rub. Extremities: No pitting edema, distal pulses 2+. Skin: Warm and dry. Musculoskeletal: No kyphosis. Neuropsychiatric: Alert and oriented x3, affect grossly appropriate.  ECG:  EKG June 17, 2020 showed normal sinus rhythm rate of 75, nonspecific intraventricular block, possible lateral infarct, age undetermined.  Recent Labwork: 09/30/2019: BUN 10; Creatinine, Ser 0.83; Potassium 4.1; Sodium 139     Component Value Date/Time   CHOL 153 06/21/2014 1035   TRIG 150 (H) 06/21/2014 1035   HDL 46 06/21/2014 1035   CHOLHDL 3.3 06/21/2014 1035   VLDL 30 06/21/2014 1035   LDLCALC 77 06/21/2014 1035    Other Studies Reviewed Today:   Echocardiogram 03/17/2020 1. Left ventricular ejection fraction, by estimation, is 25 to 30%. The left ventricle has severely decreased function. The left ventricle demonstrates global hypokinesis. Left ventricular diastolic parameters are consistent with Grade I diastolic dysfunction (impaired relaxation). Elevated left atrial pressure. 2. Right ventricular systolic function is normal. The right ventricular size is normal.  There is normal pulmonary artery systolic pressure. The estimated right ventricular systolic pressure is 54.5 mmHg. 3. The mitral valve is normal in structure. Trivial mitral valve regurgitation. No evidence of mitral stenosis. 4. The aortic valve is normal in structure. Aortic valve regurgitation is not visualized. No aortic stenosis is present. 5. The inferior vena cava is normal in size with greater than 50% respiratory variability, suggesting right atrial pressure of 3 mmHg. Comparison(s): No significant change from prior study.   Assessment and Plan:  1. Chronic systolic heart failure ([place])   2. Nonischemic cardiomyopathy ([place])   3. Essential hypertension   4. Hyperlipidemia, unspecified hyperlipidemia type    1. Chronic systolic heart failure ([place]) States she feels more tired than usual recently.  States she gives out easily after performing activities.Last echocardiogram on 03/17/2020 demonstrated EF of 25 to 30%.  LV global hypokinesis, G1 DD.  Trivial MR.  Continue carvedilol 3.125 mg p.o. twice daily.  Continue Lasix 40 mg q. OD.  Continue losartan 50 mg daily.  Continue spironolactone 12.5 mg p.o. daily.    2. Nonischemic cardiomyopathy ([place]) Has a BiV ICD.  Recently seen by Dr. Rayann Heman who increased  multisite pacing was turned on to optimize device.  Plans were to follow-up with EP PA Mr. Chalmers Cater.  If EF and NYHA class have not substantially improved would turn multisite patient off to preserve battery.  She has an upcoming follow-up on July 6 with Joesph July, EP.  Patient has an upcoming follow-up echocardiogram Wednesday, October 30, 2018 a.m. to recheck EF.  3. Essential hypertension Blood pressure well controlled on current therapy.  Continue carvedilol 3.125 mg p.o. twice daily.  Continue losartan 50 mg p.o. daily.  Continue spironolactone 12.5 mg p.o. twice daily.  4. Hyperlipidemia, unspecified hyperlipidemia type Since last visit patient has not started her Crestor.   Advised her to start Crestor 5 mg daily.  We will get FLP's and LFTs in 6 to 8 weeks.  Medication Adjustments/Labs and Tests Ordered: Current medicines are reviewed at length with the patient today.  Concerns regarding medicines are outlined above.   Disposition: Follow-up with Dr. Harl Bowie or APP 6 months  Signed, Levell July, NP 07/26/2020 10:41 AM    [facility] [address], [place],  [place] [address] Phone: [phone]; Fax: [phone]

## 2020-07-26 ENCOUNTER — Ambulatory Visit: Payer: Federal, State, Local not specified - PPO | Admitting: Family Medicine

## 2020-07-26 ENCOUNTER — Other Ambulatory Visit: Payer: Self-pay | Admitting: *Deleted

## 2020-07-26 ENCOUNTER — Encounter: Payer: Self-pay | Admitting: Family Medicine

## 2020-07-26 VITALS — BP 120/82 | HR 72 | Ht 67.0 in | Wt 189.4 lb

## 2020-07-26 DIAGNOSIS — E785 Hyperlipidemia, unspecified: Secondary | ICD-10-CM | POA: Diagnosis not present

## 2020-07-26 DIAGNOSIS — I5022 Chronic systolic (congestive) heart failure: Secondary | ICD-10-CM | POA: Diagnosis not present

## 2020-07-26 DIAGNOSIS — I428 Other cardiomyopathies: Secondary | ICD-10-CM

## 2020-07-26 DIAGNOSIS — C50411 Malignant neoplasm of upper-outer quadrant of right female breast: Secondary | ICD-10-CM

## 2020-07-26 DIAGNOSIS — I10 Essential (primary) hypertension: Secondary | ICD-10-CM | POA: Diagnosis not present

## 2020-07-26 MED ORDER — VENLAFAXINE HCL ER 75 MG PO CP24: 75.0000 mg | ORAL_CAPSULE | Freq: Every day | ORAL | 3 refills | Status: DC

## 2020-07-26 NOTE — Patient Instructions (Addendum)
Your physician recommends that you schedule a follow-up appointment in: [place], NP  Your physician has recommended you make the following change in your medication:   START CRESTOR 5 MG DAILY   Your physician recommends that you return for lab work in: 6-8 WEEKS LIPIDS/HFT - FAST 6-8 HOURS PRIOR TO LAB WORK   Thank you for choosing [facility]!!

## 2020-08-01 ENCOUNTER — Other Ambulatory Visit: Payer: Self-pay | Admitting: Family Medicine

## 2020-08-08 ENCOUNTER — Ambulatory Visit (INDEPENDENT_AMBULATORY_CARE_PROVIDER_SITE_OTHER): Payer: Federal, State, Local not specified - PPO

## 2020-08-08 DIAGNOSIS — Z95810 Presence of automatic (implantable) cardiac defibrillator: Secondary | ICD-10-CM | POA: Diagnosis not present

## 2020-08-08 DIAGNOSIS — I5022 Chronic systolic (congestive) heart failure: Secondary | ICD-10-CM

## 2020-08-09 NOTE — Progress Notes (Signed)
EPIC Encounter for ICM Monitoring  Patient Name: Loretta Thomas is a 70 y.o. female Date: 08/09/2020 Primary Care Physican: Rossie Muskrat, DO Primary Cardiologist:Bensimhon Electrophysiologist:Allred Bi-V Pacing:>99% 4/26/2022Weight:189-190lbs(not weighing at home)   Spoke with patient and reports feeling well at this time.  Denies fluid symptoms.    Corvue thoracic impedancenormal.  Prescribed:  Furosemide40 mg take 1 tabletevery other day.  Labs: 02/02/2020 Creatinine 0.74, BUN 15, Potassium 4.2, Sodium 140, GFR 83-96 09/30/2019 Creatinine 0.83, BUN 10, Potassium 4.1, Sodium 139, GFR >60 07/21/2019 Creatinine 0.77, BUN 9, Potassium 4.3, Sodium 140, GFR >60 A complete set of results can be found in Results Review.  Recommendations:No changes and encouraged to call if experiencing any fluid symptoms.  Follow-up plan: ICM clinic phone appointment on6/09/2020. 91 day device clinic remote transmission6/21/2022.   EP/Cardiology Office Visits:10/19/2020 Mamie Nick, [place].  12/16/2020 with Dr Rayann Heman.  Copy of ICM check sent to Dr.Allred.    3 month ICM trend: 08/08/2020.    1 Year ICM trend:       [name], RN 08/09/2020 12:20 PM

## 2020-08-23 ENCOUNTER — Other Ambulatory Visit (HOSPITAL_COMMUNITY): Payer: Self-pay | Admitting: Internal Medicine

## 2020-08-23 DIAGNOSIS — I5022 Chronic systolic (congestive) heart failure: Secondary | ICD-10-CM

## 2020-09-15 ENCOUNTER — Telehealth (HOSPITAL_COMMUNITY): Payer: Self-pay

## 2020-09-15 NOTE — Telephone Encounter (Signed)
Contacted patient regarding concerns about Wilder Glade - she states she has had two UTIs over the last 9 months or so requiring doxycycline (prescribed by an Urgent Care). She states that usually she has felt these come on at the same time as sinus infections. She was informed that "she now has diabetes because there was sugar in my urine". However, she states she went to get A1c check and this was <6%.   Informed patient about MOA of Wilder Glade and explaining risks of UTI. Informed her that a diabetes diagnosis would not be based on sugar in her urine and that A1c <6% indicates no diabetes. Encouraged her to complete her antibiotic (suggesting that she could hold [place] until completion of antibiotic) and to closely monitor for recurrent UTIs.   CPhT will work on prior authorization renewal for [place].

## 2020-09-16 ENCOUNTER — Other Ambulatory Visit (HOSPITAL_COMMUNITY): Payer: Self-pay

## 2020-09-16 ENCOUNTER — Telehealth (HOSPITAL_COMMUNITY): Payer: Self-pay | Admitting: Pharmacy Technician

## 2020-09-16 NOTE — Telephone Encounter (Addendum)
Patient Advocate Encounter   Received notification from [place] that prior authorization for Wilder Glade is required.   PA submitted on CoverMyMeds Key [id number] Status is pending   Will continue to follow.  Advanced Heart Failure Patient Advocate Encounter  Called patient's insurance requesting update with [place] request. Explained that the request on CMM did not load clinical questions and would not let me resubmit. Was able to submit over the phone.   Prior Authorization for Wilder Glade has been approved.    Effective dates: 10/04/20 through 10/04/21  Charlann Boxer, CPhT

## 2020-09-19 ENCOUNTER — Ambulatory Visit (INDEPENDENT_AMBULATORY_CARE_PROVIDER_SITE_OTHER): Payer: Federal, State, Local not specified - PPO

## 2020-09-19 DIAGNOSIS — I5022 Chronic systolic (congestive) heart failure: Secondary | ICD-10-CM | POA: Diagnosis not present

## 2020-09-19 DIAGNOSIS — Z95810 Presence of automatic (implantable) cardiac defibrillator: Secondary | ICD-10-CM

## 2020-09-20 NOTE — Progress Notes (Signed)
EPIC Encounter for ICM Monitoring  Patient Name: Loretta Thomas is a 70 y.o. female Date: 09/20/2020 Primary Care Physican: Rossie Muskrat, DO Primary Cardiologist:Bensimhon Electrophysiologist:Allred Bi-V Pacing:>99% 09/20/2020 Weight: 193 lbs    Spoke with patient and reports having a 3 lb weight gain in the last few days. She has fluid in ears, sinus congestion and phlegm.  She is taking antibiotic for UTI and sinus infection.  She feels like some of her symptoms are related to HF.     Corvue thoracic impedancesuggesting possible fluid accumulation starting 09/18/2020.  Prescribed:  Furosemide40 mg take 1 tabletevery other day.  Labs: 02/02/2020 Creatinine 0.74, BUN 15, Potassium 4.2, Sodium 140, GFR 83-96 09/30/2019 Creatinine 0.83, BUN 10, Potassium 4.1, Sodium 139, GFR >60 07/21/2019 Creatinine 0.77, BUN 9, Potassium 4.3, Sodium 140, GFR >60 A complete set of results can be found in Results Review.  Recommendations:Advised to take Furosemide 60 mg x 2 days and then resume 40 mg every other day.   Follow-up plan: ICM clinic phone appointment on6/01/2021 (manual) to recheck fluid levels. 91 day device clinic remote transmission6/21/2022.   EP/Cardiology Office Visits:10/19/2020 Mamie Nick, [place].  12/16/2020 with Dr Rayann Heman.  Copy of ICM check sent to Dr.Allred and Dr Haroldine Laws as Juluis Rainier.  3 month ICM trend: 09/20/2020.    1 Year ICM trend:       [name], RN 09/20/2020 9:48 AM

## 2020-09-20 NOTE — Progress Notes (Signed)
Received: Today Bensimhon, Shaune Pascal, MD  Rosenda Geffrard Panda, RN Thank you!

## 2020-09-21 ENCOUNTER — Other Ambulatory Visit: Payer: Self-pay | Admitting: *Deleted

## 2020-09-21 MED ORDER — CARVEDILOL 3.125 MG PO TABS: 3.1250 mg | ORAL_TABLET | Freq: Two times a day (BID) | ORAL | 6 refills | Status: DC

## 2020-09-22 ENCOUNTER — Telehealth (HOSPITAL_COMMUNITY): Payer: Self-pay | Admitting: *Deleted

## 2020-09-22 NOTE — Telephone Encounter (Signed)
Pt called to report she restarted farxiga and has completed doxycycline.

## 2020-09-23 ENCOUNTER — Ambulatory Visit (INDEPENDENT_AMBULATORY_CARE_PROVIDER_SITE_OTHER): Payer: Federal, State, Local not specified - PPO

## 2020-09-23 DIAGNOSIS — Z95810 Presence of automatic (implantable) cardiac defibrillator: Secondary | ICD-10-CM

## 2020-09-23 DIAGNOSIS — I5022 Chronic systolic (congestive) heart failure: Secondary | ICD-10-CM

## 2020-09-23 NOTE — Progress Notes (Signed)
EPIC Encounter for ICM Monitoring  Patient Name: Loretta Thomas is a 70 y.o. female Date: 09/23/2020 Primary Care Physican: Rossie Muskrat, DO Primary Cardiologist: [place] Electrophysiologist: Allred Bi-V Pacing:   >99%       09/20/2020 Weight: 193 lbs  09/23/2020 Weight: 189-190 lbs                                                 Spoke with patient and reports feeling well at this time. Heart failure questions reviewed. Pt's weight returned to normal after taking extra Furosemide.     Corvue thoracic impedance suggesting fluid levels returned to normal after taking extra Furosemide.   Prescribed:  Furosemide 40 mg take 1 tablet every other day.    Labs: 02/02/2020 Creatinine 0.74, BUN 15, Potassium 4.2, Sodium 140, GFR 83-96 09/30/2019 Creatinine 0.83, BUN 10, Potassium 4.1, Sodium 139, GFR >60 07/21/2019 Creatinine 0.77, BUN 9,   Potassium 4.3, Sodium 140, GFR >60 A complete set of results can be found in Results Review.   Recommendations:  No changes and encouraged to call if experiencing any fluid symptoms.   Follow-up plan: ICM clinic phone appointment on 09/20/16/2022.   91 day device clinic remote transmission 10/04/2020.     EP/Cardiology Office Visits:  10/19/2020 with Oda Kilts, [place].  12/16/2020 with Dr Rayann Heman.   Copy of ICM check sent to Dr. Rayann Heman.   3 month ICM trend: 09/23/2020.    1 Year ICM trend:       [name], RN 09/23/2020 10:50 AM

## 2020-09-27 ENCOUNTER — Other Ambulatory Visit (HOSPITAL_COMMUNITY): Payer: Self-pay

## 2020-10-04 ENCOUNTER — Ambulatory Visit (INDEPENDENT_AMBULATORY_CARE_PROVIDER_SITE_OTHER): Payer: Federal, State, Local not specified - PPO

## 2020-10-04 ENCOUNTER — Other Ambulatory Visit (HOSPITAL_COMMUNITY): Payer: Self-pay

## 2020-10-04 DIAGNOSIS — I5022 Chronic systolic (congestive) heart failure: Secondary | ICD-10-CM

## 2020-10-04 DIAGNOSIS — I428 Other cardiomyopathies: Secondary | ICD-10-CM

## 2020-10-05 LAB — CUP PACEART REMOTE DEVICE CHECK
Battery Remaining Longevity: 51 mo
Battery Remaining Percentage: 64 %
Battery Voltage: 2.95 V
Brady Statistic AP VP Percent: 1 %
Brady Statistic AP VS Percent: 1 %
Brady Statistic AS VP Percent: 99 %
Brady Statistic AS VS Percent: 1 %
Brady Statistic RA Percent Paced: 1 %
Date Time Interrogation Session: 20220621025258
HighPow Impedance: 82 Ohm
HighPow Impedance: 82 Ohm
Implantable Lead Implant Date: 20190920
Implantable Lead Implant Date: 20190920
Implantable Lead Implant Date: 20190920
Implantable Lead Location: 753858
Implantable Lead Location: 753859
Implantable Lead Location: 753860
Implantable Pulse Generator Implant Date: 20190920
Lead Channel Impedance Value: 1200 Ohm
Lead Channel Impedance Value: 480 Ohm
Lead Channel Impedance Value: 560 Ohm
Lead Channel Pacing Threshold Amplitude: 0.5 V
Lead Channel Pacing Threshold Amplitude: 0.75 V
Lead Channel Pacing Threshold Amplitude: 1.125 V
Lead Channel Pacing Threshold Pulse Width: 0.5 ms
Lead Channel Pacing Threshold Pulse Width: 0.5 ms
Lead Channel Pacing Threshold Pulse Width: 0.5 ms
Lead Channel Sensing Intrinsic Amplitude: 11.8 mV
Lead Channel Sensing Intrinsic Amplitude: 4.3 mV
Lead Channel Setting Pacing Amplitude: 2 V
Lead Channel Setting Pacing Amplitude: 2 V
Lead Channel Setting Pacing Amplitude: 2.125
Lead Channel Setting Pacing Pulse Width: 0.5 ms
Lead Channel Setting Pacing Pulse Width: 0.5 ms
Lead Channel Setting Sensing Sensitivity: 0.5 mV
Pulse Gen Serial Number: 9833194

## 2020-10-10 ENCOUNTER — Other Ambulatory Visit (HOSPITAL_COMMUNITY): Payer: Self-pay | Admitting: Internal Medicine

## 2020-10-18 ENCOUNTER — Telehealth (HOSPITAL_COMMUNITY): Payer: Self-pay | Admitting: *Deleted

## 2020-10-18 NOTE — Telephone Encounter (Signed)
Left VM stating she tested positive for covid her pcp wants to prescribe LAGEVRIOT (molnupiravir) EUA. Pt wants to know if it is ok to take with her other medications and heart condition.  Routed to Kerby Nora, [facility] for advice

## 2020-10-19 ENCOUNTER — Encounter: Payer: Federal, State, Local not specified - PPO | Admitting: Student

## 2020-10-19 ENCOUNTER — Other Ambulatory Visit (HOSPITAL_COMMUNITY): Payer: Federal, State, Local not specified - PPO

## 2020-10-19 NOTE — Telephone Encounter (Signed)
Left detailed VM.  

## 2020-10-24 NOTE — Progress Notes (Signed)
Remote ICD transmission.   

## 2020-10-31 ENCOUNTER — Ambulatory Visit (INDEPENDENT_AMBULATORY_CARE_PROVIDER_SITE_OTHER): Payer: Federal, State, Local not specified - PPO

## 2020-10-31 DIAGNOSIS — I5022 Chronic systolic (congestive) heart failure: Secondary | ICD-10-CM | POA: Diagnosis not present

## 2020-10-31 DIAGNOSIS — Z95810 Presence of automatic (implantable) cardiac defibrillator: Secondary | ICD-10-CM

## 2020-11-04 NOTE — Progress Notes (Signed)
EPIC Encounter for ICM Monitoring  Patient Name: Loretta Thomas is a 70 y.o. female Date: 11/04/2020 Primary Care Physican: Rossie Muskrat, DO Primary Cardiologist: [place] Electrophysiologist: Allred Bi-V Pacing:   >99%       09/20/2020 Weight: 193 lbs  09/23/2020 Weight: 189-190 lbs  AT/AF Burden: <1% (Extended diagnostics 12 sec episode September 30, 2020)                                                Spoke with patient and she reports shortness of breath for the last few days.  Pt is on beach vacation this week and my be contributing to decreased impedance.      Corvue thoracic impedance suggesting possible fluid accumulation starting 10/30/2020.   Prescribed:  Furosemide 40 mg take 1 tablet every other day. Spironolactone 25 mg take 0.5 tablet (12.5 mg total) by mouth daily Farxiga '10mg'$  take 1 tablet by mouth daily    Labs: 02/02/2020 Creatinine 0.74, BUN 15, Potassium 4.2, Sodium 140, GFR 83-96 09/30/2019 Creatinine 0.83, BUN 10, Potassium 4.1, Sodium 139, GFR >60 07/21/2019 Creatinine 0.77, BUN 9,   Potassium 4.3, Sodium 140, GFR >60 A complete set of results can be found in Results Review.   Recommendations:  Advised to take Furosemide 40 mg x 3 consecutive days and recheck fluid levels on 7/25.   Follow-up plan: ICM clinic phone appointment on 11/07/2020 to recheck fluid levels.   91 day device clinic remote transmission 01/03/2021.     EP/Cardiology Office Visits:  11/09/2020 with Oda Kilts, [place].  12/16/2020 with Dr Rayann Heman.  12/28/2020 with Dr Haroldine Laws.    Copy of ICM check sent to Dr. Rayann Heman.   3 month ICM trend: 11/03/2020.    1 Year ICM trend:       [name], RN 11/04/2020 7:52 AM

## 2020-11-07 ENCOUNTER — Ambulatory Visit (INDEPENDENT_AMBULATORY_CARE_PROVIDER_SITE_OTHER): Payer: Federal, State, Local not specified - PPO

## 2020-11-07 ENCOUNTER — Telehealth: Payer: Self-pay

## 2020-11-07 DIAGNOSIS — I5022 Chronic systolic (congestive) heart failure: Secondary | ICD-10-CM

## 2020-11-07 DIAGNOSIS — Z95810 Presence of automatic (implantable) cardiac defibrillator: Secondary | ICD-10-CM

## 2020-11-07 NOTE — Telephone Encounter (Signed)
Pt called and states she just started bleeding from her vagina. Pt denies pain and states, "it's not much but I have not had a period inn 20 years." Recommended pt f/u with GYN. Pt states she has an appt Wednesday. Pt knows to call with any further concerns.

## 2020-11-07 NOTE — Telephone Encounter (Signed)
Call to patient and requested remote transmission for fluid level review.

## 2020-11-07 NOTE — Progress Notes (Signed)
EPIC Encounter for ICM Monitoring  Patient Name: Loretta Thomas is a 70 y.o. female Date: 11/07/2020 Primary Care Physican: Rossie Muskrat, DO Primary Cardiologist: [place] Electrophysiologist: Allred Bi-V Pacing:   >99%       09/23/2020 Weight: 189-190 lbs   AT/AF Burden: <1%                                                Spoke with patient and she reports shortness of breath has resolved.  She is feeling better.   Corvue thoracic impedance suggesting fluid levels returned to normal but is showing dryness as a result of taking extra Furosemide.   Prescribed:  Furosemide 40 mg take 1 tablet every other day. Spironolactone 25 mg take 0.5 tablet (12.5 mg total) by mouth daily Farxiga '10mg'$  take 1 tablet by mouth daily    Labs: 02/02/2020 Creatinine 0.74, BUN 15, Potassium 4.2, Sodium 140, GFR 83-96 09/30/2019 Creatinine 0.83, BUN 10, Potassium 4.1, Sodium 139, GFR >60 07/21/2019 Creatinine 0.77, BUN 9,   Potassium 4.3, Sodium 140, GFR >60 A complete set of results can be found in Results Review.   Recommendations:  No changes and encouraged to call if experiencing any fluid symptoms.   Follow-up plan: ICM clinic phone appointment on 12/05/2020.   91 day device clinic remote transmission 01/03/2021.     EP/Cardiology Office Visits:  11/09/2020 with Oda Kilts, [place].  12/16/2020 with Dr Rayann Heman.  12/28/2020 with Dr Haroldine Laws.   Copy of ICM check sent to Dr. Rayann Heman..   3 month ICM trend: 11/07/2020.    1 Year ICM trend:       [name], RN 11/07/2020 9:46 AM

## 2020-11-08 NOTE — Progress Notes (Signed)
Electrophysiology Office Note Date: 11/09/2020  ID:  Loretta Thomas, DOB Jul 23, 1950, MRN [id number]  PCP: Rossie Muskrat, DO Primary Cardiologist: None Electrophysiologist: Thompson Grayer, MD   CC: Routine ICD follow-up  Loretta Thomas is a 70 y.o. female seen today for Thompson Grayer, MD for routine electrophysiology followup.  Since last being seen in our clinic the patient reports doing about the same. Reports fatigue and SOB with mild to moderate exertion. She has OSA diagnosis x 1 year at least but not using CPAP. States waiting for Medicare (paperwork pending). She is able to do her ADLs but finds herself fatigued for the majority of the day. she denies chest pain, palpitations, dyspnea, PND, orthopnea, nausea, vomiting, dizziness, syncope, edema, weight gain, or early satiety. She has not had ICD shocks.   Device History: St. Jude BiV ICD implanted 12/2017 for CHF/ LBBB  Past Medical History:  Diagnosis Date   Anxiety    Bladder cystocele    Breast cancer ([place])    s/p L breast cancer removal   Bundle branch block, left    diagnosed 03/2012 followed by cath showing only minimal CAD   Bursitis    Cancer ([place])    right breast   Capsulitis    CHF (congestive heart failure) ([place])    Diverticulosis    Elevated liver enzymes 2007   History of IBS    Hyperlipidemia    Hypertension    Nonischemic cardiomyopathy ([place])    pernicious anemia    Past Surgical History:  Procedure Laterality Date   ANKLE FRACTURE SURGERY     BIV ICD INSERTION CRT-D N/A 01/03/2018    [facility]. Jude Medical [place] Assura MP model [phone] (serial  Number [phone]) biventricular ICD for primary prevention of sudden death   BREAST BIOPSY     right breast   BREAST RECONSTRUCTION WITH PLACEMENT OF TISSUE EXPANDER AND FLEX HD (ACELLULAR HYDRATED DERMIS) Right 01/28/2014   Procedure: RIGHT BREAST RECONSTRUCTION WITH PLACEMENT OF TISSUE EXPANDER;  Surgeon: Crissie Reese, MD;  Location: [facility];  Service: Plastics;   Laterality: Right;   CARDIAC CATHETERIZATION     x3; 03/18/12 [facility]): 10-20% pLAD, <20% ostial LCX, 20% mPLA, EF 60%.     CHOLECYSTECTOMY     COLONOSCOPY     x4   DILATION AND CURETTAGE OF UTERUS     MASTECTOMY Right    malignant   REDUCTION MAMMAPLASTY Left    RHINOPLASTY     SIMPLE MASTECTOMY WITH AXILLARY SENTINEL NODE BIOPSY Right 01/28/2014   Procedure: RIGHT TOTAL MASTECTOMY WITH  RIGHT AXILLARY SENTINEL NODE BIOPSY;  Surgeon: Excell Seltzer, MD;  Location: [facility];  Service: General;  Laterality: Right;   TONSILLECTOMY AND ADENOIDECTOMY     TUBAL LIGATION      Current Outpatient Medications  Medication Sig Dispense Refill   acetaminophen (TYLENOL) 500 MG tablet Take 500 mg by mouth every 6 (six) hours as needed.      Calcium Carbonate-Vitamin D 600-400 MG-UNIT tablet Take 1 tablet by mouth 2 (two) times daily.     carvedilol (COREG) 3.125 MG tablet Take 1 tablet (3.125 mg total) by mouth 2 (two) times daily with a meal. 60 tablet 6   cephALEXin (KEFLEX) 500 MG capsule Take 500 mg by mouth 4 (four) times daily.     cetirizine (ZYRTEC) 10 MG tablet Take 10 mg by mouth daily.      cyanocobalamin (,VITAMIN B-12,) 1000 MCG/ML injection Inject 1 mL (1,000 mcg total) into  the muscle every 30 (thirty) days. 10 mL 0   dapagliflozin propanediol (FARXIGA) 10 MG TABS tablet TAKE 1 TABLET BY MOUTH DAILY BEFORE BREAKFAST. 30 tablet 11   furosemide (LASIX) 40 MG tablet TAKE 1 TABLET BY MOUTH EVERY OTHER DAY 45 tablet 3   letrozole (FEMARA) 2.5 MG tablet Take 1 tablet (2.5 mg total) by mouth daily. 90 tablet 3   loratadine (CLARITIN) 10 MG tablet Take 10 mg by mouth daily.     losartan (COZAAR) 50 MG tablet Take 1 tablet (50 mg total) by mouth daily. 90 tablet 1   rosuvastatin (CRESTOR) 5 MG tablet TAKE 1 TABLET (5 MG TOTAL) BY MOUTH DAILY. 90 tablet 2   spironolactone (ALDACTONE) 25 MG tablet Take 0.5 tablets (12.5 mg total) by mouth daily. 90 tablet 3   venlafaxine  XR (EFFEXOR-XR) 75 MG 24 hr capsule Take 1 capsule (75 mg total) by mouth daily with breakfast. 90 capsule 3   Vitamin D, Ergocalciferol, (DRISDOL) 50000 UNITS CAPS capsule Take 50,000 Units by mouth every 7 (seven) days. Fridays     No current facility-administered medications for this visit.    Allergies:   Codeine, Demerol [meperidine], Hydrocodone, and Lortab [hydrocodone-acetaminophen]   Social History: Social History   Socioeconomic History   Marital status: Married    Spouse name: Not on file   Number of children: Not on file   Years of education: Not on file   Highest education level: Not on file  Occupational History   Not on file  Tobacco Use   Smoking status: Never   Smokeless tobacco: Never  Vaping Use   Vaping Use: Never used  Substance and Sexual Activity   Alcohol use: No   Drug use: No   Sexual activity: Yes  Other Topics Concern   Not on file  Social History Narrative   Lives in [place] Determinants of Health   Financial Resource Strain: Not on file  Food Insecurity: Not on file  Transportation Needs: Not on file  Physical Activity: Not on file  Stress: Not on file  Social Connections: Not on file  Intimate Partner Violence: Not on file    Family History: Family History  Problem Relation Age of Onset   Cancer Sister        skin and colon x2   Cancer Brother        esophageal   Cancer Father 8       mouth   Cancer Maternal Aunt 55       breast   Breast cancer Maternal Aunt 55   Cancer Maternal Grandmother 73       ovarian   Cancer Cousin 58       mat 1st cousin with breast   Breast cancer Cousin 55   Cancer Paternal Grandfather 83       lung    Review of Systems: All other systems reviewed and are otherwise negative except as noted above.   Physical Exam: Vitals:   11/09/20 0924  BP: 130/80  Pulse: 82  SpO2: 98%  Weight: 191 lb 3.2 oz (86.7 kg)  Height: '5\' 7"'$  (1.702 m)     GEN- The patient is well appearing,  alert and oriented x 3 today.   HEENT: normocephalic, atraumatic; sclera clear, conjunctiva pink; hearing intact; oropharynx clear; neck supple, no JVP Lymph- no cervical lymphadenopathy Lungs- Clear to ausculation bilaterally, normal work of breathing.  No wheezes, rales, rhonchi Heart- Regular rate and rhythm,  no murmurs, rubs or gallops, PMI not laterally displaced GI- soft, non-tender, non-distended, bowel sounds present, no hepatosplenomegaly Extremities- no clubbing or cyanosis. No edema; DP/PT/radial pulses 2+ bilaterally MS- no significant deformity or atrophy Skin- warm and dry, no rash or lesion; ICD pocket well healed Psych- euthymic mood, full affect Neuro- strength and sensation are intact  ICD interrogation- reviewed in detail today,  See PACEART report  EKG:  EKG is not ordered today.   Recent Labs: No results found for requested labs within last 8760 hours.   Wt Readings from Last 3 Encounters:  11/09/20 191 lb 3.2 oz (86.7 kg)  07/26/20 189 lb 6.4 oz (85.9 kg)  06/21/20 186 lb 3.2 oz (84.5 kg)     Other studies Reviewed: Additional studies/ records that were reviewed today include: Previous EP office notes and ICM notes   Assessment and Plan:  1.  Chronic systolic dysfunction s/p St. Jude CRT-D  euvolemic today Stable on an appropriate medical regimen Normal ICD function See Pace Art report No changes today Followed in ICM clinic.  Multi-site pacing turned on 06/17/20. She has persistent NYHA III+ symptoms despite AV and V-V optimization with MSP.  Unfortunately her Echo was miss-timed and will occur AFTER our visit today.  If EF remains low would turn off multi-site pacing to preserve battery life and consider for Barostim procedure.  Labs today.  Intolerant to [place] with rash  2. HTN Stable on current medications  3. Vaginal bleeding In setting of UTI as well.  She is a CA survivor and is quite worried about this. She has follow up with OB this  week  Loretta Thomas's heart failure has failed to improve despite titration of guideline directed medication such that he qualifies for the [facility] NEO device. The following information from the patient's medical record supports the medical necessity of this procedure for my patient, consistent with the FDA on-label indication for BAROSTIM NEO:  ? LVEF to be re-confirmed today by Echo.   ? NT-proBNP of <1600 pg/ml  = Labs to be drawn today, 11/09/20  ?Symptomatic despite medication management of: diuretic, beta blocker, ACEi/ARB/ARNi, Aldosterone inhibitor, and SGLT2 inhibitor as evidenced by symptoms below.   ? This patients signs and symptoms of heart failure include "dyspnea with mild to moderate exertion, orthopnea, edema, fatigue, weakness, and dizziness   ? NYHA Congestive Heart Failure Classification: III  ? Recent hospitalization for Heart Failure on (not applicable for this patient)   This patient continues to have NYHA III symptoms despite cardiac resynchronization therapy with multiple attempts at optimization. She would be a candidate for Barostim. She has multiple other issues with on-going work up, so may need to defer to a later date, but will discuss further based on Echo.    Current medicines are reviewed at length with the patient today.   The patient does not have concerns regarding her medicines.  The following changes were made today:  None  Labs/ tests ordered today include:  Orders Placed This Encounter  Procedures   Basic metabolic panel   CBC   Pro b natriuretic peptide (BNP)   Disposition:   Follow up me tentatively in 3-4 weeks to turn off Multi-Site Pacing and discuss Barostim further if EF remains low.   Loretta Lefevre, PA-C  11/09/2020 10:04 AM  [facility] [address] [place] [address] [phone] (office) [phone] (fax)

## 2020-11-09 ENCOUNTER — Encounter: Payer: Self-pay | Admitting: Student

## 2020-11-09 ENCOUNTER — Other Ambulatory Visit: Payer: Self-pay

## 2020-11-09 ENCOUNTER — Ambulatory Visit: Payer: Federal, State, Local not specified - PPO | Admitting: Student

## 2020-11-09 ENCOUNTER — Ambulatory Visit (HOSPITAL_COMMUNITY): Payer: Federal, State, Local not specified - PPO | Attending: Internal Medicine

## 2020-11-09 VITALS — BP 130/80 | HR 82 | Ht 67.0 in | Wt 191.2 lb

## 2020-11-09 DIAGNOSIS — I519 Heart disease, unspecified: Secondary | ICD-10-CM

## 2020-11-09 DIAGNOSIS — Z95810 Presence of automatic (implantable) cardiac defibrillator: Secondary | ICD-10-CM | POA: Diagnosis not present

## 2020-11-09 DIAGNOSIS — I5022 Chronic systolic (congestive) heart failure: Secondary | ICD-10-CM | POA: Diagnosis not present

## 2020-11-09 DIAGNOSIS — I428 Other cardiomyopathies: Secondary | ICD-10-CM

## 2020-11-09 DIAGNOSIS — I10 Essential (primary) hypertension: Secondary | ICD-10-CM

## 2020-11-09 LAB — CUP PACEART INCLINIC DEVICE CHECK
Battery Remaining Longevity: 50 mo
Brady Statistic RA Percent Paced: 0.07 %
Brady Statistic RV Percent Paced: 99.94 %
Date Time Interrogation Session: 20220727102434
HighPow Impedance: 87.75 Ohm
Implantable Lead Implant Date: 20190920
Implantable Lead Implant Date: 20190920
Implantable Lead Implant Date: 20190920
Implantable Lead Location: 753858
Implantable Lead Location: 753859
Implantable Lead Location: 753860
Implantable Pulse Generator Implant Date: 20190920
Lead Channel Impedance Value: 1250 Ohm
Lead Channel Impedance Value: 412.5 Ohm
Lead Channel Impedance Value: 587.5 Ohm
Lead Channel Pacing Threshold Amplitude: 0.5 V
Lead Channel Pacing Threshold Amplitude: 0.5 V
Lead Channel Pacing Threshold Amplitude: 0.625 V
Lead Channel Pacing Threshold Amplitude: 1 V
Lead Channel Pacing Threshold Pulse Width: 0.5 ms
Lead Channel Pacing Threshold Pulse Width: 0.5 ms
Lead Channel Pacing Threshold Pulse Width: 0.5 ms
Lead Channel Pacing Threshold Pulse Width: 0.5 ms
Lead Channel Sensing Intrinsic Amplitude: 11.8 mV
Lead Channel Sensing Intrinsic Amplitude: 4.3 mV
Lead Channel Setting Pacing Amplitude: 2 V
Lead Channel Setting Pacing Amplitude: 2 V
Lead Channel Setting Pacing Amplitude: 2 V
Lead Channel Setting Pacing Pulse Width: 0.5 ms
Lead Channel Setting Pacing Pulse Width: 0.5 ms
Lead Channel Setting Sensing Sensitivity: 0.5 mV
Pulse Gen Serial Number: 9833194

## 2020-11-09 LAB — BASIC METABOLIC PANEL
BUN/Creatinine Ratio: 14 (ref 12–28)
BUN: 11 mg/dL (ref 8–27)
CO2: 21 mmol/L (ref 20–29)
Calcium: 9.8 mg/dL (ref 8.7–10.3)
Chloride: 97 mmol/L (ref 96–106)
Creatinine, Ser: 0.77 mg/dL (ref 0.57–1.00)
Glucose: 124 mg/dL — ABNORMAL HIGH (ref 65–99)
Potassium: 3.7 mmol/L (ref 3.5–5.2)
Sodium: 138 mmol/L (ref 134–144)
eGFR: 84 mL/min/{1.73_m2} (ref >59)

## 2020-11-09 LAB — ECHOCARDIOGRAM COMPLETE
Area-P 1/2: 2.95 cm2
Height: 67 in
S' Lateral: 4.1 cm
Weight: 3059.2 oz

## 2020-11-09 LAB — CBC
Hematocrit: 38.4 % (ref 34.0–46.6)
Hemoglobin: 13.1 g/dL (ref 11.1–15.9)
MCH: 30.5 pg (ref 26.6–33.0)
MCHC: 34.1 g/dL (ref 31.5–35.7)
MCV: 90 fL (ref 79–97)
Platelets: 189 10*3/uL (ref 150–450)
RBC: 4.29 x10E6/uL (ref 3.77–5.28)
RDW: 13.8 % (ref 11.7–15.4)
WBC: 6.5 10*3/uL (ref 3.4–10.8)

## 2020-11-09 LAB — PRO B NATRIURETIC PEPTIDE: NT-Pro BNP: 29 pg/mL (ref 0–301)

## 2020-11-09 NOTE — Patient Instructions (Signed)
Medication Instructions:  Your physician recommends that you continue on your current medications as directed. Please refer to the Current Medication list given to you today.  *If you need a refill on your cardiac medications before your next appointment, please call your pharmacy*   Lab Work: TODAY: BMET, CBC, BNP  If you have labs (blood work) drawn today and your tests are completely normal, you will receive your results only by: [place] (if you have MyChart) OR A paper copy in the mail If you have any lab test that is abnormal or we need to change your treatment, we will call you to review the results.   Follow-Up: At [facility], you and your health needs are our priority.  As part of our continuing mission to provide you with exceptional heart care, we have created designated Provider Care Teams.  These Care Teams include your primary Cardiologist (physician) and Advanced Practice Providers (APPs -  Physician Assistants and Nurse Practitioners) who all work together to provide you with the care you need, when you need it.  We recommend signing up for the patient portal called "MyChart".  Sign up information is provided on this After Visit Summary.  MyChart is used to connect with patients for Virtual Visits (Telemedicine).  Patients are able to view lab/test results, encounter notes, upcoming appointments, etc.  Non-urgent messages can be sent to your provider as well.   To learn more about what you can do with MyChart, go to [email or web address].    Your next appointment:   As scheduled

## 2020-11-10 ENCOUNTER — Encounter: Payer: Self-pay | Admitting: *Deleted

## 2020-11-16 ENCOUNTER — Other Ambulatory Visit (HOSPITAL_COMMUNITY): Payer: Federal, State, Local not specified - PPO

## 2020-11-16 ENCOUNTER — Encounter: Payer: Federal, State, Local not specified - PPO | Admitting: Student

## 2020-11-21 ENCOUNTER — Telehealth: Payer: Self-pay

## 2020-11-21 NOTE — Telephone Encounter (Signed)
Returned call to patient per voice mail request.  Patient stated she feels really tired and having shortness of breath for past week. She is unsure if it is related to the heat outside.  She appreciated the call back and will call back if symptoms worsen.    CorVue impedance suggesting normal fluid levels.

## 2020-11-28 NOTE — Progress Notes (Signed)
Electrophysiology Office Note Date: 11/28/2020  ID:  Loretta Thomas, DOB 11/27/1950, MRN [id number]  PCP: Rossie Muskrat, DO Primary Cardiologist: None Electrophysiologist: Thompson Grayer, MD   CC: Routine ICD follow-up  Loretta Thomas is a 70 y.o. female seen today for Thompson Grayer, MD for routine electrophysiology followup.    At last visit we briefly discussed Barostim. We were awaiting updating echo with MSP turned on to discuss further.  Echo 11/09/2020 LVEF 30-35%, Grade 1 DD  Since last visit, patient reports doing about the same. She does feel marginal improvement since Multi-Site pacing was turned on for her CRT device. She is still working to get approved for [employer]. Continues to maintain BCBS in the meantime. Remains SOB with more than mild exertion. Main limitation is fatigue. She can do her ADLs, but then is tired for the rest of the day. She is interested in BAT therapy.  Device History: St. Jude BiV ICD implanted 12/2017 for CHF/ LBBB  Past Medical History:  Diagnosis Date   Anxiety    Bladder cystocele    Breast cancer ([place])    s/p L breast cancer removal   Bundle branch block, left    diagnosed 03/2012 followed by cath showing only minimal CAD   Bursitis    Cancer ([place])    right breast   Capsulitis    CHF (congestive heart failure) ([place])    Diverticulosis    Elevated liver enzymes 2007   History of IBS    Hyperlipidemia    Hypertension    Nonischemic cardiomyopathy ([place])    pernicious anemia    Past Surgical History:  Procedure Laterality Date   ANKLE FRACTURE SURGERY     BIV ICD INSERTION CRT-D N/A 01/03/2018    [facility]. Jude [employer] [place] [employer] MP model [phone] (serial  Number [id number]) biventricular ICD for primary prevention of sudden death   BREAST BIOPSY     right breast   BREAST RECONSTRUCTION WITH PLACEMENT OF TISSUE EXPANDER AND FLEX HD (ACELLULAR HYDRATED DERMIS) Right 01/28/2014   Procedure: RIGHT BREAST RECONSTRUCTION WITH PLACEMENT OF  TISSUE EXPANDER;  Surgeon: Crissie Reese, MD;  Location: [place];  Service: Plastics;  Laterality: Right;   CARDIAC CATHETERIZATION     x3; 03/18/12 [facility]): 10-20% pLAD, <20% ostial LCX, 20% mPLA, EF 60%.     CHOLECYSTECTOMY     COLONOSCOPY     x4   DILATION AND CURETTAGE OF UTERUS     MASTECTOMY Right    malignant   REDUCTION MAMMAPLASTY Left    RHINOPLASTY     SIMPLE MASTECTOMY WITH AXILLARY SENTINEL NODE BIOPSY Right 01/28/2014   Procedure: RIGHT TOTAL MASTECTOMY WITH  RIGHT AXILLARY SENTINEL NODE BIOPSY;  Surgeon: Excell Seltzer, MD;  Location: [place];  Service: General;  Laterality: Right;   TONSILLECTOMY AND ADENOIDECTOMY     TUBAL LIGATION      Current Outpatient Medications  Medication Sig Dispense Refill   acetaminophen (TYLENOL) 500 MG tablet Take 500 mg by mouth every 6 (six) hours as needed.      Calcium Carbonate-Vitamin D 600-400 MG-UNIT tablet Take 1 tablet by mouth 2 (two) times daily.     carvedilol (COREG) 3.125 MG tablet Take 1 tablet (3.125 mg total) by mouth 2 (two) times daily with a meal. 60 tablet 6   cephALEXin (KEFLEX) 500 MG capsule Take 500 mg by mouth 4 (four) times daily.     cetirizine (ZYRTEC) 10 MG tablet Take 10 mg by mouth daily.  cyanocobalamin (,VITAMIN B-12,) 1000 MCG/ML injection Inject 1 mL (1,000 mcg total) into the muscle every 30 (thirty) days. 10 mL 0   dapagliflozin propanediol (FARXIGA) 10 MG TABS tablet TAKE 1 TABLET BY MOUTH DAILY BEFORE BREAKFAST. 30 tablet 11   furosemide (LASIX) 40 MG tablet TAKE 1 TABLET BY MOUTH EVERY OTHER DAY 45 tablet 3   letrozole (FEMARA) 2.5 MG tablet Take 1 tablet (2.5 mg total) by mouth daily. 90 tablet 3   loratadine (CLARITIN) 10 MG tablet Take 10 mg by mouth daily.     losartan (COZAAR) 50 MG tablet Take 1 tablet (50 mg total) by mouth daily. 90 tablet 1   rosuvastatin (CRESTOR) 5 MG tablet TAKE 1 TABLET (5 MG TOTAL) BY MOUTH DAILY. 90 tablet 2   spironolactone (ALDACTONE) 25 MG  tablet Take 0.5 tablets (12.5 mg total) by mouth daily. 90 tablet 3   venlafaxine XR (EFFEXOR-XR) 75 MG 24 hr capsule Take 1 capsule (75 mg total) by mouth daily with breakfast. 90 capsule 3   Vitamin D, Ergocalciferol, (DRISDOL) 50000 UNITS CAPS capsule Take 50,000 Units by mouth every 7 (seven) days. Fridays     No current facility-administered medications for this visit.    Allergies:   Codeine, Demerol [meperidine], Hydrocodone, and Lortab [hydrocodone-acetaminophen]   Social History: Social History   Socioeconomic History   Marital status: Married    Spouse name: Not on file   Number of children: Not on file   Years of education: Not on file   Highest education level: Not on file  Occupational History   Not on file  Tobacco Use   Smoking status: Never   Smokeless tobacco: Never  Vaping Use   Vaping Use: Never used  Substance and Sexual Activity   Alcohol use: No   Drug use: No   Sexual activity: Yes  Other Topics Concern   Not on file  Social History Narrative   Lives in [place] Determinants of Health   Financial Resource Strain: Not on file  Food Insecurity: Not on file  Transportation Needs: Not on file  Physical Activity: Not on file  Stress: Not on file  Social Connections: Not on file  Intimate Partner Violence: Not on file    Family History: Family History  Problem Relation Age of Onset   Cancer Sister        skin and colon x2   Cancer Brother        esophageal   Cancer Father 23       mouth   Cancer Maternal Aunt 55       breast   Breast cancer Maternal Aunt 55   Cancer Maternal Grandmother 73       ovarian   Cancer Cousin 44       mat 1st cousin with breast   Breast cancer Cousin 45   Cancer Paternal Grandfather 83       lung    Review of Systems: Review of systems complete and found to be negative unless listed in HPI.     Physical Exam: There were no vitals filed for this visit.   General:  Well appearing. No resp  difficulty. HEENT: Normal Neck: Supple. JVP 5-6. Carotids 2+ bilat; no bruits. No thyromegaly or nodule noted. Cor: PMI nondisplaced. RRR, No M/G/R noted Lungs: CTAB, normal effort. Abdomen: Soft, non-tender, non-distended, no HSM. No bruits or masses. +BS   Extremities: No cyanosis, clubbing, or rash. R and LLE no edema.  Neuro: Alert & orientedx3, cranial nerves grossly intact. moves all 4 extremities w/o difficulty. Affect pleasant   ICD interrogation- reviewed in detail today,  See PACEART report  EKG:  EKG is not ordered today.   Recent Labs: 11/09/2020: BUN 11; Creatinine, Ser 0.77; Hemoglobin 13.1; NT-Pro BNP 29; Platelets 189; Potassium 3.7; Sodium 138   Wt Readings from Last 3 Encounters:  11/09/20 191 lb 3.2 oz (86.7 kg)  07/26/20 189 lb 6.4 oz (85.9 kg)  06/21/20 186 lb 3.2 oz (84.5 kg)     Other studies Reviewed: Additional studies/ records that were reviewed today include: Previous EP office notes and ICM notes   Assessment and Plan:  1.  Chronic systolic dysfunction s/p St. Jude CRT-D  Volume status stable on exam.  Stable on an appropriate medical regimen Normal ICD function See Pace Art report No changes today Followed in ICM clinic.  Multi-site pacing turned on 06/17/20. She feels marginally better, with EF 25-30% -> 30-35%. It looks like reverting her back to previous settings would get back about 6-8 months of battery life. With some improvement in symptoms, she wishes not to change anything that would potentially make her feel 'worse'.  She does have 1V safety margin on both LV sites, so making these more narrow could probably get her a few months back as well. If remains stable consider at next visit NYHA III symptoms despite AV and V-V with MSP Echo 11/09/2020 LVEF 30-35%, Grade 1 DD Intolerant to Entresto with rash  2. HTN Stable on current medications  3. Vaginal bleeding In setting of UTI as well.  She is a CA survivor and is quite worried about this.  She has follow up with OB this week  Loretta Thomas's heart failure has failed to improve despite titration of guideline directed medication such that he qualifies for the [facility] NEO device. The following information from the patient's medical record supports the medical necessity of this procedure for my patient, consistent with the FDA on-label indication for BAROSTIM NEO:  ? Echo 11/09/2020 LVEF 30-35%, Grade 1 DD  ? NT-proBNP of <1600 pg/ml  = Labs to be drawn today, 11/09/20 -> Pro BNP 29   ?Symptomatic despite medication management of: diuretic, beta blocker, ACEi/ARB/ARNi, Aldosterone inhibitor, and SGLT2 inhibitor as evidenced by symptoms below.   ? This patients signs and symptoms of heart failure include "dyspnea with mild to moderate exertion, orthopnea, edema, fatigue, weakness, and dizziness   ? NYHA Congestive Heart Failure Classification: III  ? Recent hospitalization for Heart Failure on (not applicable for this patient)   This patient continues to have NYHA III symptoms despite cardiac resynchronization therapy with multiple attempts at optimization. She would be a candidate for Barostim. She has multiple other issues with on-going work up, so may need to defer to a later date, but will discuss further based on Echo.    Of note, patient has a R breast implant which MAY prohibit Barostim placement. Silicon type. Will inquire with [facility], and ultimately Dr. Trula Slade will need to assess site for appropriateness if decision is to move forward.  Current medicines are reviewed at length with the patient today.   The patient does not have concerns regarding her medicines.  The following changes were made today:  None  Labs/ tests ordered today include:  No orders of the defined types were placed in this encounter.  Disposition:   Follow up with EP APP in 6 months, sooner pending Barostim work up. Will  Loretta Lefevre, PA-C  11/28/2020 8:37 AM  [facility]  HeartCare [address] [place] [address] [phone] (office) [phone] (fax)

## 2020-11-30 ENCOUNTER — Other Ambulatory Visit: Payer: Self-pay

## 2020-11-30 ENCOUNTER — Ambulatory Visit: Payer: Federal, State, Local not specified - PPO | Admitting: Student

## 2020-11-30 ENCOUNTER — Encounter: Payer: Self-pay | Admitting: Student

## 2020-11-30 ENCOUNTER — Other Ambulatory Visit: Payer: Self-pay | Admitting: Student

## 2020-11-30 VITALS — BP 120/72 | HR 87 | Ht 67.0 in | Wt 191.4 lb

## 2020-11-30 DIAGNOSIS — I5022 Chronic systolic (congestive) heart failure: Secondary | ICD-10-CM

## 2020-11-30 DIAGNOSIS — Z01818 Encounter for other preprocedural examination: Secondary | ICD-10-CM

## 2020-11-30 DIAGNOSIS — I428 Other cardiomyopathies: Secondary | ICD-10-CM

## 2020-11-30 DIAGNOSIS — I10 Essential (primary) hypertension: Secondary | ICD-10-CM | POA: Diagnosis not present

## 2020-11-30 LAB — CUP PACEART INCLINIC DEVICE CHECK
Battery Remaining Longevity: 49 mo
Brady Statistic RA Percent Paced: 0.03 %
Brady Statistic RV Percent Paced: 99.96 %
Date Time Interrogation Session: 20220817113851
HighPow Impedance: 81 Ohm
Implantable Lead Implant Date: 20190920
Implantable Lead Implant Date: 20190920
Implantable Lead Implant Date: 20190920
Implantable Lead Location: 753858
Implantable Lead Location: 753859
Implantable Lead Location: 753860
Implantable Pulse Generator Implant Date: 20190920
Lead Channel Impedance Value: 1187.5 Ohm
Lead Channel Impedance Value: 387.5 Ohm
Lead Channel Impedance Value: 587.5 Ohm
Lead Channel Pacing Threshold Amplitude: 0.5 V
Lead Channel Pacing Threshold Amplitude: 0.5 V
Lead Channel Pacing Threshold Amplitude: 0.75 V
Lead Channel Pacing Threshold Amplitude: 1 V
Lead Channel Pacing Threshold Pulse Width: 0.5 ms
Lead Channel Pacing Threshold Pulse Width: 0.5 ms
Lead Channel Pacing Threshold Pulse Width: 0.5 ms
Lead Channel Pacing Threshold Pulse Width: 0.5 ms
Lead Channel Sensing Intrinsic Amplitude: 11.8 mV
Lead Channel Sensing Intrinsic Amplitude: 3.9 mV
Lead Channel Setting Pacing Amplitude: 2 V
Lead Channel Setting Pacing Amplitude: 2 V
Lead Channel Setting Pacing Amplitude: 2 V
Lead Channel Setting Pacing Pulse Width: 0.5 ms
Lead Channel Setting Pacing Pulse Width: 0.5 ms
Lead Channel Setting Sensing Sensitivity: 0.5 mV
Pulse Gen Serial Number: 9833194

## 2020-11-30 NOTE — Patient Instructions (Signed)
Medication Instructions:  Your physician recommends that you continue on your current medications as directed. Please refer to the Current Medication list given to you today.  *If you need a refill on your cardiac medications before your next appointment, please call your pharmacy*   Lab Work: None  If you have labs (blood work) drawn today and your tests are completely normal, you will receive your results only by: MyChart Message (if you have MyChart) OR A paper copy in the mail If you have any lab test that is abnormal or we need to change your treatment, we will call you to review the results.   Follow-Up: At [facility], you and your health needs are our priority.  As part of our continuing mission to provide you with exceptional heart care, we have created designated Provider Care Teams.  These Care Teams include your primary Cardiologist (physician) and Advanced Practice Providers (APPs -  Physician Assistants and Nurse Practitioners) who all work together to provide you with the care you need, when you need it.  We recommend signing up for the patient portal called "MyChart".  Sign up information is provided on this After Visit Summary.  MyChart is used to connect with patients for Virtual Visits (Telemedicine).  Patients are able to view lab/test results, encounter notes, upcoming appointments, etc.  Non-urgent messages can be sent to your provider as well.   To learn more about what you can do with MyChart, go to [email or web address].    Your next appointment:   6 month(s)  The format for your next appointment:   In Person  Provider:   Michael "Andy" Tillery, PA-C{    

## 2020-12-05 ENCOUNTER — Telehealth: Payer: Self-pay

## 2020-12-05 ENCOUNTER — Ambulatory Visit: Payer: Federal, State, Local not specified - PPO

## 2020-12-05 DIAGNOSIS — Z95810 Presence of automatic (implantable) cardiac defibrillator: Secondary | ICD-10-CM

## 2020-12-05 DIAGNOSIS — I5022 Chronic systolic (congestive) heart failure: Secondary | ICD-10-CM

## 2020-12-05 NOTE — Progress Notes (Addendum)
EPIC Encounter for ICM Monitoring  Patient Name: Loretta Thomas is a 70 y.o. female Date: 12/05/2020 Primary Care Physican: Rossie Muskrat, DO Primary Cardiologist: [place] Electrophysiologist: Allred Bi-V Pacing:   >99%       11/30/2020 Office Weight: 191 lbs   AT/AF Burden: <1%                                                Attempted call to patient and unable to reach.  Left message to return call. Transmission reviewed.    Corvue thoracic impedance suggesting possible fluid accumulation starting 11/30/2020.   Prescribed:  Furosemide 40 mg take 1 tablet every other day. Spironolactone 25 mg take 0.5 tablet (12.5 mg total) by mouth daily Farxiga '10mg'$  take 1 tablet by mouth daily    Labs: 11/09/2020 Creatinine 0.77, BUN 11, Potassium 3.7, Sodium 138, GFR 84 A complete set of results can be found in Results Review.   Recommendations:  Unable to reach.  Will advise to take extra Furosemide x 2 days if patient returns call.   Follow-up plan: ICM clinic phone appointment on 12/13/2020 to recheck fluid levels.   91 day device clinic remote transmission 01/03/2021.     EP/Cardiology Office Visits:    12/28/2020 with Dr Haroldine Laws..   Requested DPR be mailed to patient.     Copy of ICM check sent to Dr. Rayann Heman.   3 month ICM trend: 12/05/2020.    1 Year ICM trend:       [name], RN 12/05/2020 4:25 PM

## 2020-12-05 NOTE — Telephone Encounter (Signed)
Remote ICM transmission received.  Attempted call to patient regarding ICM remote transmission and left message per DPR to return call.   

## 2020-12-07 ENCOUNTER — Other Ambulatory Visit: Payer: Self-pay

## 2020-12-07 MED ORDER — LETROZOLE 2.5 MG PO TABS: 2.5000 mg | ORAL_TABLET | Freq: Every day | ORAL | 0 refills | Status: DC

## 2020-12-13 ENCOUNTER — Ambulatory Visit (INDEPENDENT_AMBULATORY_CARE_PROVIDER_SITE_OTHER): Payer: Federal, State, Local not specified - PPO

## 2020-12-13 DIAGNOSIS — I5022 Chronic systolic (congestive) heart failure: Secondary | ICD-10-CM | POA: Diagnosis not present

## 2020-12-13 DIAGNOSIS — Z95810 Presence of automatic (implantable) cardiac defibrillator: Secondary | ICD-10-CM

## 2020-12-13 NOTE — Progress Notes (Signed)
EPIC Encounter for ICM Monitoring  Patient Name: Loretta Thomas is a 70 y.o. female Date: 12/13/2020 Primary Care Physican: Rossie Muskrat, DO Primary Cardiologist: [place] Electrophysiologist: Allred Bi-V Pacing:   >99%       11/30/2020 Office Weight: 191 lbs   AT/AF Burden: <1%                                                Transmission reviewed.    Corvue thoracic impedance suggesting fluid levels returned to normal.   Prescribed:  Furosemide 40 mg take 1 tablet every other day. Spironolactone 25 mg take 0.5 tablet (12.5 mg total) by mouth daily Farxiga '10mg'$  take 1 tablet by mouth daily    Labs: 11/09/2020 Creatinine 0.77, BUN 11, Potassium 3.7, Sodium 138, GFR 84 A complete set of results can be found in Results Review.   Recommendations:  No changes   Follow-up plan: ICM clinic phone appointment on 01/16/2021.   91 day device clinic remote transmission 01/03/2021.     EP/Cardiology Office Visits:    12/28/2020 with Dr Haroldine Laws..       Copy of ICM check sent to Dr. Rayann Heman.   3 month ICM trend: 12/13/2020.    1 Year ICM trend:       [name], RN 12/13/2020 2:51 PM

## 2020-12-16 ENCOUNTER — Encounter: Payer: Federal, State, Local not specified - PPO | Admitting: Internal Medicine

## 2020-12-28 ENCOUNTER — Ambulatory Visit (HOSPITAL_COMMUNITY)
Admission: RE | Admit: 2020-12-28 | Discharge: 2020-12-28 | Disposition: A | Discharge status: Home / Self Care | Payer: Federal, State, Local not specified - PPO | Source: Ambulatory Visit | Attending: Internal Medicine | Admitting: Internal Medicine

## 2020-12-28 ENCOUNTER — Encounter (HOSPITAL_COMMUNITY): Payer: Self-pay | Admitting: Internal Medicine

## 2020-12-28 ENCOUNTER — Other Ambulatory Visit: Payer: Self-pay

## 2020-12-28 VITALS — BP 124/80 | HR 92 | Wt 191.6 lb

## 2020-12-28 DIAGNOSIS — C50919 Malignant neoplasm of unspecified site of unspecified female breast: Secondary | ICD-10-CM | POA: Insufficient documentation

## 2020-12-28 DIAGNOSIS — I447 Left bundle-branch block, unspecified: Secondary | ICD-10-CM

## 2020-12-28 DIAGNOSIS — Z95810 Presence of automatic (implantable) cardiac defibrillator: Secondary | ICD-10-CM | POA: Diagnosis not present

## 2020-12-28 DIAGNOSIS — R0683 Snoring: Secondary | ICD-10-CM | POA: Insufficient documentation

## 2020-12-28 DIAGNOSIS — Z803 Family history of malignant neoplasm of breast: Secondary | ICD-10-CM | POA: Diagnosis not present

## 2020-12-28 DIAGNOSIS — C9110 Chronic lymphocytic leukemia of B-cell type not having achieved remission: Secondary | ICD-10-CM | POA: Insufficient documentation

## 2020-12-28 DIAGNOSIS — Z853 Personal history of malignant neoplasm of breast: Secondary | ICD-10-CM | POA: Insufficient documentation

## 2020-12-28 DIAGNOSIS — Z79811 Long term (current) use of aromatase inhibitors: Secondary | ICD-10-CM | POA: Diagnosis not present

## 2020-12-28 DIAGNOSIS — M791 Myalgia, unspecified site: Secondary | ICD-10-CM | POA: Diagnosis not present

## 2020-12-28 DIAGNOSIS — E785 Hyperlipidemia, unspecified: Secondary | ICD-10-CM | POA: Diagnosis not present

## 2020-12-28 DIAGNOSIS — Z9011 Acquired absence of right breast and nipple: Secondary | ICD-10-CM | POA: Insufficient documentation

## 2020-12-28 DIAGNOSIS — I5022 Chronic systolic (congestive) heart failure: Secondary | ICD-10-CM

## 2020-12-28 DIAGNOSIS — I2510 Atherosclerotic heart disease of native coronary artery without angina pectoris: Secondary | ICD-10-CM | POA: Diagnosis not present

## 2020-12-28 DIAGNOSIS — I110 Hypertensive heart disease with heart failure: Secondary | ICD-10-CM | POA: Insufficient documentation

## 2020-12-28 DIAGNOSIS — I10 Essential (primary) hypertension: Secondary | ICD-10-CM

## 2020-12-28 DIAGNOSIS — Z79899 Other long term (current) drug therapy: Secondary | ICD-10-CM | POA: Insufficient documentation

## 2020-12-28 DIAGNOSIS — Z7984 Long term (current) use of oral hypoglycemic drugs: Secondary | ICD-10-CM | POA: Insufficient documentation

## 2020-12-28 MED ORDER — CARVEDILOL 3.125 MG PO TABS: 3.1250 mg | ORAL_TABLET | Freq: Two times a day (BID) | ORAL | 2 refills | Status: DC

## 2020-12-28 MED ORDER — LOSARTAN POTASSIUM 100 MG PO TABS: 100.0000 mg | ORAL_TABLET | Freq: Every day | ORAL | 2 refills | Status: DC

## 2020-12-28 NOTE — Progress Notes (Signed)
ADVANCED HF CLINIC  NOTE    Primary Cardiologist: Jacinta Shoe  HPI: Loretta Thomas is a 70 y.o. female with severe HTN, anxiety, LBBB and systolic HF with EF 0000000. She has had multiple heart catheterizations over the past two decades: 2002 [facility]), 2011 [facility] 2013 [facility]). All showed normal or minimal CAD.  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 No chemoRx or XRT. 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  Developed LV dysfunction in 2019 (I cannot find echo from before them either in our system or at [facility]).    Echo 10/22/17 revealed EF 15%, mild LVH, global HK, moderate MR. Underwent BivICD in 9/19.   Repeat echo 1/21 EF 20-25% with normal RV and underwent AV optimization  Had PYP scan 07/13/19 Strongly suggestive of TTR amyloidosis or (Strongly suggestive: A semi-quantitative visual score of 2 or 3 or H/CL ratio >1.5) - 1.8. I saw her and felt like scan was negative for TTR.   She developed a rash with Entresto in 7/10  Echo 4/21 EF 25-30% Sleep study 4/21 AHI 7.1  Today she returns for HF follow up. Had [place] in July but now bouncing back. Says she had felt good x 20 days. But still quite fatigued. Can do ADLs but gets SOB if she tries to do much more. Edema improved. Taking lasix 40 qod. Had Mulitsense turned on for CRT. EF now up to 30-35%. Being screened for Barostim  ECHO 12/21 EF 25-30%.  Echo 11/09/2020 LVEF 30-35%, Grade 1 DD   Past Medical History:  Diagnosis Date   Anxiety    Bladder cystocele    Breast cancer ([place])    s/p L breast cancer removal   Bundle branch block, left    diagnosed 03/2012 followed by cath showing only minimal CAD   Bursitis    Cancer ([place])    right breast   Capsulitis    CHF (congestive heart failure) ([place])    Diverticulosis    Elevated liver enzymes 2007   History of IBS    Hyperlipidemia    Hypertension    Nonischemic  cardiomyopathy ([facility])    pernicious anemia     Current Outpatient Medications  Medication Sig Dispense Refill   acetaminophen (TYLENOL) 500 MG tablet Take 500 mg by mouth every 6 (six) hours as needed.      Calcium Citrate-Vitamin D (CITRACAL + D PO) Take 1,200 mg by mouth daily.     carvedilol (COREG) 3.125 MG tablet Take 1 tablet (3.125 mg total) by mouth 2 (two) times daily with a meal. 60 tablet 6   Coenzyme Q10 (CO Q10) 100 MG CAPS Take by mouth daily.     cyanocobalamin (,VITAMIN B-12,) 1000 MCG/ML injection Inject 1 mL (1,000 mcg total) into the muscle every 30 (thirty) days. 10 mL 0   dapagliflozin propanediol (FARXIGA) 10 MG TABS tablet TAKE 1 TABLET BY MOUTH DAILY BEFORE BREAKFAST. 30 tablet 11   fluticasone (FLONASE SENSIMIST) 27.5 MCG/SPRAY nasal spray 27.5 sprays as needed.     furosemide (LASIX) 40 MG tablet TAKE 1 TABLET BY MOUTH EVERY OTHER DAY 45 tablet 3   letrozole (FEMARA) 2.5 MG tablet Take 1 tablet (2.5 mg total) by mouth daily. 90 tablet 0   losartan (COZAAR) 50 MG tablet Take 1 tablet (50 mg total) by mouth daily. 90 tablet 1   rosuvastatin (CRESTOR) 5 MG tablet TAKE 1 TABLET (5 MG TOTAL) BY  MOUTH DAILY. 90 tablet 2   spironolactone (ALDACTONE) 25 MG tablet Take 0.5 tablets (12.5 mg total) by mouth daily. 90 tablet 3   venlafaxine XR (EFFEXOR-XR) 75 MG 24 hr capsule Take 1 capsule (75 mg total) by mouth daily with breakfast. 90 capsule 3   Vitamin D, Ergocalciferol, (DRISDOL) 50000 UNITS CAPS capsule Take 50,000 Units by mouth every 7 (seven) days. Fridays     No current facility-administered medications for this encounter.    Allergies  Allergen Reactions   Benzocaine Hives   Codeine     Stomach Pains   Demerol [Meperidine]     Upset stomach   Hydrocodone    Lortab [Hydrocodone-Acetaminophen]     Upset stomach   [place]       Social History   Socioeconomic History   Marital status: Married    Spouse name: Not on file   Number of children:  Not on file   Years of education: Not on file   Highest education level: Not on file  Occupational History   Not on file  Tobacco Use   Smoking status: Never   Smokeless tobacco: Never  Vaping Use   Vaping Use: Never used  Substance and Sexual Activity   Alcohol use: No   Drug use: No   Sexual activity: Yes  Other Topics Concern   Not on file  Social History Narrative   Lives in [place]   Social Determinants of Health   Financial Resource Strain: Not on file  Food Insecurity: Not on file  Transportation Needs: Not on file  Physical Activity: Not on file  Stress: Not on file  Social Connections: Not on file  Intimate Partner Violence: Not on file      Family History  Problem Relation Age of Onset   Cancer Sister        skin and colon x2   Cancer Brother        esophageal   Cancer Father 57       mouth   Cancer Maternal Aunt 55       breast   Breast cancer Maternal Aunt 55   Cancer Maternal Grandmother 13       ovarian   Cancer Cousin 28       mat 1st cousin with breast   Breast cancer Cousin 44   Cancer Paternal Grandfather 83       lung    Vitals:   12/28/20 1432  BP: 124/80  Pulse: 92  SpO2: 96%  Weight: 86.9 kg (191 lb 9.6 oz)   Wt Readings from Last 3 Encounters:  12/28/20 86.9 kg (191 lb 9.6 oz)  11/30/20 86.8 kg (191 lb 6.4 oz)  11/09/20 86.7 kg (191 lb 3.2 oz)     PHYSICAL EXAM: General:  Well appearing. No resp difficulty HEENT: normal Neck: supple. no JVD. Carotids 2+ bilat; no bruits. No lymphadenopathy or thryomegaly appreciated. Cor: PMI nondisplaced. Regular rate & rhythm. No rubs, gallops or murmurs. Lungs: clear Abdomen: obese soft, nontender, nondistended. No hepatosplenomegaly. No bruits or masses. Good bowel sounds. Extremities: no cyanosis, clubbing, rash, edema Neuro: alert & orientedx3, cranial nerves grossly intact. moves all 4 extremities w/o difficulty. Affect pleasant   ASSESSMENT & PLAN:  1. Chronic systolic HF   - due to presumed NICM - multiple cardiac caths with no CAD (last 2013) - EF 15% in 7/19 felt due to LBBB - s/p St Jude CRT-D in 9/19 - Echo 1/21 EF 25%. RV  ok - Echo 4/21 EF 25-30% (I thought 30-35%) - PYP 3/21 Grade 2 H/CLL 1.86. SPEP negative (I though it was negative) - ECHO 12/21 EF 25-30%.  - Echo 11/09/2020 LVEF 30-35%, Grade 1 DD - Stable to mildly improved NYHA III - Volume status ok  - Increase losartan 50 -> 10 daily  (rash with Entresto) - Continue carvedilol 3.125 mg twice a day. Intolerant increased due to fatigue.  - Continue spiro 25 mg daily. - Continue carvedilol 3.125 bid - Continue Farxiga 10 mg daily.  - Continue lasix '40mg'$  qod - ICD interrogated personally. No VT. AF burden < 1% AS-VP 99% Personally reviewed - Feel PYP scan is equivocal and based on echo findings (completely normal RV) I do not think she has cardiac amyloid or at least not severe enough to cause this degree of LV dysfunction. She has undergone AV optimization - Myeloma panel negative. Genetic testing negative  - Continue screening for Barostim  2. LBBB - s/p CRT-D - ICD interrogated today as above  3. Snoring - Very mild OSA on sleep study 4/21. AHI 7.1 Oxygen sats 83% - Dr. Radford Pax rfollowing  4. Breast Cancer - treated with surgery and letrazole (no chemo or XRT)  5. Hyperlipdemia  - continue statin. Myalgias improved with CoQ 10   Glori Bickers, MD  2:43 PM

## 2020-12-28 NOTE — Patient Instructions (Addendum)
INCREASE Losartan to '100mg'$  (1 tab) daily  Your physician recommends that you schedule a follow-up appointment in: 6 months with Dr Haroldine Laws.  Our office will call you closer to that time to schedule your appointment.   Please call office at [phone] option 2 if you have any questions or concerns.   At the [facility], you and your health needs are our priority. As part of our continuing mission to provide you with exceptional heart care, we have created designated Provider Care Teams. These Care Teams include your primary Cardiologist (physician) and Advanced Practice Providers (APPs- Physician Assistants and Nurse Practitioners) who all work together to provide you with the care you need, when you need it.   You may see any of the following providers on your designated Care Team at your next follow up: Dr Glori Bickers Dr Loralie Champagne Dr Patrice Paradise, NP Lyda Jester, [place] Ginnie Smart Audry Riles, PharmD   Please be sure to bring in all your medications bottles to every appointment.

## 2020-12-28 NOTE — Addendum Note (Signed)
Encounter addended by: Maple Mirza, RN on: 12/28/2020 3:01 PM  Actions taken: Order list changed, Pharmacy for encounter modified, Clinical Note Signed

## 2020-12-28 NOTE — Addendum Note (Signed)
Encounter addended by: Maple Mirza, RN on: 12/28/2020 3:03 PM  Actions taken: Order list changed, Pharmacy for encounter modified

## 2020-12-28 NOTE — Addendum Note (Signed)
Encounter addended by: Maple Mirza, RN on: 12/28/2020 2:56 PM  Actions taken: Clinical Note Signed

## 2021-01-02 ENCOUNTER — Ambulatory Visit (INDEPENDENT_AMBULATORY_CARE_PROVIDER_SITE_OTHER): Payer: Federal, State, Local not specified - PPO | Admitting: Surgery

## 2021-01-02 ENCOUNTER — Ambulatory Visit (HOSPITAL_COMMUNITY)
Admission: RE | Admit: 2021-01-02 | Discharge: 2021-01-02 | Disposition: A | Discharge status: Home / Self Care | Payer: Federal, State, Local not specified - PPO | Source: Ambulatory Visit | Attending: Surgery | Admitting: Surgery

## 2021-01-02 ENCOUNTER — Encounter: Payer: Self-pay | Admitting: Surgery

## 2021-01-02 ENCOUNTER — Other Ambulatory Visit: Payer: Self-pay

## 2021-01-02 VITALS — BP 130/85 | HR 82 | Temp 97.7°F | Resp 20 | Ht 67.0 in | Wt 192.0 lb

## 2021-01-02 DIAGNOSIS — Z01818 Encounter for other preprocedural examination: Secondary | ICD-10-CM | POA: Insufficient documentation

## 2021-01-02 DIAGNOSIS — I5020 Unspecified systolic (congestive) heart failure: Secondary | ICD-10-CM

## 2021-01-02 NOTE — Progress Notes (Signed)
Vascular and Vein Specialist of [facility]  Patient name: Loretta Thomas MRN: [id number] DOB: [birth year] Sex: female   REQUESTING PROVIDER:   Dr. Suezanne Jacquet tillery   REASON FOR CONSULT:    Barostim Evaluation  HISTORY OF PRESENT ILLNESS:   Loretta Thomas is a 70 y.o. female, who is referred for evaluation of a Barostim device.  The patient suffers from chronic systolic heart failure.  He has an ICD in place.  His most recent ejection fraction was 25-30%.  She is medically managed for hypertension.  Her heart failure symptoms have failed to improve despite guideline directed medical therapy.  Patient is medically managed for hypertension.  She is on a statin for hypercholesterolemia.  She is a non-smoker.  She does have a history of breast cancer and has had a right mastectomy with silicone implant.  She does get frequent urinary tract infections  PAST MEDICAL HISTORY    Past Medical History:  Diagnosis Date   Anxiety    Bladder cystocele    Breast cancer (HCC)    s/p L breast cancer removal   Bundle branch block, left    diagnosed 03/2012 followed by cath showing only minimal CAD   Bursitis    Cancer ([place])    right breast   Capsulitis    CHF (congestive heart failure) ([place])    Diverticulosis    Elevated liver enzymes 2007   History of IBS    Hyperlipidemia    Hypertension    Nonischemic cardiomyopathy (HCC)    pernicious anemia      FAMILY HISTORY   Family History  Problem Relation Age of Onset   Cancer Sister        skin and colon x2   Cancer Brother        esophageal   Cancer Father 12       mouth   Cancer Maternal Aunt 55       breast   Breast cancer Maternal Aunt 55   Cancer Maternal Grandmother 73       ovarian   Cancer Cousin 1       mat 1st cousin with breast   Breast cancer Cousin 48   Cancer Paternal Grandfather 83       lung    SOCIAL HISTORY:   Social History   Socioeconomic History   Marital  status: Married    Spouse name: Not on file   Number of children: Not on file   Years of education: Not on file   Highest education level: Not on file  Occupational History   Not on file  Tobacco Use   Smoking status: Never   Smokeless tobacco: Never  Vaping Use   Vaping Use: Never used  Substance and Sexual Activity   Alcohol use: No   Drug use: No   Sexual activity: Yes  Other Topics Concern   Not on file  Social History Narrative   Lives in [place] Determinants of Health   Financial Resource Strain: Not on file  Food Insecurity: Not on file  Transportation Needs: Not on file  Physical Activity: Not on file  Stress: Not on file  Social Connections: Not on file  Intimate Partner Violence: Not on file    ALLERGIES:    Allergies  Allergen Reactions   Benzocaine Hives   Codeine     Stomach Pains   Demerol [Meperidine]     Upset stomach   Hydrocodone    Lortab [Hydrocodone-Acetaminophen]  Upset stomach   Mangifera Indica     CURRENT MEDICATIONS:    Current Outpatient Medications  Medication Sig Dispense Refill   acetaminophen (TYLENOL) 500 MG tablet Take 500 mg by mouth every 6 (six) hours as needed.      Calcium Citrate-Vitamin D (CITRACAL + D PO) Take 1,200 mg by mouth daily.     carvedilol (COREG) 3.125 MG tablet Take 1 tablet (3.125 mg total) by mouth 2 (two) times daily with a meal. 180 tablet 2   Coenzyme Q10 (CO Q10) 100 MG CAPS Take by mouth daily.     cyanocobalamin (,VITAMIN B-12,) 1000 MCG/ML injection Inject 1 mL (1,000 mcg total) into the muscle every 30 (thirty) days. 10 mL 0   dapagliflozin propanediol (FARXIGA) 10 MG TABS tablet TAKE 1 TABLET BY MOUTH DAILY BEFORE BREAKFAST. 30 tablet 11   fluticasone (FLONASE SENSIMIST) 27.5 MCG/SPRAY nasal spray 27.5 sprays as needed.     furosemide (LASIX) 40 MG tablet TAKE 1 TABLET BY MOUTH EVERY OTHER DAY 45 tablet 3   letrozole (FEMARA) 2.5 MG tablet Take 1 tablet (2.5 mg total) by mouth  daily. 90 tablet 0   losartan (COZAAR) 100 MG tablet Take 1 tablet (100 mg total) by mouth daily. 90 tablet 2   rosuvastatin (CRESTOR) 5 MG tablet TAKE 1 TABLET (5 MG TOTAL) BY MOUTH DAILY. 90 tablet 2   spironolactone (ALDACTONE) 25 MG tablet Take 0.5 tablets (12.5 mg total) by mouth daily. 90 tablet 3   venlafaxine XR (EFFEXOR-XR) 75 MG 24 hr capsule Take 1 capsule (75 mg total) by mouth daily with breakfast. 90 capsule 3   Vitamin D, Ergocalciferol, (DRISDOL) 50000 UNITS CAPS capsule Take 50,000 Units by mouth every 7 (seven) days. Fridays     No current facility-administered medications for this visit.    REVIEW OF SYSTEMS:   '[X]'$  denotes positive finding, '[ ]'$  denotes negative finding Cardiac  Comments:  Chest pain or chest pressure:    Shortness of breath upon exertion:    Short of breath when lying flat:    Irregular heart rhythm:        Vascular    Pain in calf, thigh, or hip brought on by ambulation:    Pain in feet at night that wakes you up from your sleep:     Blood clot in your veins:    Leg swelling:         Pulmonary    Oxygen at home:    Productive cough:     Wheezing:         Neurologic    Sudden weakness in arms or legs:     Sudden numbness in arms or legs:     Sudden onset of difficulty speaking or slurred speech:    Temporary loss of vision in one eye:     Problems with dizziness:         Gastrointestinal    Blood in stool:      Vomited blood:         Genitourinary    Burning when urinating:     Blood in urine:        Psychiatric    Major depression:         Hematologic    Bleeding problems:    Problems with blood clotting too easily:        Skin    Rashes or ulcers:        Constitutional    Fever or chills:  PHYSICAL EXAM:   There were no vitals filed for this visit.  GENERAL: The patient is a well-nourished female, in no acute distress. The vital signs are documented above. CARDIAC: There is a regular rate and rhythm.  PULMONARY:  Nonlabored respirations MUSCULOSKELETAL: There are no major deformities or cyanosis. NEUROLOGIC: No focal weakness or paresthesias are detected. SKIN: There are no ulcers or rashes noted. PSYCHIATRIC: The patient has a normal affect.  STUDIES:   I have reviewed her vascular studies with the following findings: Right Carotid: The extracranial vessels were near-normal with only minimal  wall                 thickening or plaque.   Left Carotid: The extracranial vessels were near-normal with only minimal  wall                thickening or plaque.      ASSESSMENT and PLAN   NYHA class III heart failure: The patient has been referred for Barostim device implant.  I think she is a good candidate.  The only issue for her is her silicone breast implant on the right.  I think there is enough room to stay away from this however the device will need to be put in a little bit higher than normal.  She also has frequent urinary tract infections and so I will keep her on prophylactic antibiotics for about 1 week after surgery.  She also would like to stay in the hospital overnight.  We will work on Biochemist, clinical and get her scheduled once this is occurred.  All questions were answered today.   Leia Alf, MD, FACS [facility] and Vein Specialists of [facility] [phone] Pager [phone]

## 2021-01-03 ENCOUNTER — Ambulatory Visit (INDEPENDENT_AMBULATORY_CARE_PROVIDER_SITE_OTHER): Payer: Federal, State, Local not specified - PPO

## 2021-01-03 DIAGNOSIS — I428 Other cardiomyopathies: Secondary | ICD-10-CM

## 2021-01-03 LAB — CUP PACEART REMOTE DEVICE CHECK
Battery Remaining Longevity: 48 mo
Battery Remaining Percentage: 61 %
Battery Voltage: 2.95 V
Brady Statistic AP VP Percent: 1 %
Brady Statistic AP VS Percent: 0 %
Brady Statistic AS VP Percent: 99 %
Brady Statistic AS VS Percent: 1 %
Brady Statistic RA Percent Paced: 1 %
Date Time Interrogation Session: 20220920020026
HighPow Impedance: 81 Ohm
HighPow Impedance: 81 Ohm
Implantable Lead Implant Date: 20190920
Implantable Lead Implant Date: 20190920
Implantable Lead Implant Date: 20190920
Implantable Lead Location: 753858
Implantable Lead Location: 753859
Implantable Lead Location: 753860
Implantable Pulse Generator Implant Date: 20190920
Lead Channel Impedance Value: 1075 Ohm
Lead Channel Impedance Value: 410 Ohm
Lead Channel Impedance Value: 550 Ohm
Lead Channel Pacing Threshold Amplitude: 0.5 V
Lead Channel Pacing Threshold Amplitude: 0.75 V
Lead Channel Pacing Threshold Amplitude: 1 V
Lead Channel Pacing Threshold Pulse Width: 0.5 ms
Lead Channel Pacing Threshold Pulse Width: 0.5 ms
Lead Channel Pacing Threshold Pulse Width: 0.5 ms
Lead Channel Sensing Intrinsic Amplitude: 11.8 mV
Lead Channel Sensing Intrinsic Amplitude: 4.1 mV
Lead Channel Setting Pacing Amplitude: 2 V
Lead Channel Setting Pacing Amplitude: 2 V
Lead Channel Setting Pacing Amplitude: 2 V
Lead Channel Setting Pacing Pulse Width: 0.5 ms
Lead Channel Setting Pacing Pulse Width: 0.5 ms
Lead Channel Setting Sensing Sensitivity: 0.5 mV
Pulse Gen Serial Number: 9833194

## 2021-01-10 NOTE — Progress Notes (Signed)
Remote ICD transmission.   

## 2021-01-16 ENCOUNTER — Ambulatory Visit (INDEPENDENT_AMBULATORY_CARE_PROVIDER_SITE_OTHER): Payer: Federal, State, Local not specified - PPO

## 2021-01-16 DIAGNOSIS — Z95810 Presence of automatic (implantable) cardiac defibrillator: Secondary | ICD-10-CM | POA: Diagnosis not present

## 2021-01-16 DIAGNOSIS — I5022 Chronic systolic (congestive) heart failure: Secondary | ICD-10-CM | POA: Diagnosis not present

## 2021-01-18 NOTE — Progress Notes (Signed)
EPIC Encounter for ICM Monitoring  Patient Name: Loretta Thomas is a 70 y.o. female Date: 01/18/2021 Primary Care Physican: Rossie Muskrat, DO Primary Cardiologist: [place] Electrophysiologist: Allred Bi-V Pacing:   >99%       12/28/2020 Office Weight: 191 lbs   AT/AF Burden: <1%                                                Spoke with patient and heart failure questions reviewed.  Pt asymptomatic for fluid accumulation and feeling well.   Corvue thoracic impedance suggesting normal fluid levels.   Prescribed:  Furosemide 40 mg take 1 tablet every other day. Spironolactone 25 mg take 0.5 tablet (12.5 mg total) by mouth daily Farxiga 10mg  take 1 tablet by mouth daily    Labs: 11/09/2020 Creatinine 0.77, BUN 11, Potassium 3.7, Sodium 138, GFR 84 A complete set of results can be found in Results Review.   Recommendations:  No changes and encouraged to call if experiencing any fluid symptoms.   Follow-up plan: ICM clinic phone appointment on 02/20/2021.   91 day device clinic remote transmission 04/04/2021.     EP/Cardiology Office Visits:   Recall 06/26/2021 with Dr Haroldine Laws.  Recall 05/29/2021 with Oda Kilts, PA       Copy of ICM check sent to Dr. Rayann Heman.    3 month ICM trend: 01/16/2021.    1 Year ICM trend:       [name], RN 01/18/2021 1:53 PM

## 2021-02-14 ENCOUNTER — Telehealth: Payer: Self-pay

## 2021-02-14 NOTE — Telephone Encounter (Signed)
Pt called and asks for anxiolytic. Pt states her sister is sick and she is her caregiver, her friend is dying from cancer, and two other family members are sick, she states, "my nerves are shot". Pt asks for low dose. Advised pt I would ask MD and she would receive a CB 11/2.

## 2021-02-15 ENCOUNTER — Other Ambulatory Visit: Payer: Self-pay | Admitting: Hematology and Oncology

## 2021-02-15 MED ORDER — ALPRAZOLAM 0.25 MG PO TABS: 0.2500 mg | ORAL_TABLET | Freq: Two times a day (BID) | ORAL | 0 refills | Status: AC | PRN

## 2021-02-15 NOTE — Progress Notes (Signed)
Patient is feeling very anxious because of multiple family tragedies. I sent a prescription for Xanax 0.25 p.o. twice daily as needed

## 2021-02-20 ENCOUNTER — Telehealth: Payer: Self-pay

## 2021-02-20 NOTE — Telephone Encounter (Signed)
Spoke with patient.  She is unable to send remote transmission due to monitoring not working.  Shows in [employer] no communication with monitor for 21 days.  She will cal [employer] support to get assistance.

## 2021-02-24 NOTE — Telephone Encounter (Signed)
Remote transmission rescheduled for 03/06/2021.

## 2021-03-05 ENCOUNTER — Other Ambulatory Visit (HOSPITAL_COMMUNITY): Payer: Self-pay | Admitting: Internal Medicine

## 2021-03-07 ENCOUNTER — Telehealth: Payer: Self-pay

## 2021-03-07 NOTE — Telephone Encounter (Signed)
Spoke with patient.  She received a new monitor yesterday and will get is connected today.  She thinks she is retaining fluid because she has gained 3 lbs and stomach is bloated.  She will send remote transmission in the morning for review.

## 2021-03-08 ENCOUNTER — Ambulatory Visit (INDEPENDENT_AMBULATORY_CARE_PROVIDER_SITE_OTHER): Payer: Federal, State, Local not specified - PPO

## 2021-03-08 DIAGNOSIS — Z95810 Presence of automatic (implantable) cardiac defibrillator: Secondary | ICD-10-CM

## 2021-03-08 DIAGNOSIS — I5022 Chronic systolic (congestive) heart failure: Secondary | ICD-10-CM

## 2021-03-08 NOTE — Progress Notes (Signed)
EPIC Encounter for ICM Monitoring  Patient Name: Loretta Thomas is a 70 y.o. female Date: 03/08/2021 Primary Care Physican: Rossie Muskrat, DO Primary Cardiologist: [place] Electrophysiologist: Allred Bi-V Pacing:   >99%       03/08/2021 Office Weight: 193 lbs   AT/AF Burden: <1%                                                Spoke with patient and heart failure questions reviewed.  Pt reports good urine output yesterday and is feeling fine.    Corvue thoracic impedance suggesting normal fluid levels.    Prescribed:  Furosemide 40 mg take 1 tablet every other day. Spironolactone 25 mg take 0.5 tablet (12.5 mg total) by mouth daily Farxiga 10mg  take 1 tablet by mouth daily    Labs: 11/09/2020 Creatinine 0.77, BUN 11, Potassium 3.7, Sodium 138, GFR 84 A complete set of results can be found in Results Review.   Recommendations:  No changes and encouraged to call if experiencing any fluid symptoms.   Follow-up plan: ICM clinic phone appointment on 04/18/2021.   91 day device clinic remote transmission 04/04/2021.     EP/Cardiology Office Visits:   Recall 06/26/2021 with Dr Haroldine Laws.  Recall 05/29/2021 with Oda Kilts, PA       Copy of ICM check sent to Dr. Rayann Heman.    3 month ICM trend: 03/08/2021.    12-14 Month ICM trend:       [name], RN 03/08/2021 9:15 AM

## 2021-03-29 ENCOUNTER — Other Ambulatory Visit (HOSPITAL_COMMUNITY): Payer: Self-pay | Admitting: Internal Medicine

## 2021-03-31 ENCOUNTER — Other Ambulatory Visit: Payer: Self-pay

## 2021-03-31 MED ORDER — LETROZOLE 2.5 MG PO TABS: 2.5000 mg | ORAL_TABLET | Freq: Every day | ORAL | 1 refills | Status: DC

## 2021-04-04 ENCOUNTER — Ambulatory Visit (INDEPENDENT_AMBULATORY_CARE_PROVIDER_SITE_OTHER): Payer: Federal, State, Local not specified - PPO

## 2021-04-04 DIAGNOSIS — I428 Other cardiomyopathies: Secondary | ICD-10-CM

## 2021-04-04 LAB — CUP PACEART REMOTE DEVICE CHECK
Battery Remaining Longevity: 44 mo
Battery Remaining Percentage: 57 %
Battery Voltage: 2.95 V
Brady Statistic AP VP Percent: 1 %
Brady Statistic AP VS Percent: 0 %
Brady Statistic AS VP Percent: 99 %
Brady Statistic AS VS Percent: 1 %
Brady Statistic RA Percent Paced: 1 %
Date Time Interrogation Session: 20221220020015
HighPow Impedance: 84 Ohm
HighPow Impedance: 84 Ohm
Implantable Lead Implant Date: 20190920
Implantable Lead Implant Date: 20190920
Implantable Lead Implant Date: 20190920
Implantable Lead Location: 753858
Implantable Lead Location: 753859
Implantable Lead Location: 753860
Implantable Pulse Generator Implant Date: 20190920
Lead Channel Impedance Value: 430 Ohm
Lead Channel Impedance Value: 560 Ohm
Lead Channel Impedance Value: 990 Ohm
Lead Channel Pacing Threshold Amplitude: 0.5 V
Lead Channel Pacing Threshold Amplitude: 0.625 V
Lead Channel Pacing Threshold Amplitude: 1 V
Lead Channel Pacing Threshold Pulse Width: 0.5 ms
Lead Channel Pacing Threshold Pulse Width: 0.5 ms
Lead Channel Pacing Threshold Pulse Width: 0.5 ms
Lead Channel Sensing Intrinsic Amplitude: 11.8 mV
Lead Channel Sensing Intrinsic Amplitude: 4.9 mV
Lead Channel Setting Pacing Amplitude: 2 V
Lead Channel Setting Pacing Amplitude: 2 V
Lead Channel Setting Pacing Amplitude: 2 V
Lead Channel Setting Pacing Pulse Width: 0.5 ms
Lead Channel Setting Pacing Pulse Width: 0.5 ms
Lead Channel Setting Sensing Sensitivity: 0.5 mV
Pulse Gen Serial Number: 9833194

## 2021-04-11 ENCOUNTER — Telehealth (HOSPITAL_COMMUNITY): Payer: Self-pay | Admitting: *Deleted

## 2021-04-11 NOTE — Telephone Encounter (Signed)
Pt called to report she went to urgent care yesterday and was diagnosed with pna and bronchitis, she states they put her on doxycycline and prednisone. She just wanted [place] to be aware.  She also report having multiple UTI's recently, she thinks 3 over past year. She is going to look back and see how many, advised to let us know as we may need to stop [place].

## 2021-04-13 NOTE — Progress Notes (Signed)
Remote ICD transmission.   

## 2021-04-18 ENCOUNTER — Ambulatory Visit (INDEPENDENT_AMBULATORY_CARE_PROVIDER_SITE_OTHER): Payer: Federal, State, Local not specified - PPO

## 2021-04-18 DIAGNOSIS — I5022 Chronic systolic (congestive) heart failure: Secondary | ICD-10-CM

## 2021-04-18 DIAGNOSIS — Z95810 Presence of automatic (implantable) cardiac defibrillator: Secondary | ICD-10-CM | POA: Diagnosis not present

## 2021-04-19 ENCOUNTER — Telehealth: Payer: Self-pay

## 2021-04-19 NOTE — Telephone Encounter (Signed)
Remote ICM transmission received.  Attempted call to patient regarding ICM remote transmission and left message per DPR to return call.   

## 2021-04-19 NOTE — Progress Notes (Addendum)
Pt returned call.  She is feeling okay and currently traveling.  She self adjusts Furosemide as needed and will send updated report tomorrow morning to determine if she is still currently showing fluid.

## 2021-04-19 NOTE — Progress Notes (Signed)
EPIC Encounter for ICM Monitoring  Patient Name: Loretta Thomas is a 71 y.o. female Date: 04/19/2021 Primary Care Physican: Rossie Muskrat, DO Primary Cardiologist: [place] Electrophysiologist: Allred Bi-V Pacing:   >99%       03/08/2021 Office Weight: 193 lbs   AT/AF Burden: <1%                                                Attempted call to patient and unable to reach.  Left message to return call. Transmission reviewed.  Per 12/27 epic phone note, patient informed [facility] she was dx with pneumonia and bronchitis and being treated with prednisone and antibiotics.   Corvue thoracic impedance suggesting possible fluid accumulation starting 1/2.   Possible fluid accumulation also occurred 11/29-12/14.   Prescribed:  Furosemide 40 mg take 1 tablet every other day.   Spironolactone 25 mg take 0.5 tablet (12.5 mg total) by mouth daily Farxiga 10mg  take 1 tablet by mouth daily    Labs: 11/09/2020 Creatinine 0.77, BUN 11, Potassium 3.7, Sodium 138, GFR 84 A complete set of results can be found in Results Review.   Recommendations:  Unable to reach.     Follow-up plan: ICM clinic phone appointment on 04/27/2021 to recheck fluid levels.   91 day device clinic remote transmission 07/04/2021.     EP/Cardiology Office Visits:   06/16/2021 with Dr Haroldine Laws.  Recall 05/29/2021 with Oda Kilts, PA       Copy of ICM check sent to Dr. Rayann Heman.  Will send to Dr Haroldine Laws for review if patient is reached.   3 month ICM trend: 04/18/2021.    12-14 Month ICM trend:     [name], RN 04/19/2021 1:10 PM

## 2021-04-21 NOTE — Progress Notes (Signed)
Spoke with patient and she is still feeling weak from pneumonia/bronchitis but has been on antibiotics x 5 days and prednisone.  Pt sent updated remote transmission and fluid levels returned to normal after taking extra Furosemide.   Next ICM remote transmission due 05/22/2021.

## 2021-05-03 ENCOUNTER — Other Ambulatory Visit: Payer: Self-pay

## 2021-05-03 MED ORDER — LOSARTAN POTASSIUM 100 MG PO TABS: 100.0000 mg | ORAL_TABLET | Freq: Every day | ORAL | 3 refills | Status: DC

## 2021-05-12 ENCOUNTER — Other Ambulatory Visit: Payer: Self-pay | Admitting: Hematology and Oncology

## 2021-05-12 DIAGNOSIS — Z853 Personal history of malignant neoplasm of breast: Secondary | ICD-10-CM

## 2021-05-22 ENCOUNTER — Ambulatory Visit (INDEPENDENT_AMBULATORY_CARE_PROVIDER_SITE_OTHER): Payer: Federal, State, Local not specified - PPO

## 2021-05-22 ENCOUNTER — Telehealth: Payer: Self-pay

## 2021-05-22 DIAGNOSIS — Z95810 Presence of automatic (implantable) cardiac defibrillator: Secondary | ICD-10-CM

## 2021-05-22 DIAGNOSIS — I5022 Chronic systolic (congestive) heart failure: Secondary | ICD-10-CM

## 2021-05-22 NOTE — Progress Notes (Signed)
Spoke with patient and heart failure questions reviewed.  Pt asymptomatic for fluid accumulation.  Reports she currently is taking medication for yeast infection after taking anitbiotics for dental appointment.  Advised to increase fluid intake to 64-74 oz daily x 2 days and send manual remote transmission on 2/10 to recheck fluid levels.

## 2021-05-22 NOTE — Telephone Encounter (Signed)
Remote ICM transmission received.  Attempted call to patient regarding ICM remote transmission and left message per DPR to return call.   

## 2021-05-22 NOTE — Progress Notes (Signed)
EPIC Encounter for ICM Monitoring  Patient Name: Loretta Thomas is a 71 y.o. female Date: 05/22/2021 Primary Care Physican: Rossie Muskrat, DO Primary Cardiologist: [place] Electrophysiologist: Allred Bi-V Pacing:   >99%       03/08/2021 Office Weight: 193 lbs   AT/AF Burden: <1%                                                Attempted call to patient and unable to reach.  Left message to return call. Transmission reviewed.    Corvue thoracic impedance suggesting possible dryness starting 2/4.  Impedance suggesting possible fluid accumulation from 1/13-1/22, 1/29-1/31 and 2/1-2/4.   Prescribed:  Furosemide 40 mg take 1 tablet every other day. Spironolactone 25 mg take 0.5 tablet (12.5 mg total) by mouth daily Farxiga 10mg  take 1 tablet by mouth daily    Labs: 11/09/2020 Creatinine 0.77, BUN 11, Potassium 3.7, Sodium 138, GFR 84 A complete set of results can be found in Results Review.   Recommendations:  Unable to reach.     Follow-up plan: ICM clinic phone appointment on 05/26/2021 (manual) to recheck fluid levels.   91 day device clinic remote transmission 04/04/2021.     EP/Cardiology Office Visits:   06/16/2021 with Dr Haroldine Laws.  Recall 05/29/2021 with Oda Kilts, PA       Copy of ICM check sent to Dr. Rayann Heman.  Will send to Dr Jeffie Pollock for review if patient is reached.  3 month ICM trend: 05/22/2021.    12-14 Month ICM trend:     [name], RN 05/22/2021 2:17 PM

## 2021-05-26 ENCOUNTER — Ambulatory Visit (INDEPENDENT_AMBULATORY_CARE_PROVIDER_SITE_OTHER): Payer: Federal, State, Local not specified - PPO

## 2021-05-26 DIAGNOSIS — I5022 Chronic systolic (congestive) heart failure: Secondary | ICD-10-CM

## 2021-05-26 DIAGNOSIS — Z95810 Presence of automatic (implantable) cardiac defibrillator: Secondary | ICD-10-CM

## 2021-05-26 NOTE — Progress Notes (Signed)
EPIC Encounter for ICM Monitoring  Patient Name: Loretta Thomas is a 71 y.o. female Date: 05/26/2021 Primary Care Physican: Rossie Muskrat, DO Primary Care Physican: Rossie Muskrat, DO Primary Cardiologist: [place] Electrophysiologist: Allred Bi-V Pacing:   >99%       03/08/2021 Office Weight: 193 lbs   AT/AF Burden: <1%                                                Transmission reviewed.    Corvue thoracic impedance suggesting fluid levels returned to normal after episode of possible dryness.   Prescribed:  Furosemide 40 mg take 1 tablet every other day. Spironolactone 25 mg take 0.5 tablet (12.5 mg total) by mouth daily Farxiga 10mg  take 1 tablet by mouth daily    Labs: 11/09/2020 Creatinine 0.77, BUN 11, Potassium 3.7, Sodium 138, GFR 84 A complete set of results can be found in Results Review.   Recommendations:  No changes     Follow-up plan: ICM clinic phone appointment on 06/26/2021.   91 day device clinic remote transmission 07/04/2021.     EP/Cardiology Office Visits:   06/16/2021 with Dr Haroldine Laws.  Recall 05/29/2021 with Oda Kilts, PA       Copy of ICM check sent to Dr. Rayann Heman.  3 month ICM trend: 05/26/2021.     [name], RN 05/26/2021 3:27 PM

## 2021-06-14 ENCOUNTER — Other Ambulatory Visit: Payer: Self-pay

## 2021-06-14 ENCOUNTER — Ambulatory Visit
Admission: RE | Admit: 2021-06-14 | Discharge: 2021-06-14 | Disposition: A | Discharge status: Home / Self Care | Payer: Federal, State, Local not specified - PPO | Source: Ambulatory Visit | Attending: Hematology and Oncology | Admitting: Hematology and Oncology

## 2021-06-14 ENCOUNTER — Other Ambulatory Visit: Payer: Self-pay | Admitting: Hematology and Oncology

## 2021-06-14 DIAGNOSIS — Z853 Personal history of malignant neoplasm of breast: Secondary | ICD-10-CM

## 2021-06-15 NOTE — Progress Notes (Signed)
? ?ADVANCED HF CLINIC  NOTE ? ? ? ?Primary Cardiologist: Jacinta Shoe ? ?HPI: ?Loretta Thomas is a 71 y.o. female with severe HTN, anxiety, LBBB and systolic HF with EF 65-78%. She has had multiple heart catheterizations 2002 [facility]), 2011 [facility]), 2013 [facility]). All showed normal or minimal CAD. ? 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 No chemoRx or XRT. 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 ? ?Developed LV dysfunction in 2019 (I cannot find echo from before them either in our system or at [facility]).  ?  ?Echo 7/19 revealed EF 15%, mild LVH, global HK, moderate MR. Underwent BivICD in 9/19.  ? ?Echo 1/21 EF 20-25% with normal RV and underwent AV optimization ? ?Had PYP scan 07/13/19 read as strongly suggestive of TTR amyloidosis  I saw  -> her and felt like scan was negative for TTR.  ? ?She developed a rash with Entresto ? ?Echo 4/21 EF 25-30% ?Sleep study 4/21 AHI 7.1 ?Echo 12/21 EF 25-30%.  ?Echo 11/09/2020 LVEF 30-35%, Grade 1 DD ? ?Today she returns for HF follow up. Says she has been under a lot of stress with her sisters in the hospital. She had PNA in 12/22. Remains fatigued. Says breathing better but she gets fatigued easily.  ? ? ?Past Medical History:  ?Diagnosis Date  ? Anxiety   ? Bladder cystocele   ? Breast cancer ([place])   ? s/p L breast cancer removal  ? Bundle branch block, left   ? diagnosed 03/2012 followed by cath showing only minimal CAD  ? Bursitis   ? Cancer [facility])   ? right breast  ? Capsulitis   ? CHF (congestive heart failure) ([place])   ? Diverticulosis   ? Elevated liver enzymes 2007  ? History of IBS   ? Hyperlipidemia   ? Hypertension   ? Nonischemic cardiomyopathy ([place])   ? pernicious anemia   ? ? ?Current Outpatient Medications  ?Medication Sig Dispense Refill  ? acetaminophen (TYLENOL) 500 MG tablet Take 500 mg by mouth every 6 (six) hours as needed.     ? ALPRAZolam (XANAX) 0.25 MG  tablet Take 1 tablet (0.25 mg total) by mouth 2 (two) times daily as needed for anxiety. 30 tablet 0  ? Biotin 5000 MCG CAPS Take 1 capsule by mouth daily in the afternoon.    ? Calcium Citrate-Vitamin D (CALCIUM CITRATE +D PO) Take 1 tablet by mouth every other day.    ? Calcium Citrate-Vitamin D (CITRACAL + D PO) Take 1,200 mg by mouth daily.    ? carvedilol (COREG) 3.125 MG tablet Take 1 tablet (3.125 mg total) by mouth 2 (two) times daily with a meal. 180 tablet 2  ? Coenzyme Q10 (CO Q10) 100 MG CAPS Take by mouth daily.    ? CRANBERRY CONCENTRATE PO Take 1 tablet by mouth daily.    ? cyanocobalamin (,VITAMIN B-12,) 1000 MCG/ML injection Inject 1 mL (1,000 mcg total) into the muscle every 30 (thirty) days. 10 mL 0  ? fluticasone (FLONASE SENSIMIST) 27.5 MCG/SPRAY nasal spray 27.5 sprays as needed.    ? furosemide (LASIX) 40 MG tablet TAKE 1 TABLET BY MOUTH EVERY OTHER DAY 45 tablet 3  ? letrozole (FEMARA) 2.5 MG tablet Take 1 tablet (2.5 mg total) by mouth daily. 90 tablet 1  ? losartan (COZAAR) 100 MG tablet Take 1 tablet (100 mg total) by mouth daily. 90 tablet 2  ? Multiple  Vitamin (MULTIVITAMIN WITH MINERALS) TABS tablet Take 1 tablet by mouth daily.    ? rosuvastatin (CRESTOR) 5 MG tablet TAKE 1 TABLET (5 MG TOTAL) BY MOUTH DAILY. 90 tablet 2  ? spironolactone (ALDACTONE) 25 MG tablet TAKE 1 TABLET BY MOUTH EVERY DAY 90 tablet 3  ? venlafaxine XR (EFFEXOR-XR) 75 MG 24 hr capsule Take 1 capsule (75 mg total) by mouth daily with breakfast. 90 capsule 3  ? Vitamin D, Ergocalciferol, (DRISDOL) 50000 UNITS CAPS capsule Take 50,000 Units by mouth every 7 (seven) days. Fridays    ? dapagliflozin propanediol (FARXIGA) 10 MG TABS tablet TAKE 1 TABLET BY MOUTH DAILY BEFORE BREAKFAST. (Patient not taking: Reported on 06/16/2021) 30 tablet 11  ? ?No current facility-administered medications for this encounter.  ? ? ?Allergies  ?Allergen Reactions  ? Benzocaine Hives  ? Codeine   ?  Stomach Pains  ? Demerol [Meperidine]    ?  Upset stomach  ? Hydrocodone   ? Lortab [Hydrocodone-Acetaminophen]   ?  Upset stomach  ? Mangifera Indica   ? ? ?  ?Social History  ? ?Socioeconomic History  ? Marital status: Married  ?  Spouse name: Not on file  ? Number of children: Not on file  ? Years of education: Not on file  ? Highest education level: Not on file  ?Occupational History  ? Not on file  ?Tobacco Use  ? Smoking status: Never  ? Smokeless tobacco: Never  ?Vaping Use  ? Vaping Use: Never used  ?Substance and Sexual Activity  ? Alcohol use: No  ? Drug use: No  ? Sexual activity: Yes  ?Other Topics Concern  ? Not on file  ?Social History Narrative  ? Lives in [place]  ? ?Social Determinants of Health  ? ?Financial Resource Strain: Not on file  ?Food Insecurity: Not on file  ?Transportation Needs: Not on file  ?Physical Activity: Not on file  ?Stress: Not on file  ?Social Connections: Not on file  ?Intimate Partner Violence: Not on file  ? ? ?  ?Family History  ?Problem Relation Age of Onset  ? Cancer Sister   ?     skin and colon x2  ? Cancer Brother   ?     esophageal  ? Cancer Father 37  ?     mouth  ? Cancer Maternal Aunt 55  ?     breast  ? Breast cancer Maternal Aunt 45  ? Cancer Maternal Grandmother 38  ?     ovarian  ? Cancer Cousin 15  ?     mat 1st cousin with breast  ? Breast cancer Cousin 29  ? Cancer Paternal Grandfather 73  ?     lung  ? ? ?Vitals:  ? 06/16/21 1123  ?BP: 110/70  ?Pulse: 84  ?SpO2: 95%  ?Weight: 87.6 kg (193 lb 3.2 oz)  ? ? ?Wt Readings from Last 3 Encounters:  ?06/16/21 87.6 kg (193 lb 3.2 oz)  ?01/02/21 87.1 kg (192 lb)  ?12/28/20 86.9 kg (191 lb 9.6 oz)  ? ? ? ?PHYSICAL EXAM: ?General:  Well appearing. No resp difficulty ?HEENT: normal ?Neck: supple. no JVD. Carotids 2+ bilat; no bruits. No lymphadenopathy or thryomegaly appreciated. ?Cor: PMI nondisplaced. Regular rate & rhythm. No rubs, gallops or murmurs. ?Lungs: clear ?Abdomen: obese soft, nontender, nondistended. No hepatosplenomegaly. No bruits or  masses. Good bowel sounds. ?Extremities: no cyanosis, clubbing, rash, edema ?Neuro: alert & orientedx3, cranial nerves grossly intact. moves all 4 extremities  w/o difficulty. Affect pleasant ? ?ASSESSMENT & PLAN: ? ?1. Chronic systolic HF  ?- due to presumed NICM ?- multiple cardiac caths with no CAD (last 2013) ?- EF 15% in 7/19 felt due to LBBB ?- s/p St Jude CRT-D in 9/19 ?- Echo 1/21 EF 25%. RV ok ?- Echo 4/21 EF 25-30% (I thought 30-35%) ?- PYP 3/21 Grade 2 H/CLL 1.86. SPEP negative (I though it was negative) ?- ECHO 12/21 EF 25-30%.  ?- Echo 11/09/2020 LVEF 30-35%, Grade 1 DD ?- Stable NYHA III - mostly due to fatigue ?- Volume status ok ?- Continue losartan 100 daily   (rash with Entresto) ?- Continue carvedilol 3.125 mg twice a day. Intolerant increased due to fatigue.  ?- Continue spiro 12.5 mg daily. ?- Continue carvedilol 3.125 bid ?- Continue Farxiga 10 mg daily.  ?- Continue lasix 40mg  qod ?- ICD interrogated personally. No VT/AF. BIvpacing 99% Volume ok Personally reviewed ?- Feel PYP scan is equivocal and based on echo findings (completely normal RV) I do not think she has cardiac amyloid or at least not severe enough to cause this degree of LV dysfunction. She has undergone AV optimization ?- Myeloma panel negative. Genetic testing negative  ?- Continue screening for Barostim. She is considering this still  ?- Needs repeat echo and labs ? ?2. LBBB ?- s/p CRT-D ?- ICD interrogated today as above ? ?3. Snoring ?- Very mild OSA on sleep study 4/21. AHI 7.1 Oxygen sats 83% ?- Dr. Radford Pax following ? ?4. Breast Cancer ?- treated with surgery and letrazole (no chemo or XRT) ? ?5. Hyperlipdemia  ?- continue statin. Myalgias improved with CoQ 10 ?- no change today ? ? ?Glori Bickers, MD  ?12:14 PM ? ? ? ? ?

## 2021-06-16 ENCOUNTER — Other Ambulatory Visit: Payer: Self-pay

## 2021-06-16 ENCOUNTER — Ambulatory Visit (HOSPITAL_COMMUNITY)
Admission: RE | Admit: 2021-06-16 | Discharge: 2021-06-16 | Disposition: A | Discharge status: Home / Self Care | Payer: Federal, State, Local not specified - PPO | Source: Ambulatory Visit | Attending: Internal Medicine | Admitting: Internal Medicine

## 2021-06-16 ENCOUNTER — Encounter (HOSPITAL_COMMUNITY): Payer: Self-pay | Admitting: Internal Medicine

## 2021-06-16 VITALS — BP 110/70 | HR 84 | Wt 193.2 lb

## 2021-06-16 DIAGNOSIS — I5022 Chronic systolic (congestive) heart failure: Secondary | ICD-10-CM | POA: Diagnosis not present

## 2021-06-16 DIAGNOSIS — I110 Hypertensive heart disease with heart failure: Secondary | ICD-10-CM | POA: Insufficient documentation

## 2021-06-16 DIAGNOSIS — Z95810 Presence of automatic (implantable) cardiac defibrillator: Secondary | ICD-10-CM

## 2021-06-16 DIAGNOSIS — Z853 Personal history of malignant neoplasm of breast: Secondary | ICD-10-CM | POA: Insufficient documentation

## 2021-06-16 DIAGNOSIS — Z79899 Other long term (current) drug therapy: Secondary | ICD-10-CM | POA: Diagnosis not present

## 2021-06-16 DIAGNOSIS — R0683 Snoring: Secondary | ICD-10-CM | POA: Diagnosis not present

## 2021-06-16 DIAGNOSIS — I447 Left bundle-branch block, unspecified: Secondary | ICD-10-CM | POA: Diagnosis not present

## 2021-06-16 DIAGNOSIS — I10 Essential (primary) hypertension: Secondary | ICD-10-CM

## 2021-06-16 DIAGNOSIS — C50919 Malignant neoplasm of unspecified site of unspecified female breast: Secondary | ICD-10-CM | POA: Diagnosis not present

## 2021-06-16 DIAGNOSIS — Z9011 Acquired absence of right breast and nipple: Secondary | ICD-10-CM | POA: Insufficient documentation

## 2021-06-16 DIAGNOSIS — E785 Hyperlipidemia, unspecified: Secondary | ICD-10-CM | POA: Diagnosis not present

## 2021-06-16 DIAGNOSIS — F419 Anxiety disorder, unspecified: Secondary | ICD-10-CM | POA: Insufficient documentation

## 2021-06-16 DIAGNOSIS — G4733 Obstructive sleep apnea (adult) (pediatric): Secondary | ICD-10-CM

## 2021-06-16 DIAGNOSIS — I519 Heart disease, unspecified: Secondary | ICD-10-CM

## 2021-06-16 DIAGNOSIS — Z79811 Long term (current) use of aromatase inhibitors: Secondary | ICD-10-CM | POA: Diagnosis not present

## 2021-06-16 MED ORDER — DAPAGLIFLOZIN PROPANEDIOL 10 MG PO TABS: 10.0000 mg | ORAL_TABLET | Freq: Every day | ORAL | 11 refills | Status: DC

## 2021-06-16 NOTE — Patient Instructions (Signed)
Thank you for your visit today. ? ?There has been no changes to your medications. ? ?Your physician has requested that you have an echocardiogram. Echocardiography is a painless test that uses sound waves to create images of your heart. It provides your doctor with information about the size and shape of your heart and how well your heart?s chambers and valves are working. This procedure takes approximately one hour. There are no restrictions for this procedure. ? ?Your physician recommends that you schedule a follow-up appointment in: 6 months with an echocardiogram (September 2023)  ** you are advised to call the office in July to make sure you can get a follow up appointment** ? ?If you have any questions or concerns before your next appointment please send [place] a message through [place] or call our office at [phone].   ? ?TO LEAVE A MESSAGE FOR THE NURSE SELECT OPTION 2, PLEASE LEAVE A MESSAGE INCLUDING: ?YOUR NAME ?DATE OF BIRTH ?CALL BACK NUMBER ?REASON FOR CALL**this is important as we prioritize the call backs ? ?YOU WILL RECEIVE A CALL BACK THE SAME DAY AS LONG AS YOU CALL BEFORE 4:00 PM ? ?At the [facility], you and your health needs are our priority. As part of our continuing mission to provide you with exceptional heart care, we have created designated Provider Care Teams. These Care Teams include your primary Cardiologist (physician) and Advanced Practice Providers (APPs- Physician Assistants and Nurse Practitioners) who all work together to provide you with the care you need, when you need it.  ? ?You may see any of the following providers on your designated Care Team at your next follow up: ?Dr Glori Bickers ?Dr Loralie Champagne ?Darrick Grinder, NP ?Lyda Jester, PA ?Jessica Milford,NP ?Marlyce Huge, PA ?Audry Riles, PharmD ? ? ?Please be sure to bring in all your medications bottles to every appointment.  ? ? ?

## 2021-06-19 ENCOUNTER — Other Ambulatory Visit: Payer: Self-pay | Admitting: *Deleted

## 2021-06-19 MED ORDER — ROSUVASTATIN CALCIUM 5 MG PO TABS: 5.0000 mg | ORAL_TABLET | Freq: Every day | ORAL | 0 refills | Status: DC

## 2021-06-20 ENCOUNTER — Telehealth: Payer: Self-pay

## 2021-06-20 ENCOUNTER — Other Ambulatory Visit: Payer: Self-pay

## 2021-06-20 DIAGNOSIS — Z170 Estrogen receptor positive status [ER+]: Secondary | ICD-10-CM

## 2021-06-20 DIAGNOSIS — C50411 Malignant neoplasm of upper-outer quadrant of right female breast: Secondary | ICD-10-CM

## 2021-06-20 NOTE — Progress Notes (Signed)
? ?Patient Care Team: ?Rossie Muskrat, DO as PCP - General (Family Medicine) ?Thompson Grayer, MD as PCP - Electrophysiology (Cardiology) ?Sueanne Margarita, MD as PCP - Sleep Medicine (Cardiology) ?Bensimhon, Shaune Pascal, MD as PCP - Advanced Heart Failure (Cardiology) ?Rossie Muskrat, DO as Referring Physician (Family Medicine) ? ?DIAGNOSIS:  ?  ICD-10-CM   ?1. Malignant neoplasm of upper-outer quadrant of right breast in female, estrogen receptor positive ([place])  C50.411   ? Z17.0   ?  ? ? ?SUMMARY OF ONCOLOGIC HISTORY: ?Oncology History  ?Breast cancer of upper-outer quadrant of right female breast ([place])  ?10/19/2013 Mammogram  ? outer right breast, middle third, demonstrate a 6 x 6 x 12 mm group of heterogeneous calcifications ?  ?11/10/2013 Breast MRI  ? nonmacerated enhancement in the retroareolar region extending to the nipple with the area of enhancement measuring 4.3 x 2.5 x 3.0 cm ?  ?11/12/2013 Initial Diagnosis  ? Right mastectomy: Invasive ductal carcinoma with DCIS with comedo-type necrosis and calcifications in ER 99% PR 30% Ki-67 40% HER-2 negative ratio 1.14; Grade 3; second biopsy from subareolar area done 11/16/2013 revealed DCIS with calcifications ?  ?12/29/2013 Procedure  ? 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  ?03/09/2014 Procedure  ? Oncotype DX recurrence score 24, 16% risk of recurrence intermediate risk, patient offered chemotherapy ?  ?03/22/2014 -  Anti-estrogen oral therapy  ? Arimidex 1 mg daily switched to letrozole 07/26/2016 due to muscle aches and pains ?  ? ? ?CHIEF COMPLIANT: Surveillance of breast cancer ? ?INTERVAL HISTORY: Loretta Thomas is a 71 y.o. with above-mentioned history of right breast cancer treated with mastectomy and who is currently on antiestrogen therapy with letrozole. Mammogram on 06/14/2021 showed  no evidence of malignancy. She presents to the clinic today for follow-up.  Her major complaint is related to fatigue. ? ?ALLERGIES:  is allergic to benzocaine, codeine, demerol [meperidine], hydrocodone, lortab [hydrocodone-acetaminophen], and mangifera indica. ? ?MEDICATIONS:  ?Current Outpatient Medications  ?Medication Sig Dispense Refill  ? acetaminophen (TYLENOL) 500 MG tablet Take 500 mg by mouth every 6 (six) hours as needed.     ? ALPRAZolam (XANAX) 0.25 MG tablet Take 1 tablet (0.25 mg total) by mouth 2 (two) times daily as needed for anxiety. 30 tablet 0  ? Biotin 5000 MCG CAPS Take 1 capsule by mouth daily in the afternoon.    ? Calcium Citrate-Vitamin D (CALCIUM CITRATE +D PO) Take 1 tablet by mouth every other day.    ? Calcium Citrate-Vitamin D (CITRACAL + D PO) Take 1,200 mg by mouth daily.    ? carvedilol (COREG) 3.125 MG tablet Take 1 tablet (3.125 mg total) by mouth 2 (two) times daily with a meal. 180 tablet 2  ? Coenzyme Q10 (CO Q10) 100 MG CAPS Take by mouth daily.    ? CRANBERRY CONCENTRATE PO Take 1 tablet by mouth daily.    ? cyanocobalamin (,VITAMIN B-12,) 1000 MCG/ML injection Inject 1 mL (1,000 mcg total) into the muscle every 30 (thirty) days. 10 mL 0  ? dapagliflozin propanediol (FARXIGA) 10 MG TABS tablet Take 1 tablet (10 mg total) by mouth daily before breakfast. 30 tablet 11  ? fluticasone (FLONASE SENSIMIST) 27.5 MCG/SPRAY nasal spray 27.5 sprays as needed.    ? furosemide (LASIX) 40 MG tablet TAKE 1 TABLET BY MOUTH EVERY OTHER DAY 45 tablet 3  ?  letrozole (FEMARA) 2.5 MG tablet Take 1 tablet (2.5 mg total) by mouth daily. 90 tablet 1  ? losartan (COZAAR) 100 MG tablet Take 1 tablet (100 mg total) by mouth daily. 90 tablet 2  ? Multiple Vitamin (MULTIVITAMIN WITH MINERALS) TABS tablet Take 1 tablet by mouth daily.    ? rosuvastatin (CRESTOR) 5 MG tablet Take 1 tablet (5 mg total) by mouth daily. 90 tablet 0  ? spironolactone (ALDACTONE) 25 MG tablet TAKE 1 TABLET BY MOUTH EVERY DAY  90 tablet 3  ? venlafaxine XR (EFFEXOR-XR) 75 MG 24 hr capsule Take 1 capsule (75 mg total) by mouth daily with breakfast. 90 capsule 3  ? Vitamin D, Ergocalciferol, (DRISDOL) 50000 UNITS CAPS capsule Take 50,000 Units by mouth every 7 (seven) days. Fridays    ? ?No current facility-administered medications for this visit.  ? ? ?PHYSICAL EXAMINATION: ?ECOG PERFORMANCE STATUS: 1 - Symptomatic but completely ambulatory ? ?Vitals:  ? 06/21/21 1028  ?BP: 133/76  ?Pulse: 76  ?Resp: 16  ?Temp: 98.1 ?F (36.7 ?C)  ?SpO2: 98%  ? ?Filed Weights  ? 06/21/21 1028  ?Weight: 191 lb 9.6 oz (86.9 kg)  ? ?  ? ?LABORATORY DATA:  ?I have reviewed the data as listed ?CMP Latest Ref Rng & Units 11/09/2020 09/30/2019 07/21/2019  ?Glucose 65 - 99 mg/dL 124(H) 107(H) 106(H)  ?BUN 8 - 27 mg/dL _0 ?Creatinine 0.57 - 1.00 mg/dL 0.77 0.83 0.77  ?Sodium 134 - 144 mmol/L 138 139 140  ?Potassium 3.5 - 5.2 mmol/L 3.7 4.1 4.3  ?Chloride 96 - 106 mmol/L 97 103 103  ?CO2 20 - 29 mmol/L _1 ?Calcium 8.7 - 10.3 mg/dL 9.8 9.4 9.3  ?Total Protein 6.0 - 8.5 g/dL - - -  ?Total Bilirubin 0.20 - 1.20 mg/dL - - -  ?Alkaline Phos 40 - 150 U/L - - -  ?AST 5 - 34 U/L - - -  ?ALT 0 - 55 U/L - - -  ? ? ?Lab Results  ?Component Value Date  ? WBC 6.5 11/09/2020  ? HGB 13.1 11/09/2020  ? HCT 38.4 11/09/2020  ? MCV 90 11/09/2020  ? PLT 189 11/09/2020  ? NEUTROABS 4.2 12/30/2017  ? ? ?ASSESSMENT & PLAN:  ?Breast cancer of upper-outer quadrant of right female breast ([place]) ?Right breast IDC with high-grade DCIS with comedonecrosis ER/PR positive HER-2 negative Ki-67 was 40%.  ?Status post mastectomy T1b N0 M0 stage IA, Oncotype DX recurrence score is 24, 16% risk of recurrence intermediate risk ?  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  ?NCCN guidelines now recommend annual MRI screening OR a bilateral prophylactic mastectomy for PALB2 ?patient preferred Surveillance with annual breast MRIs   ?--------------------------------------------------------------------------------------------------------------------------------------------------------------  ?Current treatment: Anastrozole 1 mg daily started 03/22/2014 ?5 years; switched to letrozole 07/26/2016 ?Letrozole toxicities: ?  ?Major depression: On EffexorBreast Cancer Surveillance: ?1. Mammogram  06/13/2020  benign. Breast Density Category B. ?2.  Breast exam: 06/21/20: Benign ?3.  Breast MRI 10/22/2017: Benign, patient will need annual breast MRIs. ?  ?Congestive heart failure: Currently on therapy ?  ?Prior history of pernicious anemia on monthly B12 injections: B12 injections ?We will check labs today including iron and B12 levels.  I will call her with the results of this test. ?  ? ?Return to clinic in 1 year for follow-up ?  ? ? ? ?No orders of the defined types were placed in this encounter. ? ?The patient has a good  understanding of the overall plan. she agrees with it. she will call with any problems that may develop before the next visit here. ? ?Total time spent: 30 mins including face to face time and time spent for planning, charting and coordination of care ? ?Rulon Eisenmenger, MD, MPH ?06/21/2021 ? ?I, Thana Ates, am acting as scribe for Dr. Nicholas Lose. ? ?I have reviewed the above documentation for accuracy and completeness, and I agree with the above. ? ? ? ? ? ? ?

## 2021-06-20 NOTE — Telephone Encounter (Signed)
Pt called and asks if she can have lipid panel added for labs 06/21/21. Per MD,OK to add but PCP will need to manage if there are abnormal results. Called and LVM for pt to fast before labs.  ?

## 2021-06-21 ENCOUNTER — Inpatient Hospital Stay
Payer: Federal, State, Local not specified - PPO | Attending: Hematology and Oncology | Admitting: Hematology and Oncology

## 2021-06-21 ENCOUNTER — Other Ambulatory Visit: Payer: Self-pay

## 2021-06-21 ENCOUNTER — Inpatient Hospital Stay: Payer: Federal, State, Local not specified - PPO

## 2021-06-21 DIAGNOSIS — F329 Major depressive disorder, single episode, unspecified: Secondary | ICD-10-CM | POA: Diagnosis not present

## 2021-06-21 DIAGNOSIS — R5383 Other fatigue: Secondary | ICD-10-CM | POA: Diagnosis not present

## 2021-06-21 DIAGNOSIS — I509 Heart failure, unspecified: Secondary | ICD-10-CM | POA: Insufficient documentation

## 2021-06-21 DIAGNOSIS — D510 Vitamin B12 deficiency anemia due to intrinsic factor deficiency: Secondary | ICD-10-CM | POA: Diagnosis not present

## 2021-06-21 DIAGNOSIS — Z170 Estrogen receptor positive status [ER+]: Secondary | ICD-10-CM | POA: Diagnosis not present

## 2021-06-21 DIAGNOSIS — Z9011 Acquired absence of right breast and nipple: Secondary | ICD-10-CM | POA: Insufficient documentation

## 2021-06-21 DIAGNOSIS — C50411 Malignant neoplasm of upper-outer quadrant of right female breast: Secondary | ICD-10-CM

## 2021-06-21 LAB — CBC WITH DIFFERENTIAL (CANCER CENTER ONLY)
Abs Immature Granulocytes: 0.01 10*3/uL (ref 0.00–0.07)
Basophils Absolute: 0 10*3/uL (ref 0.0–0.1)
Basophils Relative: 0 %
Eosinophils Absolute: 0.1 10*3/uL (ref 0.0–0.5)
Eosinophils Relative: 1 %
HCT: 38.3 % (ref 36.0–46.0)
Hemoglobin: 12.7 g/dL (ref 12.0–15.0)
Immature Granulocytes: 0 %
Lymphocytes Relative: 29 %
Lymphs Abs: 2 10*3/uL (ref 0.7–4.0)
MCH: 30.8 pg (ref 26.0–34.0)
MCHC: 33.2 g/dL (ref 30.0–36.0)
MCV: 92.7 fL (ref 80.0–100.0)
Monocytes Absolute: 0.4 10*3/uL (ref 0.1–1.0)
Monocytes Relative: 6 %
Neutro Abs: 4.4 10*3/uL (ref 1.7–7.7)
Neutrophils Relative %: 64 %
Platelet Count: 193 10*3/uL (ref 150–400)
RBC: 4.13 MIL/uL (ref 3.87–5.11)
RDW: 14.1 % (ref 11.5–15.5)
WBC Count: 6.8 10*3/uL (ref 4.0–10.5)
nRBC: 0 % (ref 0.0–0.2)

## 2021-06-21 LAB — CMP (CANCER CENTER ONLY)
ALT: 10 U/L (ref 0–44)
AST: 19 U/L (ref 15–41)
Albumin: 4.6 g/dL (ref 3.5–5.0)
Alkaline Phosphatase: 77 U/L (ref 38–126)
Anion gap: 6 (ref 5–15)
BUN: 10 mg/dL (ref 8–23)
CO2: 29 mmol/L (ref 22–32)
Calcium: 9.6 mg/dL (ref 8.9–10.3)
Chloride: 104 mmol/L (ref 98–111)
Creatinine: 0.74 mg/dL (ref 0.44–1.00)
GFR, Estimated: >60 mL/min (ref >60)
Glucose, Bld: 98 mg/dL (ref 70–99)
Potassium: 4.1 mmol/L (ref 3.5–5.1)
Sodium: 139 mmol/L (ref 135–145)
Total Bilirubin: 0.6 mg/dL (ref 0.3–1.2)
Total Protein: 7.2 g/dL (ref 6.5–8.1)

## 2021-06-21 LAB — IRON AND IRON BINDING CAPACITY (CC-WL,HP ONLY)
Iron: 69 ug/dL (ref 28–170)
Saturation Ratios: 19 % (ref 10.4–31.8)
TIBC: 374 ug/dL (ref 250–450)
UIBC: 305 ug/dL (ref 148–442)

## 2021-06-21 LAB — LIPID PANEL
Cholesterol: 151 mg/dL (ref 0–200)
HDL: 42 mg/dL (ref >40)
LDL Cholesterol: 81 mg/dL (ref 0–99)
Total CHOL/HDL Ratio: 3.6 RATIO
Triglycerides: 140 mg/dL (ref <150)
VLDL: 28 mg/dL (ref 0–40)

## 2021-06-21 LAB — VITAMIN B12: Vitamin B-12: 396 pg/mL (ref 180–914)

## 2021-06-21 LAB — FERRITIN: Ferritin: 162 ng/mL (ref 11–307)

## 2021-06-21 NOTE — Assessment & Plan Note (Signed)
Right breast IDC?with high-grade DCIS with comedonecrosis ER/PR positive HER-2 negative Ki-67 was 40%.  ?Status post mastectomy T1b N0 M0 stage IA, Oncotype DX recurrence score is 24, 16% risk of recurrence intermediate risk ?? 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  ?NCCN guidelines now recommend annual MRI screening OR a bilateral prophylactic mastectomy for PALB2 ?patient preferred?Surveillance with annual breast MRIs? ?--------------------------------------------------------------------------------------------------------------------------------------------------------------? ?Current treatment: Anastrozole 1 mg daily started 03/22/2014 ?5 years; switched to letrozole 07/26/2016 ?Letrozole toxicities: ?? ?Major depression: Since she came off Effexor, her depression appears to be worse. She may consider taking Effexor every other day. ?? ?Breast Cancer Surveillance: ?1. Mammogram??06/13/2020??benign.?Breast Density Category B. ?2. ?Breast exam: 06/21/20: Benign ?3. ?Breast MRI 10/22/2017: Benign, patient will need annual breast MRIs. ?? ?Congestive heart failure: Currently on therapy ?? ?Prior history of pernicious anemia on monthly B12 injections:?B12 injections ?? ?Profound fatigue: It could be multifactorial between her heart, sugars, low blood pressures, medications etc.  She will to see an endocrinologist to understand whether or not she truly has diabetes.  Her A1c is 5.5 but she has a urine sugar of 1000. ?I increase the dosage of Effexor. ? ?Return to clinic in 1 year for follow-up ?? ?

## 2021-06-26 ENCOUNTER — Ambulatory Visit (INDEPENDENT_AMBULATORY_CARE_PROVIDER_SITE_OTHER): Payer: Federal, State, Local not specified - PPO

## 2021-06-26 DIAGNOSIS — Z95810 Presence of automatic (implantable) cardiac defibrillator: Secondary | ICD-10-CM | POA: Diagnosis not present

## 2021-06-26 DIAGNOSIS — I5022 Chronic systolic (congestive) heart failure: Secondary | ICD-10-CM

## 2021-06-26 NOTE — Progress Notes (Signed)
EPIC Encounter for ICM Monitoring ? ?Patient Name: Loretta Thomas is a 71 y.o. female ?Date: 06/26/2021 ?Primary Care Physican: Rossie Muskrat, DO ?Primary Cardiologist: Tselakai Dezza ?Electrophysiologist: Allred ?Bi-V Pacing:   >99%       ?06/26/2021 Weight: 191 lbs ?  ?AT/AF Burden: <1% ?                                             ?  ?Spoke with patient and heart failure questions reviewed.  Pt had swelling of fingers this past week but is better today..   ?  ?Corvue thoracic impedance suggesting possible fluid accumulation starting 3/6. ?  ?Prescribed:  ?Furosemide 40 mg take 1 tablet every other day.  She adjusts Furosemide as needed.   ?Spironolactone 25 mg take 0.5 tablet (12.5 mg total) by mouth daily ?Farxiga '10mg'$  take 1 tablet by mouth daily  ?  ?Labs: ?11/09/2020 Creatinine 0.77, BUN 11, Potassium 3.7, Sodium 138, GFR 84 ?A complete set of results can be found in Results Review. ?  ?Recommendations:  Advised to take Furosemide 40 mg x 2 consecutive days and then return to every other day ?  ?Follow-up plan: ICM clinic phone appointment on 07/04/2021 to recheck fluid levels.   91 day device clinic remote transmission 07/04/2021.   ?  ?EP/Cardiology Office Visits:  Recall 05/29/2021 with Oda Kilts, [place]   08/04/2021 with Dr Radford Pax.  Recall 12/13/2021 with Dr Haroldine Laws.  ?  ?Copy of ICM check sent to Dr. Rayann Heman and Dr Haroldine Laws as Juluis Rainier.  ? ?3 month ICM trend: 06/26/2021. ? ? ? ?12-14 Month ICM trend:  ? ? ? ?[name], RN ?06/26/2021 ?4:23 PM ? ?

## 2021-07-04 ENCOUNTER — Ambulatory Visit (INDEPENDENT_AMBULATORY_CARE_PROVIDER_SITE_OTHER): Payer: Federal, State, Local not specified - PPO

## 2021-07-04 DIAGNOSIS — I428 Other cardiomyopathies: Secondary | ICD-10-CM | POA: Diagnosis not present

## 2021-07-04 DIAGNOSIS — I5022 Chronic systolic (congestive) heart failure: Secondary | ICD-10-CM

## 2021-07-04 DIAGNOSIS — Z95810 Presence of automatic (implantable) cardiac defibrillator: Secondary | ICD-10-CM

## 2021-07-04 NOTE — Progress Notes (Signed)
EPIC Encounter for ICM Monitoring ? ?Patient Name: Loretta Thomas is a 71 y.o. female ?Date: 07/04/2021 ?Primary Care Physican: Rossie Muskrat, DO ?Primary Cardiologist: [place] ?Electrophysiologist: Allred ?Bi-V Pacing:   >99%       ?06/26/2021 Weight: 191 lbs ?07/04/2021 Weight: 191 lbs ?  ?AT/AF Burden: <1% ?                                             ?  ?Spoke with patient and heart failure questions reviewed.  Pt reports finger swelling has resolved and she is feeling fine.   ?  ?Corvue thoracic impedance suggesting fluid levels returned to normal after taking extra Furosemide. ?  ?Prescribed:  ?Furosemide 40 mg take 1 tablet every other day.  She adjusts Furosemide as needed.   ?Spironolactone 25 mg take 0.5 tablet (12.5 mg total) by mouth daily ?Farxiga '10mg'$  take 1 tablet by mouth daily  ?  ?Labs: ?06/21/2021 Creatinine 0.74, BUN 10, Potassium 4.1, Sodium 139, GFR >60 ?A complete set of results can be found in Results Review. ?  ?Recommendations:  No changes and encouraged to call if experiencing any fluid symptoms.  Advised message sent to EP scheduler to call to schedule a 6 month f/u with Oda Kilts, PA. ?  ?Follow-up plan: ICM clinic phone appointment on 07/31/2021.   91 day device clinic remote transmission 10/03/2021.   ?  ?EP/Cardiology Office Visits:  Recall 05/29/2021 with Oda Kilts, [place].   08/04/2021 with Dr Radford Pax (1st visit).  Recall 12/13/2021 with Dr Haroldine Laws.  ?  ?Copy of ICM check sent to Dr. Rayann Heman. ? ?3 month ICM trend: 07/04/2021. ? ? ? ?12-14 Month ICM trend:  ? ? ? ?[name], RN ?07/04/2021 ?7:47 AM ? ?

## 2021-07-05 LAB — CUP PACEART REMOTE DEVICE CHECK
Date Time Interrogation Session: 20230321065749
Implantable Lead Implant Date: 20190920
Implantable Lead Implant Date: 20190920
Implantable Lead Implant Date: 20190920
Implantable Lead Location: 753858
Implantable Lead Location: 753859
Implantable Lead Location: 753860
Implantable Pulse Generator Implant Date: 20190920
Pulse Gen Serial Number: 9833194

## 2021-07-19 NOTE — Progress Notes (Signed)
Remote ICD transmission.   

## 2021-07-31 ENCOUNTER — Other Ambulatory Visit: Payer: Self-pay | Admitting: *Deleted

## 2021-07-31 ENCOUNTER — Ambulatory Visit (INDEPENDENT_AMBULATORY_CARE_PROVIDER_SITE_OTHER): Payer: Federal, State, Local not specified - PPO

## 2021-07-31 DIAGNOSIS — Z95810 Presence of automatic (implantable) cardiac defibrillator: Secondary | ICD-10-CM

## 2021-07-31 DIAGNOSIS — C50411 Malignant neoplasm of upper-outer quadrant of right female breast: Secondary | ICD-10-CM

## 2021-07-31 DIAGNOSIS — I5022 Chronic systolic (congestive) heart failure: Secondary | ICD-10-CM

## 2021-07-31 MED ORDER — VENLAFAXINE HCL ER 75 MG PO CP24: 75.0000 mg | ORAL_CAPSULE | Freq: Every day | ORAL | 3 refills | Status: DC

## 2021-07-31 NOTE — Progress Notes (Signed)
EPIC Encounter for ICM Monitoring ? ?Patient Name: Loretta Thomas is a 71 y.o. female ?Date: 07/31/2021 ?Primary Care Physican: Loretta Muskrat, DO ?Primary Cardiologist: [place] ?Electrophysiologist: [place] ?Bi-V Pacing:   >99%       ?06/26/2021 Weight: 191 lbs ?07/04/2021 Weight: 191 lbs ?08/01/2021 Weight: no recent home weight. ?  ?AT/AF Burden: <1% ?                                             ?  ?Spoke with patient and heart failure questions reviewed.  Pt swelling abdomen and feet and thinks urine output is lower than normal.   She has not been following low salt diet.  Encouraged to weigh daily after getting OOB and consistent with clothes for most accurate weight. ?  ?Corvue thoracic impedance suggesting possible fluid accumulation starting 4/16.  Impedance was suggesting possible dryness from 4/5--4/13.   ?  ?Prescribed:  ?Furosemide 40 mg take 1 tablet every other day.  She self adjusts Furosemide as needed.   ?Spironolactone 25 mg take 0.5 tablet (12.5 mg total) by mouth daily ?Farxiga '10mg'$  take 1 tablet by mouth daily  ?  ?Labs: ?06/21/2021 Creatinine 0.74, BUN 10, Potassium 4.1, Sodium 139, GFR >60 ?A complete set of results can be found in Results Review. ?  ?Recommendations:  Discussed reviewing food labels and limiting salt intake to 2000 mg daily and fluid intake to 64 oz daily.   Advised to take Furosemide 1 tablet for 3 consecutive days and then return to every there day. ?  ?Follow-up plan: ICM clinic phone appointment on 08/08/2021 to recheck fluid levels.   91 day device clinic remote transmission 10/03/2021.   ?  ?EP/Cardiology Office Visits:  08/07/2021 with Loretta Thomas, [place].   08/08/2021 with Dr Radford Pax (1st visit).  Recall 12/13/2021 with Dr Haroldine Laws.  ?  ?Copy of ICM check sent to Dr. Rayann Heman and Dr Haroldine Laws as Juluis Rainier and review. ? ?3 month ICM trend: 07/31/2021. ? ? ? ?12-14 Month ICM trend:  ? ? ? ?[name], RN ?07/31/2021 ?8:47 AM ? ?

## 2021-07-31 NOTE — Progress Notes (Signed)
? ? ?Electrophysiology Office Note ?Date: 07/31/2021 ? ?ID:  Loretta Thomas, DOB 1950-07-21, MRN [id number] ? ?PCP: Rossie Muskrat, DO ?Primary Cardiologist: None ?Electrophysiologist: Thompson Grayer, MD  ? ?CC: Routine ICD follow-up ? ?Loretta Thomas is a 71 y.o. female seen today for Thompson Grayer, MD for routine electrophysiology followup.  Since last being seen in our clinic the patient reports doing well overall. She is working on Financial controller so that she may eventually be considered for BAT. Currently, she feels well enough she would not like to aggressively pursue.  She has fatigue at times, but otherwise is exerting without limitations..  she denies chest pain, palpitations, dyspnea, PND, orthopnea, nausea, vomiting, dizziness, syncope, edema, weight gain, or early satiety. She has not had ICD shocks.  ? ?Device History: ?St. Jude BiV ICD implanted 12/2017 for CHF/ LBBB ? ?Past Medical History:  ?Diagnosis Date  ? Anxiety   ? Bladder cystocele   ? Breast cancer ([place])   ? s/p L breast cancer removal  ? Bundle branch block, left   ? diagnosed 03/2012 followed by cath showing only minimal CAD  ? Bursitis   ? Cancer [facility])   ? right breast  ? Capsulitis   ? CHF (congestive heart failure) ([place])   ? Diverticulosis   ? Elevated liver enzymes 2007  ? History of IBS   ? Hyperlipidemia   ? Hypertension   ? Nonischemic cardiomyopathy ([place])   ? pernicious anemia   ? ?Past Surgical History:  ?Procedure Laterality Date  ? ANKLE FRACTURE SURGERY    ? BIV ICD INSERTION CRT-D N/A 01/03/2018  ?  [place] model CD3369-40Q (serial  Number [id number]) biventricular ICD for primary prevention of sudden death  ? BREAST BIOPSY    ? right breast  ? BREAST RECONSTRUCTION WITH PLACEMENT OF TISSUE EXPANDER AND FLEX HD (ACELLULAR HYDRATED DERMIS) Right 01/28/2014  ? Procedure: RIGHT BREAST RECONSTRUCTION WITH PLACEMENT OF TISSUE EXPANDER;  Surgeon: Crissie Reese, MD;  Location: [place];  Service: Plastics;   Laterality: Right;  ? CARDIAC CATHETERIZATION    ? x3; 03/18/12 [facility]): 10-20% pLAD, <20% ostial LCX, 20% mPLA, EF 60%.    ? CHOLECYSTECTOMY    ? COLONOSCOPY    ? x4  ? DILATION AND CURETTAGE OF UTERUS    ? MASTECTOMY Right   ? malignant  ? REDUCTION MAMMAPLASTY Left   ? RHINOPLASTY    ? SIMPLE MASTECTOMY WITH AXILLARY SENTINEL NODE BIOPSY Right 01/28/2014  ? Procedure: RIGHT TOTAL MASTECTOMY WITH  RIGHT AXILLARY SENTINEL NODE BIOPSY;  Surgeon: Excell Seltzer, MD;  Location: [place];  Service: General;  Laterality: Right;  ? TONSILLECTOMY AND ADENOIDECTOMY    ? TUBAL LIGATION    ? ? ?Current Outpatient Medications  ?Medication Sig Dispense Refill  ? acetaminophen (TYLENOL) 500 MG tablet Take 500 mg by mouth every 6 (six) hours as needed.     ? ALPRAZolam (XANAX) 0.25 MG tablet Take 1 tablet (0.25 mg total) by mouth 2 (two) times daily as needed for anxiety. 30 tablet 0  ? Biotin 5000 MCG CAPS Take 1 capsule by mouth daily in the afternoon.    ? Calcium Citrate-Vitamin D (CALCIUM CITRATE +D PO) Take 1 tablet by mouth every other day.    ? Calcium Citrate-Vitamin D (CITRACAL + D PO) Take 1,200 mg by mouth daily.    ? carvedilol (COREG) 3.125 MG tablet Take 1 tablet (3.125 mg total) by mouth 2 (two) times  daily with a meal. 180 tablet 2  ? Coenzyme Q10 (CO Q10) 100 MG CAPS Take by mouth daily.    ? CRANBERRY CONCENTRATE PO Take 1 tablet by mouth daily.    ? cyanocobalamin (,VITAMIN B-12,) 1000 MCG/ML injection Inject 1 mL (1,000 mcg total) into the muscle every 30 (thirty) days. 10 mL 0  ? dapagliflozin propanediol (FARXIGA) 10 MG TABS tablet Take 1 tablet (10 mg total) by mouth daily before breakfast. 30 tablet 11  ? fluticasone (FLONASE SENSIMIST) 27.5 MCG/SPRAY nasal spray 27.5 sprays as needed.    ? furosemide (LASIX) 40 MG tablet TAKE 1 TABLET BY MOUTH EVERY OTHER DAY 45 tablet 3  ? letrozole (FEMARA) 2.5 MG tablet Take 1 tablet (2.5 mg total) by mouth daily. 90 tablet 1  ? losartan  (COZAAR) 100 MG tablet Take 1 tablet (100 mg total) by mouth daily. 90 tablet 2  ? Multiple Vitamin (MULTIVITAMIN WITH MINERALS) TABS tablet Take 1 tablet by mouth daily.    ? rosuvastatin (CRESTOR) 5 MG tablet Take 1 tablet (5 mg total) by mouth daily. 90 tablet 0  ? spironolactone (ALDACTONE) 25 MG tablet TAKE 1 TABLET BY MOUTH EVERY DAY 90 tablet 3  ? venlafaxine XR (EFFEXOR-XR) 75 MG 24 hr capsule Take 1 capsule (75 mg total) by mouth daily with breakfast. 90 capsule 3  ? Vitamin D, Ergocalciferol, (DRISDOL) 50000 UNITS CAPS capsule Take 50,000 Units by mouth every 7 (seven) days. Fridays    ? ?No current facility-administered medications for this visit.  ? ? ?Allergies:   Benzocaine, Codeine, Demerol [meperidine], Hydrocodone, Lortab [hydrocodone-acetaminophen], and Mangifera indica  ? ?Social History: ?Social History  ? ?Socioeconomic History  ? Marital status: Married  ?  Spouse name: Not on file  ? Number of children: Not on file  ? Years of education: Not on file  ? Highest education level: Not on file  ?Occupational History  ? Not on file  ?Tobacco Use  ? Smoking status: Never  ? Smokeless tobacco: Never  ?Vaping Use  ? Vaping Use: Never used  ?Substance and Sexual Activity  ? Alcohol use: No  ? Drug use: No  ? Sexual activity: Yes  ?Other Topics Concern  ? Not on file  ?Social History Narrative  ? Lives in [place]  ? ?Social Determinants of Health  ? ?Financial Resource Strain: Not on file  ?Food Insecurity: Not on file  ?Transportation Needs: Not on file  ?Physical Activity: Not on file  ?Stress: Not on file  ?Social Connections: Not on file  ?Intimate Partner Violence: Not on file  ? ? ?Family History: ?Family History  ?Problem Relation Age of Onset  ? Cancer Sister   ?     skin and colon x2  ? Cancer Brother   ?     esophageal  ? Cancer Father 21  ?     mouth  ? Cancer Maternal Aunt 55  ?     breast  ? Breast cancer Maternal Aunt 39  ? Cancer Maternal Grandmother 31  ?     ovarian  ? Cancer Cousin  2  ?     mat 1st cousin with breast  ? Breast cancer Cousin 28  ? Cancer Paternal Grandfather 56  ?     lung  ? ? ?Review of Systems: ?All other systems reviewed and are otherwise negative except as noted above. ? ? ?Physical Exam: ?There were no vitals filed for this visit.  ? ?GEN- The patient  is well appearing, alert and oriented x 3 today.   ?HEENT: normocephalic, atraumatic; sclera clear, conjunctiva pink; hearing intact; oropharynx clear; neck supple, no JVP ?Lymph- no cervical lymphadenopathy ?Lungs- Clear to ausculation bilaterally, normal work of breathing.  No wheezes, rales, rhonchi ?Heart- Regular rate and rhythm, no murmurs, rubs or gallops, PMI not laterally displaced ?GI- soft, non-tender, non-distended, bowel sounds present, no hepatosplenomegaly ?Extremities- no clubbing or cyanosis. No edema; DP/PT/radial pulses 2+ bilaterally ?MS- no significant deformity or atrophy ?Skin- warm and dry, no rash or lesion; ICD pocket well healed ?Psych- euthymic mood, full affect ?Neuro- strength and sensation are intact ? ?ICD interrogation- reviewed in detail today,  See PACEART report ? ?EKG:  EKG is ordered today. ?Personal review of EKG ordered today shows BiV paced at 78 bpm, QRS 128 ms with MSP ? ?Recent Labs: ?11/09/2020: NT-Pro BNP 29 ?06/21/2021: ALT 10; BUN 10; Creatinine 0.74; Hemoglobin 12.7; Platelet Count 193; Potassium 4.1; Sodium 139  ? ?Wt Readings from Last 3 Encounters:  ?06/21/21 191 lb 9.6 oz (86.9 kg)  ?06/16/21 193 lb 3.2 oz (87.6 kg)  ?01/02/21 192 lb (87.1 kg)  ?  ? ?Other studies Reviewed: ?Additional studies/ records that were reviewed today include: Previous EP office notes.  ? ?Assessment and Plan: ? ?1.  Chronic systolic dysfunction s/p St. Jude CRT-D  ?euvolemic today ?Stable on an appropriate medical regimen ?Normal ICD function ?See Claudia Desanctis Art report ?No changes today ?Multi-site pacing turned on 06/17/20. She feels better with this.  ?LV2 safety margin reduced to 0.5V with chronic  stability, consider doing same to LV1 at next visit.  ?NYHA II symptoms currently.  ?She has been screened for BAT. She is a candidate though would like to defer consideration for now. We were unable to get approval thr

## 2021-08-07 ENCOUNTER — Encounter: Payer: Self-pay | Admitting: Student

## 2021-08-07 ENCOUNTER — Ambulatory Visit: Payer: Federal, State, Local not specified - PPO | Admitting: Student

## 2021-08-07 VITALS — BP 112/76 | HR 78 | Ht 67.0 in | Wt 193.6 lb

## 2021-08-07 DIAGNOSIS — I5022 Chronic systolic (congestive) heart failure: Secondary | ICD-10-CM | POA: Diagnosis not present

## 2021-08-07 DIAGNOSIS — I10 Essential (primary) hypertension: Secondary | ICD-10-CM

## 2021-08-07 LAB — CUP PACEART INCLINIC DEVICE CHECK
Battery Remaining Longevity: 42 mo
Brady Statistic RA Percent Paced: 0.07 %
Brady Statistic RV Percent Paced: 99.92 %
Date Time Interrogation Session: 20230424121915
HighPow Impedance: 84.375
Implantable Lead Implant Date: 20190920
Implantable Lead Implant Date: 20190920
Implantable Lead Implant Date: 20190920
Implantable Lead Location: 753858
Implantable Lead Location: 753859
Implantable Lead Location: 753860
Implantable Pulse Generator Implant Date: 20190920
Lead Channel Impedance Value: 1200 Ohm
Lead Channel Impedance Value: 400 Ohm
Lead Channel Impedance Value: 562.5 Ohm
Lead Channel Pacing Threshold Amplitude: 0.5 V
Lead Channel Pacing Threshold Amplitude: 0.5 V
Lead Channel Pacing Threshold Amplitude: 0.75 V
Lead Channel Pacing Threshold Amplitude: 0.75 V
Lead Channel Pacing Threshold Amplitude: 0.875 V
Lead Channel Pacing Threshold Pulse Width: 0.5 ms
Lead Channel Pacing Threshold Pulse Width: 0.5 ms
Lead Channel Pacing Threshold Pulse Width: 0.5 ms
Lead Channel Pacing Threshold Pulse Width: 0.5 ms
Lead Channel Pacing Threshold Pulse Width: 0.5 ms
Lead Channel Sensing Intrinsic Amplitude: 12 mV
Lead Channel Sensing Intrinsic Amplitude: 4.3 mV
Lead Channel Setting Pacing Amplitude: 2 V
Lead Channel Setting Pacing Amplitude: 2 V
Lead Channel Setting Pacing Amplitude: 2 V
Lead Channel Setting Pacing Pulse Width: 0.5 ms
Lead Channel Setting Pacing Pulse Width: 0.5 ms
Lead Channel Setting Sensing Sensitivity: 0.5 mV
Pulse Gen Serial Number: 9833194

## 2021-08-07 NOTE — Patient Instructions (Signed)

## 2021-08-08 ENCOUNTER — Encounter: Payer: Self-pay | Admitting: Cardiology

## 2021-08-08 ENCOUNTER — Ambulatory Visit: Payer: Federal, State, Local not specified - PPO

## 2021-08-08 ENCOUNTER — Ambulatory Visit (INDEPENDENT_AMBULATORY_CARE_PROVIDER_SITE_OTHER): Payer: Federal, State, Local not specified - PPO | Admitting: Cardiology

## 2021-08-08 VITALS — BP 118/78 | HR 74 | Wt 193.0 lb

## 2021-08-08 DIAGNOSIS — I10 Essential (primary) hypertension: Secondary | ICD-10-CM | POA: Diagnosis not present

## 2021-08-08 DIAGNOSIS — G4733 Obstructive sleep apnea (adult) (pediatric): Secondary | ICD-10-CM | POA: Diagnosis not present

## 2021-08-08 DIAGNOSIS — I428 Other cardiomyopathies: Secondary | ICD-10-CM | POA: Diagnosis not present

## 2021-08-08 DIAGNOSIS — I5022 Chronic systolic (congestive) heart failure: Secondary | ICD-10-CM

## 2021-08-08 DIAGNOSIS — E7800 Pure hypercholesterolemia, unspecified: Secondary | ICD-10-CM | POA: Diagnosis not present

## 2021-08-08 DIAGNOSIS — Z95810 Presence of automatic (implantable) cardiac defibrillator: Secondary | ICD-10-CM

## 2021-08-08 MED ORDER — ROSUVASTATIN CALCIUM 5 MG PO TABS: 5.0000 mg | ORAL_TABLET | Freq: Every day | ORAL | 3 refills | Status: DC

## 2021-08-08 NOTE — Patient Instructions (Addendum)
Medication Instructions:  ?Your physician recommends that you continue on your current medications as directed. Please refer to the Current Medication list given to you today. ? ? ?Labwork: ?None today ? ?Testing/Procedures: ?Your physician has recommended that you have a sleep study. This test records several body functions during sleep, including: brain activity, eye movement, oxygen and carbon dioxide blood levels, heart rate and rhythm, breathing rate and rhythm, the flow of air through your mouth and nose, snoring, body muscle movements, and chest and belly movement. ? ? ?Follow-Up: ?Dr.Turner's nurse, Gae Bon, will call you with test results ? ?Any Other Special Instructions Will Be Listed Below (If Applicable). ? ?If you need a refill on your cardiac medications before your next appointment, please call your pharmacy. ? ? ? ? ? ?Two Gram Sodium Diet 2000 mg ? ?What is Sodium? Sodium is a mineral found naturally in many foods. The most significant source of sodium in the diet is table salt, which is about 40% sodium.  Processed, convenience, and preserved foods also contain a large amount of sodium.  The body needs only 500 mg of sodium daily to function,  A normal diet provides more than enough sodium even if you do not use salt. ? ?Why Limit Sodium? A build up of sodium in the body can cause thirst, increased blood pressure, shortness of breath, and water retention.  Decreasing sodium in the diet can reduce edema and risk of heart attack or stroke associated with high blood pressure.  Keep in mind that there are many other factors involved in these health problems.  Heredity, obesity, lack of exercise, cigarette smoking, stress and what you eat all play a role. ? ?General Guidelines: ?Do not add salt at the table or in cooking.  One teaspoon of salt contains over 2 grams of sodium. ?Read food labels ?Avoid processed and convenience foods ?Ask your dietitian before eating any foods not dicussed in the menu  planning guidelines ?Consult your physician if you wish to use a salt substitute or a sodium containing medication such as antacids.  Limit milk and milk products to 16 oz (2 cups) per day. ? ?Shopping Hints: ?READ LABELS!! "Dietetic" does not necessarily mean low sodium. ?Salt and other sodium ingredients are often added to foods during processing. ? ? ? ?Menu Planning Guidelines ?Food Group Choose More Often Avoid  ?Beverages (see also the milk group All fruit juices, low-sodium, salt-free vegetables juices, low-sodium carbonated beverages Regular vegetable or tomato juices, commercially softened water used for drinking or cooking  ?Breads and Cereals Enriched white, wheat, rye and pumpernickel bread, hard rolls and dinner rolls; muffins, cornbread and waffles; most dry cereals, cooked cereal without added salt; unsalted crackers and breadsticks; low sodium or homemade bread crumbs Bread, rolls and crackers with salted tops; quick breads; instant hot cereals; pancakes; commercial bread stuffing; self-rising flower and biscuit mixes; regular bread crumbs or cracker crumbs  ?Desserts and Sweets Desserts and sweets mad with mild should be within allowance Instant pudding mixes and cake mixes  ?Fats Butter or margarine; vegetable oils; unsalted salad dressings, regular salad dressings limited to 1 Tbs; light, sour and heavy cream Regular salad dressings containing bacon fat, bacon bits, and salt pork; snack dips made with instant soup mixes or processed cheese; salted nuts  ?Fruits Most fresh, frozen and canned fruits Fruits processed with salt or sodium-containing ingredient (some dried fruits are processed with sodium sulfites  ? ? ? ? ? ? ?Vegetables Fresh, frozen vegetables and low- sodium  canned vegetables Regular canned vegetables, sauerkraut, pickled vegetables, and others prepared in brine; frozen vegetables in sauces; vegetables seasoned with ham, bacon or salt pork  ?Condiments, Sauces, Miscellaneous ? Salt  substitute with physician's approval; pepper, herbs, spices; vinegar, lemon or lime juice; hot pepper sauce; garlic powder, onion powder, low sodium soy sauce (1 Tbs.); low sodium condiments (ketchup, chili sauce, mustard) in limited amounts (1 tsp.) fresh ground horseradish; unsalted tortilla chips, pretzels, potato chips, popcorn, salsa (1/4 cup) Any seasoning made with salt including garlic salt, celery salt, onion salt, and seasoned salt; sea salt, rock salt, kosher salt; meat tenderizers; monosodium glutamate; mustard, regular soy sauce, barbecue, sauce, chili sauce, teriyaki sauce, steak sauce, Worcestershire sauce, and most flavored vinegars; canned gravy and mixes; regular condiments; salted snack foods, olives, picles, relish, horseradish sauce, catsup  ? ?Food preparation: Try these seasonings ?Meats:    ?Pork Sage, onion Serve with applesauce  ?Chicken Poultry seasoning, thyme, parsley Serve with cranberry sauce  ?Lamb Curry powder, rosemary, garlic, thyme Serve with mint sauce or jelly  ?Veal Marjoram, basil Serve with current jelly, cranberry sauce  ?Beef Pepper, bay leaf Serve with dry mustard, unsalted chive butter  ?[place] leaf, dill Serve with unsalted lemon butter, unsalted parsley butter  ?Vegetables:    ?Asparagus Lemon juice   ?Broccoli Lemon juice   ?Carrots Mustard dressing parsley, mint, nutmeg, glazed with unsalted butter and sugar   ?Green beans Marjoram, lemon juice, nutmeg,dill seed   ?Tomatoes Basil, marjoram, onion   ?Spice /blend for "Salt Shaker" 4 tsp ground thyme ?1 tsp ground sage 3 tsp ground rosemary ?4 tsp ground marjoram  ? ?Test your knowledge ?A product that says "Salt Free" may still contain sodium. True or False ?Garlic Powder and Hot Pepper Sauce an be used as alternative seasonings.True or False ?Processed foods have more sodium than fresh foods.  True or False ?Canned Vegetables have less sodium than froze True or False ? ? ?WAYS TO DECREASE YOUR SODIUM INTAKE ?Avoid  the use of added salt in cooking and at the table.  Table salt (and other prepared seasonings which contain salt) is probably one of the greatest sources of sodium in the diet.  Unsalted foods can gain flavor from the sweet, sour, and butter taste sensations of herbs and spices.  Instead of using salt for seasoning, try the following seasonings with the foods listed.  Remember: how you use them to enhance natural food flavors is limited only by your creativity... ?Allspice-Meat, fish, eggs, fruit, peas, red and yellow vegetables ?Almond Extract-Fruit baked goods ?Anise Seed-Sweet breads, fruit, carrots, beets, cottage cheese, cookies (tastes like licorice) ?Basil-Meat, fish, eggs, vegetables, rice, vegetables salads, soups, sauces ?Bay Leaf-Meat, fish, stews, poultry ?Burnet-Salad, vegetables (cucumber-like flavor) ?Caraway Seed-Bread, cookies, cottage cheese, meat, vegetables, cheese, rice ?Cardamon-Baked goods, fruit, soups ?Celery Powder or seed-Salads, salad dressings, sauces, meatloaf, soup, bread.Do not use  celery salt ?Chervil-Meats, salads, fish, eggs, vegetables, cottage cheese (parsley-like flavor) ?Chili Power-Meatloaf, chicken cheese, corn, eggplant, egg dishes ?Chives-Salads cottage cheese, egg dishes, soups, vegetables, sauces ?Cilantro-Salsa, casseroles ?Cinnamon-Baked goods, fruit, pork, lamb, chicken, carrots ?Cloves-Fruit, baked goods, fish, pot roast, green beans, beets, carrots ?Coriander-Pastry, cookies, meat, salads, cheese (lemon-orange flavor) ?Cumin-Meatloaf, fish,cheese, eggs, cabbage,fruit pie (caraway flavor) ?[employer], fruit, eggs, fish, poultry, cottage cheese, vegetables ?Dill Seed-Meat, cottage cheese, poultry, vegetables, fish, salads, bread ?Fennel Seed-Bread, cookies, apples, pork, eggs, fish, beets, cabbage, cheese, Licorice-like flavor ?Garlic-(buds or powder) Salads, meat, poultry, fish, bread, butter, vegetables, potatoes.Do not  use  garlic salt ?Ginger-Fruit,  vegetables, baked goods, meat, fish, poultry ?Horseradish Root-Meet, vegetables, butter ?Lemon Juice or Extract-Vegetables, fruit, tea, baked goods, fish salads ?Mace-Baked goods fruit, vegetables, fish, poultry (

## 2021-08-08 NOTE — Progress Notes (Signed)
? ?Date:  08/08/2021  ? ?ID:  Loretta Thomas, DOB December 05, 1950, MRN [id number] ? ?PCP:  Rossie Muskrat, DO  ?Cardiologist:  Glori Bickers, MD ?Sleep Medicine:  Fransico Him, MD ?Electrophysiologist:  Thompson Grayer, MD  ? ?Chief Complaint:  OSA ? ?History of Present Illness:   ? ?Loretta Thomas is a 71 y.o. female  with a hx of HLD, HTN and NICM with EF 20-25% who was referred for sleep study due to CHF.  She underwent home sleep study and was found to have mild OSA with an AHI of 7.1/hr with mild snoring and O2 sats as low as 83% but did not qualify has nocturnal hypoxemia.  I saw her back in 2021 at which time we decided to start her on auto CPAP.  She is now here for follow-up.    ? ?She is followed by Dr. Haroldine Laws for her DCM and HTN.  Her PCP had been following her HLD but she does not like her PCP.  She tells me that she never started the CPAP because she had to pay $600 out of pocket.   She tells me that she still feels tired when she wakes up in the am and during the day and takes naps.  She admits to snoring some and has awakened snoring and gasping for breath.  She sleeps on her couch with her back propped up at night.    ? ?Prior CV studies:   ?The following studies were reviewed today: ? ?Home sleep study ? ?Past Medical History:  ?Diagnosis Date  ? Anxiety   ? Bladder cystocele   ? Breast cancer (Loretta Thomas)   ? s/p L breast cancer removal  ? Bundle branch block, left   ? diagnosed 03/2012 followed by cath showing only minimal CAD  ? Bursitis   ? Cancer [facility])   ? right breast  ? Capsulitis   ? CHF (congestive heart failure) ([place])   ? Diverticulosis   ? Elevated liver enzymes 2007  ? History of IBS   ? Hyperlipidemia   ? Hypertension   ? Nonischemic cardiomyopathy ([place])   ? pernicious anemia   ? ?Past Surgical History:  ?Procedure Laterality Date  ? ANKLE FRACTURE SURGERY    ? BIV ICD INSERTION CRT-D N/A 01/03/2018  ?  [place] model CD3369-40Q (serial  Number [id number]) biventricular  ICD for primary prevention of sudden death  ? BREAST BIOPSY    ? right breast  ? BREAST RECONSTRUCTION WITH PLACEMENT OF TISSUE EXPANDER AND FLEX HD (ACELLULAR HYDRATED DERMIS) Right 01/28/2014  ? Procedure: RIGHT BREAST RECONSTRUCTION WITH PLACEMENT OF TISSUE EXPANDER;  Surgeon: Crissie Reese, MD;  Location: [place];  Service: Plastics;  Laterality: Right;  ? CARDIAC CATHETERIZATION    ? x3; 03/18/12 [facility] [place]): 10-20% pLAD, <20% ostial LCX, 20% mPLA, EF 60%.    ? CHOLECYSTECTOMY    ? COLONOSCOPY    ? x4  ? DILATION AND CURETTAGE OF UTERUS    ? MASTECTOMY Right   ? malignant  ? REDUCTION MAMMAPLASTY Left   ? RHINOPLASTY    ? SIMPLE MASTECTOMY WITH AXILLARY SENTINEL NODE BIOPSY Right 01/28/2014  ? Procedure: RIGHT TOTAL MASTECTOMY WITH  RIGHT AXILLARY SENTINEL NODE BIOPSY;  Surgeon: Excell Seltzer, MD;  Location: [place];  Service: General;  Laterality: Right;  ? TONSILLECTOMY AND ADENOIDECTOMY    ? TUBAL LIGATION    ?  ? ?Current Meds  ?Medication Sig  ? acetaminophen (TYLENOL) 500  MG tablet Take 500 mg by mouth every 6 (six) hours as needed.   ? ALPRAZolam (XANAX) 0.25 MG tablet Take 1 tablet (0.25 mg total) by mouth 2 (two) times daily as needed for anxiety.  ? Biotin 5000 MCG CAPS Take 1 capsule by mouth daily in the afternoon.  ? Calcium Citrate-Vitamin D (CALCIUM CITRATE +D PO) Take 1 tablet by mouth every other day.  ? Calcium Citrate-Vitamin D (CITRACAL + D PO) Take 1,200 mg by mouth daily.  ? carvedilol (COREG) 3.125 MG tablet Take 1 tablet (3.125 mg total) by mouth 2 (two) times daily with a meal.  ? Coenzyme Q10 (CO Q10) 100 MG CAPS Take by mouth daily.  ? CRANBERRY CONCENTRATE PO Take 1 tablet by mouth daily.  ? cyanocobalamin (,VITAMIN B-12,) 1000 MCG/ML injection Inject 1 mL (1,000 mcg total) into the muscle every 30 (thirty) days.  ? dapagliflozin propanediol (FARXIGA) 10 MG TABS tablet Take 1 tablet (10 mg total) by mouth daily before breakfast.  ? fluticasone (FLONASE SENSIMIST)  27.5 MCG/SPRAY nasal spray 27.5 sprays as needed.  ? furosemide (LASIX) 40 MG tablet TAKE 1 TABLET BY MOUTH EVERY OTHER DAY  ? letrozole (FEMARA) 2.5 MG tablet Take 1 tablet (2.5 mg total) by mouth daily.  ? losartan (COZAAR) 100 MG tablet Take 1 tablet (100 mg total) by mouth daily.  ? Multiple Vitamin (MULTIVITAMIN WITH MINERALS) TABS tablet Take 1 tablet by mouth daily.  ? rosuvastatin (CRESTOR) 5 MG tablet Take 1 tablet (5 mg total) by mouth daily.  ? spironolactone (ALDACTONE) 25 MG tablet TAKE 1 TABLET BY MOUTH EVERY DAY  ? venlafaxine XR (EFFEXOR-XR) 75 MG 24 hr capsule Take 1 capsule (75 mg total) by mouth daily with breakfast.  ? Vitamin D, Ergocalciferol, (DRISDOL) 50000 UNITS CAPS capsule Take 50,000 Units by mouth every 7 (seven) days. Fridays  ?  ? ?Allergies:   Benzocaine, Codeine, Demerol [meperidine], Hydrocodone, Lortab [hydrocodone-acetaminophen], and Mangifera indica  ? ?Social History  ? ?Tobacco Use  ? Smoking status: Never  ? Smokeless tobacco: Never  ?Vaping Use  ? Vaping Use: Never used  ?Substance Use Topics  ? Alcohol use: No  ? Drug use: No  ?  ? ?Family Hx: ?The patient's family history includes Breast cancer (age of onset: 54) in her cousin; Breast cancer (age of onset: 60) in her maternal aunt; Cancer in her brother and sister; Cancer (age of onset: 3) in her cousin; Cancer (age of onset: 91) in her maternal aunt; Cancer (age of onset: 26) in her maternal grandmother; Cancer (age of onset: 53) in her paternal grandfather; Cancer (age of onset: 28) in her father. ? ?ROS:   ?Please see the history of present illness.    ? ?All other systems reviewed and are negative. ? ? ?Labs/Other Tests and Data Reviewed:   ? ?Recent Labs: ?11/09/2020: NT-Pro BNP 29 ?06/21/2021: ALT 10; BUN 10; Creatinine 0.74; Hemoglobin 12.7; Platelet Count 193; Potassium 4.1; Sodium 139  ? ?Recent Lipid Panel ?Lab Results  ?Component Value Date/Time  ? CHOL 151 06/21/2021 11:43 AM  ? TRIG 140 06/21/2021 11:43 AM  ? HDL  42 06/21/2021 11:43 AM  ? CHOLHDL 3.6 06/21/2021 11:43 AM  ? LDLCALC 81 06/21/2021 11:43 AM  ? ? ?Wt Readings from Last 3 Encounters:  ?08/08/21 193 lb (87.5 kg)  ?08/07/21 193 lb 9.6 oz (87.8 kg)  ?06/21/21 191 lb 9.6 oz (86.9 kg)  ?  ? ?Objective:   ? ?Vital Signs:  BP 118/78  Pulse 74   Wt 193 lb (87.5 kg)   SpO2 96%   BMI 30.23 kg/m?   ?GEN: Well nourished, well developed in no acute distress ?HEENT: Normal ?NECK: No JVD; No carotid bruits ?LYMPHATICS: No lymphadenopathy ?CARDIAC:RRR, no murmurs, rubs, gallops ?RESPIRATORY:  Clear to auscultation without rales, wheezing or rhonchi  ?ABDOMEN: Soft, non-tender, non-distended ?MUSCULOSKELETAL:  No edema; No deformity  ?SKIN: Warm and dry ?NEUROLOGIC:  Alert and oriented x 3 ?PSYCHIATRIC:  Normal affect   ? ?ASSESSMENT & PLAN:   ? ?1.  OSA ?-the results of her home sleep study showed mild OSA with an AHI of 7.1/hr and no central events or nocturnal hypoxemia.   ?-she was started on auto CPAP in 2021 but never followed through with getting the device because it was too expensive ?-she is now on [employer] and is getting on Medicare ?-will set up for split night sleep study ? ?2.  HTN ?-BP controlled on exam ?-Continue prescription drug management with carvedilol 3.125 mg BID, Farxiga '10mg'$  daily, Lasix '40mg'$  QOD, Losartan '100mg'$  daily and spiro '25mg'$  daily ? ?3.  NICM ?-followed by AHF service and EP ?-EF 20-25%>>improved by recent echo to 30-35%. ? ?4.  HLD ?-LDL goal < 100 ?-I have personally reviewed and interpreted outside labs performed by patient's PCP which showed LDL 81, HDL 42, TAGs 140, ALT 10 ?-continue prescription drug management with Crestor '5mg'$  daily with PRN refills ? ?Medication Adjustments/Labs and Tests Ordered: ?Current medicines are reviewed at length with the patient today.  Concerns regarding medicines are outlined above.  ?Tests Ordered: ?No orders of the defined types were placed in this encounter. ? ?Medication Changes: ?No  orders of the defined types were placed in this encounter. ? ? ?Disposition:  Follow up in 8 week(s) after getting her device ?  ?Signed, ?Fransico Him, MD  ?08/08/2021 1:46 PM    ?[place]

## 2021-08-08 NOTE — Addendum Note (Signed)
Addended by: Barbarann Ehlers A on: 08/08/2021 01:56 PM ? ? Modules accepted: Orders ? ?

## 2021-08-09 NOTE — Progress Notes (Signed)
EPIC Encounter for ICM Monitoring ? ?Patient Name: Loretta Thomas is a 71 y.o. female ?Date: 08/09/2021 ?Primary Care Physican: Rossie Muskrat, DO ?Primary Cardiologist: [place] ?Electrophysiologist: [place] ?Bi-V Pacing:   >99%       ?06/26/2021 Weight: 191 lbs ?07/04/2021 Weight: 191 lbs ?08/09/2021 Weight: 193 lbs. ?  ?AT/AF Burden: <1% ?                                             ?  ?Spoke with patient and heart failure questions reviewed.  Pt reports slight swelling in feet.  ?  ?Corvue thoracic impedance suggesting fluid levels returned to normal.   ?  ?Prescribed:  ?Furosemide 40 mg take 1 tablet every other day.  She self adjusts Furosemide as needed.   ?Spironolactone 25 mg take 0.5 tablet (12.5 mg total) by mouth daily ?Farxiga '10mg'$  take 1 tablet by mouth daily  ?  ?Labs: ?06/21/2021 Creatinine 0.74, BUN 10, Potassium 4.1, Sodium 139, GFR >60 ?A complete set of results can be found in Results Review. ?  ?Recommendations:  Recommendation to limit salt intake to 2000 mg daily and fluid intake to 64 oz daily.  Encouraged to call if experiencing any fluid symptoms.  ?  ?Follow-up plan: ICM clinic phone appointment on 09/04/2021.   91 day device clinic remote transmission 10/03/2021.   ?  ?EP/Cardiology Office Visits:  Recall 02/04/2022 with Oda Kilts, PA.  Recall 12/13/2021 with Dr Radford Pax.  Recall 12/13/2021 with Dr Haroldine Laws.  ?  ?Copy of ICM check sent to Dr. Rayann Heman. ? ?3 month ICM trend: 08/08/2021. ? ? ? ?12-14 Month ICM trend:  ? ? ? ?[name], RN ?08/09/2021 ?10:24 AM ? ?

## 2021-09-05 ENCOUNTER — Ambulatory Visit (INDEPENDENT_AMBULATORY_CARE_PROVIDER_SITE_OTHER): Payer: Federal, State, Local not specified - PPO

## 2021-09-05 DIAGNOSIS — I5022 Chronic systolic (congestive) heart failure: Secondary | ICD-10-CM

## 2021-09-05 DIAGNOSIS — Z95810 Presence of automatic (implantable) cardiac defibrillator: Secondary | ICD-10-CM | POA: Diagnosis not present

## 2021-09-05 NOTE — Progress Notes (Signed)
EPIC Encounter for ICM Monitoring  Patient Name: Loretta Thomas is a 71 y.o. female Date: 09/05/2021 Primary Care Physican: Rossie Muskrat, DO Primary Cardiologist: [place] Electrophysiologist: [place] Bi-V Pacing:   >99%       06/26/2021 Weight: 191 lbs 07/04/2021 Weight: 191 lbs 08/09/2021 Weight: 193 lbs.   AT/AF Burden: 0%                                                Spoke with patient and heart failure questions reviewed.  Pt reports swelling in fingers and stomach.    Corvue thoracic impedance suggesting possible fluid accumulation starting 5/21 and trending back toward baseline.     Prescribed:  Furosemide 40 mg take 1 tablet every other day.  She self adjusts Furosemide as needed.   Spironolactone 25 mg take 0.5 tablet (12.5 mg total) by mouth daily Farxiga '10mg'$  take 1 tablet by mouth daily    Labs: 06/21/2021 Creatinine 0.74, BUN 10, Potassium 4.1, Sodium 139, GFR >60 A complete set of results can be found in Results Review.   Recommendations:  She took extra Furosemide yesterday and will take extra one today.   Discussed limiting salt and fluid intake.     Follow-up plan: ICM clinic phone appointment on 09/13/2021 to recheck fluid levels.   91 day device clinic remote transmission 10/03/2021.     EP/Cardiology Office Visits:  Recall 02/04/2022 with Oda Kilts, PA.  Recall 12/13/2021 with Dr Radford Pax.  Recall 12/13/2021 with Dr Haroldine Laws.    Copy of ICM check sent to Dr. Rayann Heman and Dr Jeffie Pollock as Juluis Rainier.  3 month ICM trend: 09/05/2021.    12-14 Month ICM trend:     [name], RN 09/05/2021 9:31 AM

## 2021-09-12 ENCOUNTER — Ambulatory Visit (INDEPENDENT_AMBULATORY_CARE_PROVIDER_SITE_OTHER): Payer: Federal, State, Local not specified - PPO

## 2021-09-12 ENCOUNTER — Telehealth: Payer: Self-pay

## 2021-09-12 DIAGNOSIS — I5022 Chronic systolic (congestive) heart failure: Secondary | ICD-10-CM

## 2021-09-12 DIAGNOSIS — Z95810 Presence of automatic (implantable) cardiac defibrillator: Secondary | ICD-10-CM

## 2021-09-12 NOTE — Progress Notes (Signed)
EPIC Encounter for ICM Monitoring  Patient Name: Loretta Thomas is a 71 y.o. female Date: 09/12/2021 Primary Care Physican: Rossie Muskrat, DO Primary Cardiologist: [place] Electrophysiologist: [place] Bi-V Pacing:   >99%       06/26/2021 Weight: 191 lbs 07/04/2021 Weight: 191 lbs 08/09/2021 Weight: 193 lbs. 09/12/2021 Weight: 190 lbs   AT/AF Burden: 0%                                                Spoke with patient and heart failure questions reviewed.  Pt reports she felt a gurgling in her chest last night and pain in her back so she was not sure if the fluid accumulation had resolved.  She said since fluid levels show normal than maybe she has something with her lungs.  She is okay but will call the physician if condition changes.   Corvue thoracic impedance suggesting fluid levels returned to normal after taking extra Furosemide.      Prescribed:  Furosemide 40 mg take 1 tablet every other day.  She self adjusts Furosemide as needed.   Spironolactone 25 mg take 0.5 tablet (12.5 mg total) by mouth daily Farxiga '10mg'$  take 1 tablet by mouth daily    Labs: 06/21/2021 Creatinine 0.74, BUN 10, Potassium 4.1, Sodium 139, GFR >60 A complete set of results can be found in Results Review.   Recommendations: No changes and encouraged to call if experiencing any fluid symptoms.   Follow-up plan: ICM clinic phone appointment on 10/09/2021.   91 day device clinic remote transmission 10/03/2021.     EP/Cardiology Office Visits:  Recall 02/04/2022 with Oda Kilts, PA.  Recall 12/13/2021 with Dr Radford Pax.  Recall 12/13/2021 with Dr Haroldine Laws.    Copy of ICM check sent to Dr. Rayann Heman.  3 month ICM trend: 09/12/2021.    12-14 Month ICM trend:     [name], RN 09/12/2021 1:54 PM

## 2021-09-12 NOTE — Telephone Encounter (Signed)
Returned call to patient as requested by voice mail message.  She wanted fluid levels to be rechecked to see if the fluid has resolved.  See ICM Note.

## 2021-09-21 ENCOUNTER — Other Ambulatory Visit (HOSPITAL_COMMUNITY): Payer: Self-pay | Admitting: Internal Medicine

## 2021-09-26 ENCOUNTER — Telehealth (HOSPITAL_COMMUNITY): Payer: Self-pay | Admitting: Pharmacy Technician

## 2021-09-26 NOTE — Telephone Encounter (Signed)
Advanced Heart Failure Patient Advocate Encounter  Prior Authorization for [facility] has been submitted and approved.    PA# [id number] Effective dates: 08/26/21 through 09/25/22  Charlann Boxer, CPhT

## 2021-10-03 ENCOUNTER — Ambulatory Visit (INDEPENDENT_AMBULATORY_CARE_PROVIDER_SITE_OTHER): Payer: Federal, State, Local not specified - PPO

## 2021-10-03 DIAGNOSIS — I428 Other cardiomyopathies: Secondary | ICD-10-CM | POA: Diagnosis not present

## 2021-10-04 LAB — CUP PACEART REMOTE DEVICE CHECK
Battery Remaining Longevity: 41 mo
Battery Remaining Percentage: 50 %
Battery Voltage: 2.93 V
Brady Statistic AP VP Percent: 1 %
Brady Statistic AP VS Percent: 0 %
Brady Statistic AS VP Percent: 99 %
Brady Statistic AS VS Percent: 1 %
Brady Statistic RA Percent Paced: 1 %
Date Time Interrogation Session: 20230621005910
HighPow Impedance: 78 Ohm
HighPow Impedance: 78 Ohm
Implantable Lead Implant Date: 20190920
Implantable Lead Implant Date: 20190920
Implantable Lead Implant Date: 20190920
Implantable Lead Location: 753858
Implantable Lead Location: 753859
Implantable Lead Location: 753860
Implantable Pulse Generator Implant Date: 20190920
Lead Channel Impedance Value: 1100 Ohm
Lead Channel Impedance Value: 400 Ohm
Lead Channel Impedance Value: 560 Ohm
Lead Channel Pacing Threshold Amplitude: 0.5 V
Lead Channel Pacing Threshold Amplitude: 0.75 V
Lead Channel Pacing Threshold Amplitude: 0.875 V
Lead Channel Pacing Threshold Pulse Width: 0.5 ms
Lead Channel Pacing Threshold Pulse Width: 0.5 ms
Lead Channel Pacing Threshold Pulse Width: 0.5 ms
Lead Channel Sensing Intrinsic Amplitude: 12 mV
Lead Channel Sensing Intrinsic Amplitude: 4.6 mV
Lead Channel Setting Pacing Amplitude: 2 V
Lead Channel Setting Pacing Amplitude: 2 V
Lead Channel Setting Pacing Amplitude: 2 V
Lead Channel Setting Pacing Pulse Width: 0.5 ms
Lead Channel Setting Pacing Pulse Width: 0.5 ms
Lead Channel Setting Sensing Sensitivity: 0.5 mV
Pulse Gen Serial Number: 9833194

## 2021-10-09 ENCOUNTER — Ambulatory Visit (INDEPENDENT_AMBULATORY_CARE_PROVIDER_SITE_OTHER): Payer: Federal, State, Local not specified - PPO

## 2021-10-09 DIAGNOSIS — Z95810 Presence of automatic (implantable) cardiac defibrillator: Secondary | ICD-10-CM | POA: Diagnosis not present

## 2021-10-09 DIAGNOSIS — I5022 Chronic systolic (congestive) heart failure: Secondary | ICD-10-CM

## 2021-10-11 NOTE — Progress Notes (Signed)
EPIC Encounter for ICM Monitoring  Patient Name: Loretta Thomas is a 71 y.o. female Date: 10/11/2021 Primary Care Physican: Rossie Muskrat, DO Primary Cardiologist: [place] Electrophysiologist: [place] Bi-V Pacing:   >99%       06/26/2021 Weight: 191 lbs 07/04/2021 Weight: 191 lbs 08/09/2021 Weight: 193 lbs. 09/12/2021 Weight: 190 lbs   AT/AF Burden: 0%                                                Spoke with patient and heart failure questions reviewed.  Pt reports being on vacation during decreased impedance.  She has 2 sisters in the hospital.     Corvue thoracic impedance suggesting possible fluid accumulation from 6/14-6/21 and possible dryness starting 6/23.      Prescribed:  Furosemide 40 mg take 1 tablet every other day.  She self adjusts Furosemide as needed.   Spironolactone 25 mg take 0.5 tablet (12.5 mg total) by mouth daily Farxiga '10mg'$  take 1 tablet by mouth daily    Labs: 06/21/2021 Creatinine 0.74, BUN 10, Potassium 4.1, Sodium 139, GFR >60 A complete set of results can be found in Results Review.   Recommendations:  Recommended to drink 64 oz daily to stay hydrated.     Follow-up plan: ICM clinic phone appointment on 10/16/2021 to recheck fluid levels.   91 day device clinic remote transmission 01/02/2022.     EP/Cardiology Office Visits:  Recall 02/04/2022 with Oda Kilts, PA.  Recall 12/13/2021 with Dr Radford Pax.  Recall 12/13/2021 with Dr Haroldine Laws.    Copy of ICM check sent to Dr. Rayann Heman.   3 month ICM trend: 10/10/2021.    12-14 Month ICM trend:     [name], RN 10/11/2021 3:52 PM

## 2021-10-16 ENCOUNTER — Telehealth: Payer: Self-pay

## 2021-10-16 ENCOUNTER — Ambulatory Visit (INDEPENDENT_AMBULATORY_CARE_PROVIDER_SITE_OTHER): Payer: Federal, State, Local not specified - PPO

## 2021-10-16 DIAGNOSIS — I5022 Chronic systolic (congestive) heart failure: Secondary | ICD-10-CM

## 2021-10-16 DIAGNOSIS — Z95810 Presence of automatic (implantable) cardiac defibrillator: Secondary | ICD-10-CM

## 2021-10-16 NOTE — Telephone Encounter (Signed)
Remote ICM transmission received.  Attempted call to patient regarding ICM remote transmission and left message to return call   

## 2021-10-16 NOTE — Progress Notes (Signed)
EPIC Encounter for ICM Monitoring  Patient Name: Loretta Thomas is a 71 y.o. female Date: 10/16/2021 Primary Care Physican: Rossie Muskrat, DO Primary Cardiologist: [place] Electrophysiologist: [place] Bi-V Pacing:   >99%       06/26/2021 Weight: 191 lbs 07/04/2021 Weight: 191 lbs 08/09/2021 Weight: 193 lbs. 09/12/2021 Weight: 190 lbs   AT/AF Burden: 0%                                                Attempted call to patient and unable to reach.   Transmission reviewed.    Corvue thoracic impedance suggesting fluid levels returned to normal.      Prescribed:  Furosemide 40 mg take 1 tablet every other day.  She self adjusts Furosemide as needed.   Spironolactone 25 mg take 0.5 tablet (12.5 mg total) by mouth daily Farxiga '10mg'$  take 1 tablet by mouth daily    Labs: 06/21/2021 Creatinine 0.74, BUN 10, Potassium 4.1, Sodium 139, GFR >60 A complete set of results can be found in Results Review.   Recommendations:  Unable to reach.       Follow-up plan: ICM clinic phone appointment on 11/13/2021.   91 day device clinic remote transmission 01/02/2022.     EP/Cardiology Office Visits:  Recall 02/04/2022 with Oda Kilts, PA.  Recall 12/13/2021 with Dr Radford Pax.  Recall 12/13/2021 with Dr Haroldine Laws.    Copy of ICM check sent to Dr. Rayann Heman.  3 month ICM trend: 10/16/2021.    12-14 Month ICM trend:     [name], RN 10/16/2021 3:35 PM

## 2021-10-19 NOTE — Progress Notes (Signed)
Remote ICD transmission.   

## 2021-11-08 ENCOUNTER — Telehealth (HOSPITAL_COMMUNITY): Payer: Self-pay | Admitting: *Deleted

## 2021-11-08 NOTE — Telephone Encounter (Signed)
Pt left vm asking if it was ok for her to have a colonoscopy.    Routed to [place]

## 2021-11-08 NOTE — Telephone Encounter (Signed)
Left vm for return call

## 2021-11-13 ENCOUNTER — Ambulatory Visit (INDEPENDENT_AMBULATORY_CARE_PROVIDER_SITE_OTHER): Payer: Federal, State, Local not specified - PPO

## 2021-11-13 DIAGNOSIS — I5022 Chronic systolic (congestive) heart failure: Secondary | ICD-10-CM

## 2021-11-13 DIAGNOSIS — Z95810 Presence of automatic (implantable) cardiac defibrillator: Secondary | ICD-10-CM | POA: Diagnosis not present

## 2021-11-16 NOTE — Progress Notes (Signed)
EPIC Encounter for ICM Monitoring  Patient Name: Loretta Thomas is a 71 y.o. female Date: 11/16/2021 Primary Care Physican: Rossie Muskrat, DO Primary Cardiologist: [place] Electrophysiologist: [place] Bi-V Pacing:   >99%       08/09/2021 Weight: 193 lbs. 09/12/2021 Weight: 190 lbs 11/16/2021 Weight: 190 lbs   AT/AF Burden: 0%                                              Spoke with patient and heart failure questions reviewed.  Pt asymptomatic for fluid accumulation.  Reports feeling well at this time and voices no complaints.  She was on vacation during decreased impedance.    Corvue thoracic impedance normal but was suggesting possible fluid accumulation from 7/18-7/28.      Prescribed:  Furosemide 40 mg take 1 tablet every other day.  She self adjusts Furosemide as needed.   Spironolactone 25 mg take 0.5 tablet (12.5 mg total) by mouth daily Farxiga '10mg'$  take 1 tablet by mouth daily    Labs: 06/21/2021 Creatinine 0.74, BUN 10, Potassium 4.1, Sodium 139, GFR >60 A complete set of results can be found in Results Review.   Recommendations:  No changes and encouraged to call if experiencing any fluid symptoms.   Follow-up plan: ICM clinic phone appointment on 12/19/2021.   91 day device clinic remote transmission 01/02/2022.     EP/Cardiology Office Visits:  Recall 02/04/2022 with Oda Kilts, PA.  Recall 12/13/2021 with Dr Radford Pax.  Recall 12/13/2021 with Dr Haroldine Laws.    Copy of ICM check sent to Dr. Rayann Heman.  3 month ICM trend: 11/13/2021.    12-14 Month ICM trend:     [name], RN 11/16/2021 9:08 AM

## 2021-12-12 ENCOUNTER — Telehealth: Payer: Self-pay

## 2021-12-12 NOTE — Telephone Encounter (Signed)
Returned call to patient as requested by voice mail message.  Pt reports she thinks she may have fluid because she feels congested.  She would like to send report to look at fluid levels.  Advised to send remote transmission and will call back with results.

## 2021-12-12 NOTE — Telephone Encounter (Signed)
Returned call to patient and transmission reviewed.  Advised fluid levels are norma but was suggesting fluid accumulation from 8/24-8/23.  She thinks it must be sinus drainage and glad to know if is not fluid overload.  She appreciated the return call.

## 2021-12-19 ENCOUNTER — Ambulatory Visit (INDEPENDENT_AMBULATORY_CARE_PROVIDER_SITE_OTHER): Payer: Federal, State, Local not specified - PPO

## 2021-12-19 DIAGNOSIS — Z95810 Presence of automatic (implantable) cardiac defibrillator: Secondary | ICD-10-CM | POA: Diagnosis not present

## 2021-12-19 DIAGNOSIS — I5022 Chronic systolic (congestive) heart failure: Secondary | ICD-10-CM

## 2021-12-20 ENCOUNTER — Telehealth: Payer: Self-pay

## 2021-12-20 NOTE — Telephone Encounter (Signed)
Remote ICM transmission received.  Attempted call to patient regarding ICM remote transmission and left detailed message per DPR.  Advised to return call for any fluid symptoms or questions. Next ICM remote transmission scheduled 01/29/2022.

## 2021-12-20 NOTE — Progress Notes (Signed)
EPIC Encounter for ICM Monitoring  Patient Name: Loretta Thomas is a 71 y.o. female Date: 12/20/2021 Primary Care Physican: Rossie Muskrat, DO Primary Cardiologist: [place] Electrophysiologist: [place] Bi-V Pacing:   >99%       08/09/2021 Weight: 193 lbs. 09/12/2021 Weight: 190 lbs 11/16/2021 Weight: 190 lbs   AT/AF Burden: 0%                                              Attempted call to patient and unable to reach.  Left detailed message per DPR regarding transmission. Transmission reviewed.     Corvue thoracic impedance normal.      Prescribed:  Furosemide 40 mg take 1 tablet every other day.  She self adjusts Furosemide as needed.   Spironolactone 25 mg take 0.5 tablet (12.5 mg total) by mouth daily Farxiga '10mg'$  take 1 tablet by mouth daily    Labs: 06/21/2021 Creatinine 0.74, BUN 10, Potassium 4.1, Sodium 139, GFR >60 A complete set of results can be found in Results Review.   Recommendations:  Left voice mail with ICM number and encouraged to call if experiencing any fluid symptoms.   Follow-up plan: ICM clinic phone appointment on 01/29/2022.   91 day device clinic remote transmission 01/02/2022.     EP/Cardiology Office Visits:  Recall 02/04/2022 with Oda Kilts, PA.  Recall 12/13/2021 with Dr Radford Pax.  Recall 12/13/2021 with Dr Haroldine Laws.    Copy of ICM check sent to Dr Myles Gip (replacing Dr. Rayann Heman).  3 month ICM trend: 12/19/2021.    12-14 Month ICM trend:     [name], RN 12/20/2021 3:28 PM

## 2021-12-21 NOTE — Progress Notes (Signed)
Spoke with patient and heart failure questions reviewed.  Pt reports she has some congestion and has respiratory infection and taking antibiotics.  She is feeling better but has a lot of stress taking care of family members.  Transmission results reviewed.  No changes and encouraged to call if experiencing any fluid symptoms.

## 2022-01-02 ENCOUNTER — Ambulatory Visit (INDEPENDENT_AMBULATORY_CARE_PROVIDER_SITE_OTHER): Payer: Federal, State, Local not specified - PPO

## 2022-01-02 ENCOUNTER — Telehealth: Payer: Self-pay

## 2022-01-02 DIAGNOSIS — I428 Other cardiomyopathies: Secondary | ICD-10-CM

## 2022-01-02 LAB — CUP PACEART REMOTE DEVICE CHECK
Battery Remaining Longevity: 38 mo
Battery Remaining Percentage: 47 %
Battery Voltage: 2.93 V
Brady Statistic AP VP Percent: 1 %
Brady Statistic AP VS Percent: 1 %
Brady Statistic AS VP Percent: 99 %
Brady Statistic AS VS Percent: 1 %
Brady Statistic RA Percent Paced: 1 %
Date Time Interrogation Session: 20230919020023
HighPow Impedance: 84 Ohm
HighPow Impedance: 84 Ohm
Implantable Lead Implant Date: 20190920
Implantable Lead Implant Date: 20190920
Implantable Lead Implant Date: 20190920
Implantable Lead Location: 753858
Implantable Lead Location: 753859
Implantable Lead Location: 753860
Implantable Pulse Generator Implant Date: 20190920
Lead Channel Impedance Value: 1075 Ohm
Lead Channel Impedance Value: 410 Ohm
Lead Channel Impedance Value: 590 Ohm
Lead Channel Pacing Threshold Amplitude: 0.5 V
Lead Channel Pacing Threshold Amplitude: 0.75 V
Lead Channel Pacing Threshold Amplitude: 0.875 V
Lead Channel Pacing Threshold Pulse Width: 0.5 ms
Lead Channel Pacing Threshold Pulse Width: 0.5 ms
Lead Channel Pacing Threshold Pulse Width: 0.5 ms
Lead Channel Sensing Intrinsic Amplitude: 12 mV
Lead Channel Sensing Intrinsic Amplitude: 4.2 mV
Lead Channel Setting Pacing Amplitude: 2 V
Lead Channel Setting Pacing Amplitude: 2 V
Lead Channel Setting Pacing Amplitude: 2 V
Lead Channel Setting Pacing Pulse Width: 0.5 ms
Lead Channel Setting Pacing Pulse Width: 0.5 ms
Lead Channel Setting Sensing Sensitivity: 0.5 mV
Pulse Gen Serial Number: 9833194

## 2022-01-02 NOTE — Telephone Encounter (Signed)
Pt called and asks if MD can write letter for exemption from jury duty from November 2023 through January 2024. Advised pt Dr Lindi Adie will only write exemptions for pts currently undergoing chemotherapy/recentsurgery/radiation or recently completed any of the above named treatments.Pt states she will speak with her other Drs to see if they can help, as she describes, "my nerves just can't take doing that."

## 2022-01-16 NOTE — Progress Notes (Signed)
Remote ICD transmission.   

## 2022-01-22 NOTE — Telephone Encounter (Addendum)
Reached out to patient and she states she never got set up with her cpap with [employer]. Her authorization has expired I will resubmit for a new one.

## 2022-01-29 ENCOUNTER — Ambulatory Visit (INDEPENDENT_AMBULATORY_CARE_PROVIDER_SITE_OTHER): Payer: Federal, State, Local not specified - PPO

## 2022-01-29 DIAGNOSIS — Z95810 Presence of automatic (implantable) cardiac defibrillator: Secondary | ICD-10-CM | POA: Diagnosis not present

## 2022-01-29 DIAGNOSIS — I5022 Chronic systolic (congestive) heart failure: Secondary | ICD-10-CM

## 2022-02-02 NOTE — Progress Notes (Signed)
EPIC Encounter for ICM Monitoring  Patient Name: Loretta Thomas is a 71 y.o. female Date: 02/02/2022 Primary Care Physican: Rossie Muskrat, DO Primary Cardiologist: [place] Electrophysiologist: [place] Bi-V Pacing:   >99%       08/09/2021 Weight: 193 lbs. 09/12/2021 Weight: 190 lbs 11/16/2021 Weight: 190 lbs   AT/AF Burden: 0%                                              Spoke with patient and heart failure questions reviewed.  Transmission results reviewed.  Pt asymptomatic for fluid accumulation.  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     Corvue thoracic impedance suggesting intermittent days with possible fluid accumulation alternating with possible dryness.        Prescribed:  Furosemide 40 mg take 1 tablet every other day.  She self adjusts Furosemide as needed.   Spironolactone 25 mg take 0.5 tablet (12.5 mg total) by mouth daily Farxiga '10mg'$  take 1 tablet by mouth daily    Labs: 06/21/2021 Creatinine 0.74, BUN 10, Potassium 4.1, Sodium 139, GFR >60 A complete set of results can be found in Results Review.   Recommendations:  No changes and encouraged to call if experiencing any fluid symptoms.   Follow-up plan: ICM clinic phone appointment on 03/05/2022.   91 day device clinic remote transmission 04/03/2022.     EP/Cardiology Office Visits:  Recall 02/04/2022 with Oda Kilts, PA.  Recall 12/13/2021 with Dr Radford Pax.  Recall 12/13/2021 with Dr Haroldine Laws.    Copy of ICM check sent to Dr Myles Gip.   3 month ICM trend: 01/30/2022.    12-14 Month ICM trend:     [name], RN 02/02/2022 3:44 PM

## 2022-02-05 NOTE — Addendum Note (Signed)
Addended by: Freada Bergeron on: 02/05/2022 02:57 PM   Modules accepted: Orders

## 2022-02-16 ENCOUNTER — Other Ambulatory Visit (HOSPITAL_COMMUNITY): Payer: Self-pay | Admitting: Internal Medicine

## 2022-03-05 ENCOUNTER — Ambulatory Visit (INDEPENDENT_AMBULATORY_CARE_PROVIDER_SITE_OTHER): Payer: Federal, State, Local not specified - PPO

## 2022-03-05 DIAGNOSIS — I5022 Chronic systolic (congestive) heart failure: Secondary | ICD-10-CM | POA: Diagnosis not present

## 2022-03-05 DIAGNOSIS — Z95810 Presence of automatic (implantable) cardiac defibrillator: Secondary | ICD-10-CM

## 2022-03-06 NOTE — Progress Notes (Signed)
EPIC Encounter for ICM Monitoring  Patient Name: Loretta Thomas is a 71 y.o. female Date: 03/06/2022 Primary Care Physican: Rossie Muskrat, DO Primary Cardiologist: [place] Electrophysiologist: Mealor Bi-V Pacing:   >99%       08/09/2021 Weight: 193 lbs. 09/12/2021 Weight: 190 lbs 11/16/2021 Weight: 190 lbs 03/06/2022 Weight: 193 lbs   AT/AF Burden: 0%                                              Spoke with patient and heart failure questions reviewed.  Transmission results reviewed.  Pt asymptomatic for fluid accumulation.  She reports her 1 sister died and other sister is in nursing home.  She has had some days she was not able to take Furosemide due to taking care of her sister.     Corvue thoracic impedance suggesting intermittent days with possible fluid accumulation within the last month.        Prescribed:  Furosemide 40 mg take 1 tablet every other day.  She self adjusts Furosemide as needed.   Spironolactone 25 mg take 0.5 tablet (12.5 mg total) by mouth daily Farxiga '10mg'$  take 1 tablet by mouth daily    Labs: 06/21/2021 Creatinine 0.74, BUN 10, Potassium 4.1, Sodium 139, GFR >60 A complete set of results can be found in Results Review.   Recommendations:  No changes and encouraged to call if experiencing any fluid symptoms.   Follow-up plan: ICM clinic phone appointment on 04/10/2022.   91 day device clinic remote transmission 04/03/2022.     EP/Cardiology Office Visits:  Recall 02/04/2022 with Oda Kilts, [place].  04/03/2022 with Dr Radford Pax.  04/19/2022 with Dr Haroldine Laws.    Copy of ICM check sent to Dr Myles Gip.    3 month ICM trend: 03/05/2022.    12-14 Month ICM trend:     [name], RN 03/06/2022 9:16 AM

## 2022-04-03 ENCOUNTER — Ambulatory Visit: Payer: Federal, State, Local not specified - PPO | Attending: Cardiology | Admitting: Cardiology

## 2022-04-03 ENCOUNTER — Other Ambulatory Visit: Payer: Self-pay | Admitting: Hematology and Oncology

## 2022-04-03 ENCOUNTER — Ambulatory Visit (INDEPENDENT_AMBULATORY_CARE_PROVIDER_SITE_OTHER): Payer: Federal, State, Local not specified - PPO

## 2022-04-03 DIAGNOSIS — G4733 Obstructive sleep apnea (adult) (pediatric): Secondary | ICD-10-CM

## 2022-04-03 DIAGNOSIS — I493 Ventricular premature depolarization: Secondary | ICD-10-CM | POA: Insufficient documentation

## 2022-04-03 DIAGNOSIS — Z1231 Encounter for screening mammogram for malignant neoplasm of breast: Secondary | ICD-10-CM

## 2022-04-03 DIAGNOSIS — I428 Other cardiomyopathies: Secondary | ICD-10-CM

## 2022-04-03 DIAGNOSIS — G4736 Sleep related hypoventilation in conditions classified elsewhere: Secondary | ICD-10-CM | POA: Diagnosis not present

## 2022-04-03 LAB — CUP PACEART REMOTE DEVICE CHECK
Battery Remaining Longevity: 35 mo
Battery Remaining Percentage: 43 %
Battery Voltage: 2.92 V
Brady Statistic AP VP Percent: 1 %
Brady Statistic AP VS Percent: 1 %
Brady Statistic AS VP Percent: 99 %
Brady Statistic AS VS Percent: 1 %
Brady Statistic RA Percent Paced: 1 %
Date Time Interrogation Session: 20231219020015
HighPow Impedance: 80 Ohm
HighPow Impedance: 80 Ohm
Implantable Lead Connection Status: 753985
Implantable Lead Connection Status: 753985
Implantable Lead Connection Status: 753985
Implantable Lead Implant Date: 20190920
Implantable Lead Implant Date: 20190920
Implantable Lead Implant Date: 20190920
Implantable Lead Location: 753858
Implantable Lead Location: 753859
Implantable Lead Location: 753860
Implantable Pulse Generator Implant Date: 20190920
Lead Channel Impedance Value: 1100 Ohm
Lead Channel Impedance Value: 390 Ohm
Lead Channel Impedance Value: 560 Ohm
Lead Channel Pacing Threshold Amplitude: 0.5 V
Lead Channel Pacing Threshold Amplitude: 0.75 V
Lead Channel Pacing Threshold Amplitude: 0.875 V
Lead Channel Pacing Threshold Pulse Width: 0.5 ms
Lead Channel Pacing Threshold Pulse Width: 0.5 ms
Lead Channel Pacing Threshold Pulse Width: 0.5 ms
Lead Channel Sensing Intrinsic Amplitude: 12 mV
Lead Channel Sensing Intrinsic Amplitude: 4.2 mV
Lead Channel Setting Pacing Amplitude: 2 V
Lead Channel Setting Pacing Amplitude: 2 V
Lead Channel Setting Pacing Amplitude: 2 V
Lead Channel Setting Pacing Pulse Width: 0.5 ms
Lead Channel Setting Pacing Pulse Width: 0.5 ms
Lead Channel Setting Sensing Sensitivity: 0.5 mV
Pulse Gen Serial Number: 9833194
Zone Setting Status: 755011

## 2022-04-04 NOTE — Procedures (Signed)
   Patient Name: Loretta Thomas, Pair Date:04/03/2022 Gender: Female D.O.B: 1950/11/29 Age (years): 31 Referring Provider: Fransico Him MD, ABSM Height (inches): 67 Interpreting Physician: Fransico Him MD, ABSM Weight (lbs): 194 RPSGT: Rosebud Poles BMI: 30 MRN: [id number] Neck Size: 15.00  CLINICAL INFORMATION Sleep Study Type: NPSG  Indication for sleep study: N/A  Epworth Sleepiness Score: 12  SLEEP STUDY TECHNIQUE As per the AASM Manual for the Scoring of Sleep and Associated Events v2.3 (April 2016) with a hypopnea requiring 4% desaturations.  The channels recorded and monitored were frontal, central and occipital EEG, electrooculogram (EOG), submentalis EMG (chin), nasal and oral airflow, thoracic and abdominal wall motion, anterior tibialis EMG, snore microphone, electrocardiogram, and pulse oximetry.  MEDICATIONS Medications self-administered by patient taken the night of the study : N/A  SLEEP ARCHITECTURE The study was initiated at 11:07:46 PM and ended at 6:02:15 AM.  Sleep onset time was 17.7 minutes and the sleep efficiency was 92.3%. The total sleep time was 382.7 minutes.  Stage REM latency was 197.0 minutes.  The patient spent 2.22% of the night in stage N1 sleep, 53.23% in stage N2 sleep, 24.95% in stage N3 and 19.6% in REM.  Alpha intrusion was absent.  Supine sleep was 54.54%.  RESPIRATORY PARAMETERS The overall apnea/hypopnea index (AHI) was 5.3 per hour. There were 1 total apneas, including 0 obstructive, 1 central and 0 mixed apneas. There were 33 hypopneas and 0 RERAs.  The AHI during Stage REM sleep was 15.2 per hour.  AHI while supine was 8.3 per hour.  The mean oxygen saturation was 92.09%. The minimum SpO2 during sleep was 76.00%.  moderate snoring was noted during this study.  CARDIAC DATA The 2 lead EKG demonstrated sinus rhythm. The mean heart rate was 54.13 beats per minute. Other EKG findings include: PVCs.  LEG MOVEMENT  DATA The total PLMS were 0 with a resulting PLMS index of 0.00. Associated arousal with leg movement index was 0.0 .  IMPRESSIONS - Mild obstructive sleep apnea occurred during this study (AHI = 5.3/h). - No significant central sleep apnea occurred during this study (CAI = 0.2/h). - Moderate oxygen desaturation was noted during this study (Min O2 = 76.00%). - The patient snored with moderate snoring volume. - EKG findings include PVCs. - Clinically significant periodic limb movements did not occur during sleep. No significant associated arousals.  DIAGNOSIS - Obstructive Sleep Apnea (G47.33) - Nocturnal Hypoxemia (G47.36)  RECOMMENDATIONS - Recommend CPAP titration for sleep disordered breathing and nocturnal hypoxemia.  - Positional therapy avoiding supine position during sleep. - Avoid alcohol, sedatives and other CNS depressants that may worsen sleep apnea and disrupt normal sleep architecture. - Sleep hygiene should be reviewed to assess factors that may improve sleep quality. - Weight management and regular exercise should be initiated or continued if appropriate.  [Electronically signed] 04/04/2022 11:27 AM  Fransico Him MD, ABSM Diplomate, American Board of Sleep Medicine

## 2022-04-07 ENCOUNTER — Other Ambulatory Visit (HOSPITAL_COMMUNITY): Payer: Self-pay | Admitting: Family Medicine

## 2022-04-10 ENCOUNTER — Ambulatory Visit (INDEPENDENT_AMBULATORY_CARE_PROVIDER_SITE_OTHER): Payer: Federal, State, Local not specified - PPO

## 2022-04-10 DIAGNOSIS — Z95810 Presence of automatic (implantable) cardiac defibrillator: Secondary | ICD-10-CM | POA: Diagnosis not present

## 2022-04-10 DIAGNOSIS — I5022 Chronic systolic (congestive) heart failure: Secondary | ICD-10-CM

## 2022-04-10 NOTE — Progress Notes (Signed)
EPIC Encounter for ICM Monitoring  Patient Name: Loretta Thomas is a 71 y.o. female Date: 04/10/2022 Primary Care Physican: Tempie Hoist, FNP Primary Cardiologist: [place] Electrophysiologist: Mealor Bi-V Pacing:   >99%       08/09/2021 Weight: 193 lbs. 09/12/2021 Weight: 190 lbs 11/16/2021 Weight: 190 lbs 03/06/2022 Weight: 193 lbs   AT/AF Burden: 0%                                              Spoke with patient and heart failure questions reviewed.  Transmission results reviewed.  Pt reports she is not feeling well.  She has fatigue, dizziness, and decrease urine. and been in the bed for a couple of days.  She was on a trip 12/5-12/7 and did not take her fluid pills.    Corvue thoracic impedance suggesting possible dryness starting 12/17.     Impedance was suggesting possible fluid accumulation from 12/6-12/16.   Prescribed:  Furosemide 40 mg take 1 tablet every other day.  She self adjusts Furosemide as needed.   Spironolactone 25 mg take 0.5 tablet (12.5 mg total) by mouth daily Farxiga '10mg'$  take 1 tablet by mouth daily    Labs: 06/21/2021 Creatinine 0.74, BUN 10, Potassium 4.1, Sodium 139, GFR >60 A complete set of results can be found in Results Review.   Recommendations:  She held her Furosemide tablet today and will hold tomorrows dosage.  She will increase fluid intake to 64-70 oz for 2 days.  After 2 days, drink around 64 oz daily to stay hydrated.    Follow-up plan: ICM clinic phone appointment on 04/13/2022 to recheck fluid level.   91 day device clinic remote transmission 07/03/2022.     EP/Cardiology Office Visits:  Recall 02/04/2022 with Oda Kilts, [place].   05/07/2022 with Dr Haroldine Laws.    Copy of ICM check sent to Dr Myles Gip and Dr Haroldine Laws as Juluis Rainier.     3 month ICM trend: 04/10/2022.    12-14 Month ICM trend:     [name], RN 04/10/2022 4:13 PM

## 2022-04-13 ENCOUNTER — Ambulatory Visit (INDEPENDENT_AMBULATORY_CARE_PROVIDER_SITE_OTHER): Payer: Federal, State, Local not specified - PPO

## 2022-04-13 DIAGNOSIS — I5022 Chronic systolic (congestive) heart failure: Secondary | ICD-10-CM

## 2022-04-13 DIAGNOSIS — Z95810 Presence of automatic (implantable) cardiac defibrillator: Secondary | ICD-10-CM

## 2022-04-13 NOTE — Progress Notes (Signed)
EPIC Encounter for ICM Monitoring  Patient Name: Loretta Thomas is a 71 y.o. female Date: 04/13/2022 Primary Care Physican: Tempie Hoist, FNP Primary Cardiologist: [place] Electrophysiologist: Mealor Bi-V Pacing:   >99%       08/09/2021 Weight: 193 lbs. 09/12/2021 Weight: 190 lbs 11/16/2021 Weight: 190 lbs 03/06/2022 Weight: 193 lbs   AT/AF Burden: 0%                                              Spoke with patient and heart failure questions reviewed.  Transmission results reviewed.  Pt reports she is feeling better since increasing her fluid intake.     Corvue thoracic impedance suggesting fluid levels returned to normal.   Prescribed:  Furosemide 40 mg take 1 tablet every other day.  She self adjusts Furosemide as needed.   Spironolactone 25 mg take 0.5 tablet (12.5 mg total) by mouth daily Farxiga '10mg'$  take 1 tablet by mouth daily    Labs: 06/21/2021 Creatinine 0.74, BUN 10, Potassium 4.1, Sodium 139, GFR >60 A complete set of results can be found in Results Review.   Recommendations:  Advised to drink 64 oz fluid to stay hydrated.  Encouraged to call for changes in condition.   Follow-up plan: ICM clinic phone appointment on 05/14/2022.   91 day device clinic remote transmission 07/03/2022.     EP/Cardiology Office Visits:  Recall 02/04/2022 with Oda Kilts, [place].   05/07/2022 with Dr Haroldine Laws.    Copy of ICM check sent to Dr Myles Gip and Dr Haroldine Laws as Juluis Rainier.      3 month ICM trend: 04/13/2022.    12-14 Month ICM trend:     [name], RN 04/13/2022 9:28 AM

## 2022-04-17 ENCOUNTER — Telehealth: Payer: Self-pay | Admitting: *Deleted

## 2022-04-17 DIAGNOSIS — G4733 Obstructive sleep apnea (adult) (pediatric): Secondary | ICD-10-CM

## 2022-04-17 DIAGNOSIS — I10 Essential (primary) hypertension: Secondary | ICD-10-CM

## 2022-04-17 NOTE — Telephone Encounter (Signed)
The patient has been notified of the result. Left detailed message with Ernie and asked to have the patient to call back.[name], CMA .

## 2022-04-17 NOTE — Telephone Encounter (Signed)
-----   Message from Lauralee Evener, [place] sent at 04/06/2022 10:09 AM EST -----  ----- Message ----- From: Sueanne Margarita, MD Sent: 04/04/2022  11:29 AM EST To: Cv Div Sleep Studies  Please let patient know that they have sleep apnea.  Recommend therapeutic CPAP titration for treatment of patient's sleep disordered breathing.  If unable to perform an in lab titration then initiate ResMed auto CPAP from 4 to 15cm H2O with heated humidity and mask of choice and overnight pulse ox on CPAP.

## 2022-04-18 NOTE — Telephone Encounter (Signed)
Return Call:  The patient has been notified of the result and verbalized understanding.  All questions (if any) were answered. [name], CMA 04/18/2022 90:21 AM   Will precert cpap titration

## 2022-04-19 ENCOUNTER — Other Ambulatory Visit (HOSPITAL_COMMUNITY): Payer: Federal, State, Local not specified - PPO

## 2022-04-19 ENCOUNTER — Encounter (HOSPITAL_COMMUNITY): Payer: Federal, State, Local not specified - PPO | Admitting: Internal Medicine

## 2022-04-30 NOTE — Progress Notes (Signed)
Remote ICD transmission.   

## 2022-05-07 ENCOUNTER — Ambulatory Visit (HOSPITAL_COMMUNITY)
Admission: RE | Admit: 2022-05-07 | Discharge: 2022-05-07 | Disposition: A | Discharge status: Home / Self Care | Payer: Federal, State, Local not specified - PPO | Source: Ambulatory Visit | Attending: Internal Medicine | Admitting: Internal Medicine

## 2022-05-07 ENCOUNTER — Ambulatory Visit (HOSPITAL_BASED_OUTPATIENT_CLINIC_OR_DEPARTMENT_OTHER)
Admission: RE | Admit: 2022-05-07 | Discharge: 2022-05-07 | Disposition: A | Discharge status: Home / Self Care | Payer: Federal, State, Local not specified - PPO | Source: Ambulatory Visit | Attending: Internal Medicine | Admitting: Internal Medicine

## 2022-05-07 ENCOUNTER — Encounter (HOSPITAL_COMMUNITY): Payer: Self-pay | Admitting: Internal Medicine

## 2022-05-07 VITALS — BP 136/92 | HR 84 | Wt 193.0 lb

## 2022-05-07 DIAGNOSIS — I447 Left bundle-branch block, unspecified: Secondary | ICD-10-CM | POA: Insufficient documentation

## 2022-05-07 DIAGNOSIS — Z95811 Presence of heart assist device: Secondary | ICD-10-CM | POA: Diagnosis not present

## 2022-05-07 DIAGNOSIS — I519 Heart disease, unspecified: Secondary | ICD-10-CM | POA: Diagnosis present

## 2022-05-07 DIAGNOSIS — Z853 Personal history of malignant neoplasm of breast: Secondary | ICD-10-CM | POA: Insufficient documentation

## 2022-05-07 DIAGNOSIS — E785 Hyperlipidemia, unspecified: Secondary | ICD-10-CM | POA: Insufficient documentation

## 2022-05-07 DIAGNOSIS — I10 Essential (primary) hypertension: Secondary | ICD-10-CM | POA: Diagnosis not present

## 2022-05-07 DIAGNOSIS — Z79899 Other long term (current) drug therapy: Secondary | ICD-10-CM | POA: Diagnosis not present

## 2022-05-07 DIAGNOSIS — Z9011 Acquired absence of right breast and nipple: Secondary | ICD-10-CM | POA: Diagnosis not present

## 2022-05-07 DIAGNOSIS — G4733 Obstructive sleep apnea (adult) (pediatric): Secondary | ICD-10-CM | POA: Diagnosis not present

## 2022-05-07 DIAGNOSIS — Z7984 Long term (current) use of oral hypoglycemic drugs: Secondary | ICD-10-CM | POA: Diagnosis not present

## 2022-05-07 DIAGNOSIS — Z95810 Presence of automatic (implantable) cardiac defibrillator: Secondary | ICD-10-CM | POA: Diagnosis not present

## 2022-05-07 DIAGNOSIS — I110 Hypertensive heart disease with heart failure: Secondary | ICD-10-CM | POA: Insufficient documentation

## 2022-05-07 DIAGNOSIS — I5022 Chronic systolic (congestive) heart failure: Secondary | ICD-10-CM | POA: Diagnosis not present

## 2022-05-07 DIAGNOSIS — F419 Anxiety disorder, unspecified: Secondary | ICD-10-CM | POA: Diagnosis not present

## 2022-05-07 LAB — BASIC METABOLIC PANEL
Anion gap: 11 (ref 5–15)
BUN: 10 mg/dL (ref 8–23)
CO2: 24 mmol/L (ref 22–32)
Calcium: 9.2 mg/dL (ref 8.9–10.3)
Chloride: 106 mmol/L (ref 98–111)
Creatinine, Ser: 0.79 mg/dL (ref 0.44–1.00)
GFR, Estimated: >60 mL/min (ref >60)
Glucose, Bld: 108 mg/dL — ABNORMAL HIGH (ref 70–99)
Potassium: 3.9 mmol/L (ref 3.5–5.1)
Sodium: 141 mmol/L (ref 135–145)

## 2022-05-07 LAB — ECHOCARDIOGRAM COMPLETE
Area-P 1/2: 4.39 cm2
S' Lateral: 3 cm
Single Plane A4C EF: 52.4 %

## 2022-05-07 LAB — BRAIN NATRIURETIC PEPTIDE: B Natriuretic Peptide: 13.5 pg/mL (ref 0.0–100.0)

## 2022-05-07 NOTE — Progress Notes (Signed)
  Echocardiogram 2D Echocardiogram has been performed.  Loretta Thomas 05/07/2022, 2:17 PM

## 2022-05-07 NOTE — Patient Instructions (Signed)
It was great to see you today! No medication changes are needed at this time.  Labs today We will only contact you if something comes back abnormal or we need to make some changes. Otherwise no news is good news!  Your physician wants you to follow-up in: 9 months  in the Advanced Practitioners (PA/NP) [place] will receive a reminder letter in the mail two months in advance. If you don't receive a letter, please call our office to schedule the follow-up appointment.   Do the following things EVERYDAY: Weigh yourself in the morning before breakfast. Write it down and keep it in a log. Take your medicines as prescribed Eat low salt foods--Limit salt (sodium) to 2000 mg per day.  Stay as active as you can everyday Limit all fluids for the day to less than 2 liters   At the [facility], you and your health needs are our priority. As part of our continuing mission to provide you with exceptional heart care, we have created designated Provider Care Teams. These Care Teams include your primary Cardiologist (physician) and Advanced Practice Providers (APPs- Physician Assistants and Nurse Practitioners) who all work together to provide you with the care you need, when you need it.   You may see any of the following providers on your designated Care Team at your next follow up: Dr Glori Bickers Dr Loralie Champagne Dr. Roxana Hires, NP Lyda Jester, [place] [facility] [place], [place] Forestine Na, NP Audry Riles, PharmD   Please be sure to bring in all your medications bottles to every appointment.

## 2022-05-07 NOTE — Progress Notes (Signed)
ADVANCED HF CLINIC  NOTE    Primary Cardiologist: Jacinta Shoe  HPI: [name] is a 72 y.o. female with severe HTN, anxiety, LBBB and systolic HF with EF 36-64%. She has had multiple heart catheterizations 2002 [facility]), 2011 [facility]), 2013 [facility]). All showed normal or minimal CAD.  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 No chemoRx or XRT. 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  Developed LV dysfunction in 2019 (I cannot find echo from before them either in our system or at [facility]).    Echo 7/19 revealed EF 15%, mild LVH, global HK, moderate MR. Underwent BivICD in 9/19.   Echo 1/21 EF 20-25% with normal RV and underwent AV optimization  Had PYP scan 07/13/19 read as strongly suggestive of TTR amyloidosis  I saw  -> her and felt like scan was negative for TTR.   She developed a rash with Entresto  Echo 4/21 EF 25-30% Sleep study 4/21 AHI 7.1 Echo 12/21 EF 25-30%.  Echo 11/09/2020 LVEF 30-35%, Grade 1 DD  Echo today 05/07/22 EF 50-55% G1 DD  Today she returns for HF follow up. Feeling some better. Still SOB and fatigued. No edema, orthopnea or PND. Compliant with meds.   Past Medical History:  Diagnosis Date   Anxiety    Bladder cystocele    Breast cancer [facility])    s/p L breast cancer removal   Bundle branch block, left    diagnosed 03/2012 followed by cath showing only minimal CAD   Bursitis    Cancer ([place])    right breast   Capsulitis    CHF (congestive heart failure) ([place])    Diverticulosis    Elevated liver enzymes 2007   History of IBS    Hyperlipidemia    Hypertension    Nonischemic cardiomyopathy (H[facility])    pernicious anemia     Current Outpatient Medications  Medication Sig Dispense Refill   acetaminophen (TYLENOL) 500 MG tablet Take 500 mg by mouth every 6 (six) hours as needed.      ALPRAZolam (XANAX) 0.25 MG tablet Take 1 tablet (0.25 mg total)  by mouth 2 (two) times daily as needed for anxiety. 30 tablet 0   Calcium Citrate-Vitamin D (CALCIUM CITRATE +D PO) Take 1 tablet by mouth every other day.     carvedilol (COREG) 3.125 MG tablet TAKE 1 TABLET (3.125 MG TOTAL) BY MOUTH 2 (TWO) TIMES DAILY WITH A MEAL. 180 tablet 2   dapagliflozin propanediol (FARXIGA) 10 MG TABS tablet Take 1 tablet (10 mg total) by mouth daily before breakfast. 30 tablet 11   fluticasone (FLONASE SENSIMIST) 27.5 MCG/SPRAY nasal spray 27.5 sprays as needed.     furosemide (LASIX) 40 MG tablet TAKE 1 TABLET BY MOUTH EVERY OTHER DAY 45 tablet 3   losartan (COZAAR) 100 MG tablet TAKE 1 TABLET BY MOUTH EVERY DAY 90 tablet 3   Multiple Vitamin (MULTIVITAMIN WITH MINERALS) TABS tablet Take 1 tablet by mouth daily.     rosuvastatin (CRESTOR) 5 MG tablet Take 1 tablet (5 mg total) by mouth daily. 90 tablet 3   spironolactone (ALDACTONE) 25 MG tablet TAKE 1 TABLET BY MOUTH EVERY DAY 90 tablet 3   venlafaxine XR (EFFEXOR-XR) 75 MG 24 hr capsule Take 1 capsule (75 mg total) by mouth daily with breakfast. 90 capsule 3   Vitamin D, Ergocalciferol, (DRISDOL) 50000 UNITS CAPS capsule Take 50,000 Units by mouth every 7 (seven)  days. Fridays     Biotin 5000 MCG CAPS Take 1 capsule by mouth daily in the afternoon. (Patient not taking: Reported on 05/07/2022)     Calcium Citrate-Vitamin D (CITRACAL + D PO) Take 1,200 mg by mouth daily. (Patient not taking: Reported on 05/07/2022)     Coenzyme Q10 (CO Q10) 100 MG CAPS Take by mouth daily. (Patient not taking: Reported on 05/07/2022)     CRANBERRY CONCENTRATE PO Take 1 tablet by mouth daily. (Patient not taking: Reported on 05/07/2022)     cyanocobalamin (,VITAMIN B-12,) 1000 MCG/ML injection Inject 1 mL (1,000 mcg total) into the muscle every 30 (thirty) days. (Patient not taking: Reported on 05/07/2022) 10 mL 0   No current facility-administered medications for this encounter.    Allergies  Allergen Reactions   Benzocaine Hives    Codeine     Stomach Pains   Demerol [Meperidine]     Upset stomach   Hydrocodone    Lortab [Hydrocodone-Acetaminophen]     Upset stomach   Mangifera Indica       Social History   Socioeconomic History   Marital status: Married    Spouse name: Not on file   Number of children: Not on file   Years of education: Not on file   Highest education level: Not on file  Occupational History   Not on file  Tobacco Use   Smoking status: Never   Smokeless tobacco: Never  Vaping Use   Vaping Use: Never used  Substance and Sexual Activity   Alcohol use: No   Drug use: No   Sexual activity: Yes  Other Topics Concern   Not on file  Social History Narrative   Lives in [place]   Social Determinants of Health   Financial Resource Strain: Not on file  Food Insecurity: Not on file  Transportation Needs: Not on file  Physical Activity: Not on file  Stress: Not on file  Social Connections: Not on file  Intimate Partner Violence: Not on file      Family History  Problem Relation Age of Onset   Cancer Sister        skin and colon x2   Cancer Brother        esophageal   Cancer Father 84       mouth   Cancer Maternal Aunt 55       breast   Breast cancer Maternal Aunt 55   Cancer Maternal Grandmother 73       ovarian   Cancer Cousin 64       mat 1st cousin with breast   Breast cancer Cousin 58   Cancer Paternal Grandfather 83       lung    Vitals:   05/07/22 1418  BP: (!) 136/92  Pulse: 84  SpO2: 98%  Weight: 87.5 kg (193 lb)    Wt Readings from Last 3 Encounters:  05/07/22 87.5 kg (193 lb)  08/08/21 87.5 kg (193 lb)  08/07/21 87.8 kg (193 lb 9.6 oz)     PHYSICAL EXAM: General:  Well appearing. No resp difficulty HEENT: normal Neck: supple. no JVD. Carotids 2+ bilat; no bruits. No lymphadenopathy or thryomegaly appreciated. Cor: PMI nondisplaced. Regular rate & rhythm. No rubs, gallops or murmurs. Lungs: clear Abdomen: soft, nontender, nondistended. No  hepatosplenomegaly. No bruits or masses. Good bowel sounds. Extremities: no cyanosis, clubbing, rash, edema Neuro: alert & orientedx3, cranial nerves grossly intact. moves all 4 extremities w/o difficulty. Affect pleasant  ASSESSMENT & PLAN:  1. Chronic systolic HF  - due to presumed NICM - multiple cardiac caths with no CAD (last 2013) - EF 15% in 7/19 felt due to LBBB - s/p St Jude CRT-D in 9/19 - Echo 1/21 EF 25%. 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 SPEP negative (I though it was negative) - ECHO 12/21 EF 25-30%.  - Echo 11/09/2020 LVEF 30-35%, Grade 1 DD - Echo today 05/07/22 EF 50-55% G1 DD -> EF recovered with CRT and GDMT - Improved NYHA II-III - Volume status ok - Continue losartan 100 daily   (rash with Entresto) - Continue carvedilol 3.125 mg twice a day. Intolerant increased due to fatigue.  - Continue spiro 12.5 mg daily. - Continue carvedilol 3.125 bid - Continue Farxiga 10 mg daily.  - Continue lasix '40mg'$  qod - ICD interrogated personally. No VT/AF. BIvpacing 99% Volume ok Personally reviewed - Feel PYP scan is equivocal and based on echo findings (completely normal RV) I do not think she has cardiac amyloid or at least not severe enough to cause this degree of LV dysfunction. She has undergone AV optimization - Myeloma panel negative. Genetic testing negative  - Previously discussed Barostim. Insurance wouldn't cover  2. LBBB - s/p CRT-D - ICD interrogated as above  3. OSA - repeat sleep study 12/23 + for OSA - awaiting CPAP.  - Follows with Dr. Sebastian Ache  4. Breast Cancer - treated with surgery and letrazole (no chemo or XRT)  5. Hyperlipdemia  - continue statin. Myalgias improved with CoQ 10 - no change today - followed by PCP  Glori Bickers, MD  2:47 PM

## 2022-05-11 ENCOUNTER — Telehealth: Payer: Self-pay

## 2022-05-11 ENCOUNTER — Ambulatory Visit: Payer: Federal, State, Local not specified - PPO

## 2022-05-11 DIAGNOSIS — Z95810 Presence of automatic (implantable) cardiac defibrillator: Secondary | ICD-10-CM

## 2022-05-11 DIAGNOSIS — I519 Heart disease, unspecified: Secondary | ICD-10-CM

## 2022-05-11 NOTE — Progress Notes (Signed)
EPIC Encounter for ICM Monitoring  Patient Name: Loretta Thomas is a 72 y.o. female Date: 05/11/2022 Primary Care Physican: Tempie Hoist, FNP Primary Cardiologist: [place] Electrophysiologist: Mealor Bi-V Pacing:   >99%       08/09/2021 Weight: 193 lbs. 09/12/2021 Weight: 190 lbs 11/16/2021 Weight: 190 lbs 03/06/2022 Weight: 193 lbs 05/07/2022 Weight: 193 lbs 05/10/2022 Weight: 198 lbs    AT/AF Burden: 0%                                              Spoke with patient and heart failure questions reviewed.  Transmission results reviewed.  Pt reports has swelling in legs, weight gain of 5-6 lbs and dry hacking cough.  She has fluid in ear and been dizzy causing her to fall.    Diet:  Does not follow low salt diet.  She has been eating a lot of fasts foods and high salty snacks in the past week and advised this will contribute to possible fluid accumulation.    Corvue thoracic impedance suggesting possible fluid accumulation starting 1/22.   Prescribed:  Furosemide 40 mg take 1 tablet every other day.  She self adjusts Furosemide as needed.   Spironolactone 25 mg take 0.5 tablet (12.5 mg total) by mouth daily Farxiga '10mg'$  take 1 tablet by mouth daily    Labs: 06/21/2021 Creatinine 0.74, BUN 10, Potassium 4.1, Sodium 139, GFR >60 A complete set of results can be found in Results Review.   Recommendations:  She will adjust Furosemide to take 60 mg for 2 days then return to 40 mg every other day.    Follow-up plan: ICM clinic phone appointment on 05/14/2022.   91 day device clinic remote transmission 07/03/2022.     EP/Cardiology Office Visits:  Recall 02/04/2022 with Oda Kilts, [place].   05/07/2022 with Dr Haroldine Laws.    Copy of ICM check sent to Dr Myles Gip and Dr Haroldine Laws as Juluis Rainier.       3 month ICM trend: 05/11/2022.    12-14 Month ICM trend:     [name], RN 05/11/2022 10:03 AM

## 2022-05-11 NOTE — Telephone Encounter (Signed)
Returned call to patient per voice mail requesting stating she would like to send remote transmission for review because has gained 5 lbs overnight.  Advised to send remote transmission and will back back after reviewing.

## 2022-05-14 ENCOUNTER — Ambulatory Visit: Payer: Federal, State, Local not specified - PPO | Attending: Cardiovascular Disease

## 2022-05-14 DIAGNOSIS — I519 Heart disease, unspecified: Secondary | ICD-10-CM

## 2022-05-14 DIAGNOSIS — Z95810 Presence of automatic (implantable) cardiac defibrillator: Secondary | ICD-10-CM | POA: Diagnosis not present

## 2022-05-14 NOTE — Progress Notes (Signed)
EPIC Encounter for ICM Monitoring  Patient Name: Loretta Thomas is a 72 y.o. female Date: 05/14/2022 Primary Care Physican: Tempie Hoist, FNP Primary Cardiologist: [place] Electrophysiologist: Mealor Bi-V Pacing:   >99%       08/09/2021 Weight: 193 lbs. 09/12/2021 Weight: 190 lbs 11/16/2021 Weight: 190 lbs 03/06/2022 Weight: 193 lbs 05/07/2022 Weight: 193 lbs 05/10/2022 Weight: 198 lbs 05/14/2022 Weight: 190 lbs   AT/AF Burden: 0%                                              Spoke with patient and heart failure questions reviewed.  Transmission results reviewed.  Pt reports fluid symptoms of SOB, weight gain and swelling has resolved.    Diet: She has been reading food labels and improved with limiting salt in her diet this past week.    Corvue thoracic impedance suggesting fluid levels returned to normal after taking Furosemide 60 mg x 2 days.   Prescribed:  Furosemide 40 mg take 1 tablet every other day.  She self adjusts Furosemide as needed.   Spironolactone 25 mg take 0.5 tablet (12.5 mg total) by mouth daily Farxiga '10mg'$  take 1 tablet by mouth daily    Labs: 06/21/2021 Creatinine 0.74, BUN 10, Potassium 4.1, Sodium 139, GFR >60 A complete set of results can be found in Results Review.   Recommendations:  No changes and encouraged to call if experiencing any fluid symptoms.   Follow-up plan: ICM clinic phone appointment on 06/18/2022.   91 day device clinic remote transmission 07/03/2022.     EP/Cardiology Office Visits:  Recall 02/04/2022 with Oda Kilts, [place].   Recall 02/01/2023 with Dr Haroldine Laws.    Copy of ICM check sent to Dr Myles Gip.  3 month ICM trend: 05/14/2022.    12-14 Month ICM trend:     [name], RN 05/14/2022 2:02 PM

## 2022-06-18 ENCOUNTER — Ambulatory Visit: Payer: Federal, State, Local not specified - PPO | Attending: Cardiovascular Disease

## 2022-06-18 DIAGNOSIS — I519 Heart disease, unspecified: Secondary | ICD-10-CM

## 2022-06-18 DIAGNOSIS — I5022 Chronic systolic (congestive) heart failure: Secondary | ICD-10-CM

## 2022-06-18 DIAGNOSIS — Z95810 Presence of automatic (implantable) cardiac defibrillator: Secondary | ICD-10-CM | POA: Diagnosis not present

## 2022-06-18 NOTE — Progress Notes (Signed)
EPIC Encounter for ICM Monitoring  Patient Name: Loretta Thomas is a 72 y.o. female Date: 06/18/2022 Primary Care Physican: Tempie Hoist, FNP Primary Cardiologist: [place] Electrophysiologist: Mealor Bi-V Pacing:   >99%       08/09/2021 Weight: 193 lbs. 09/12/2021 Weight: 190 lbs 11/16/2021 Weight: 190 lbs 03/06/2022 Weight: 193 lbs 05/07/2022 Weight: 193 lbs 05/10/2022 Weight: 198 lbs    AT/AF Burden: <1%                                              Spoke with patient and heart failure questions reviewed.  Transmission results reviewed.  Pt asymptomatic for fluid accumulation.  Reports feeling well at this time and voices no complaints.   She reports her son is sick and    Diet:  Does not follow low salt diet.  She has been eating a lot of fasts foods and high salty snacks in the past week and advised this will contribute to possible fluid accumulation.     Corvue thoracic impedance suggesting possible fluid accumulation starting 2/28.  Also suggesting possible dryness from 1/28-2/8 followed by possible fluid accumulation from 2/9-2/18.   Prescribed:  Furosemide 40 mg take 1 tablet every other day.  She self adjusts Furosemide as needed.   Spironolactone 25 mg take 0.5 tablet (12.5 mg total) by mouth daily Farxiga '10mg'$  take 1 tablet by mouth daily    Labs: 06/21/2021 Creatinine 0.74, BUN 10, Potassium 4.1, Sodium 139, GFR >60 A complete set of results can be found in Results Review.   Recommendations:  Advised to take extra Furosemide 40 mg 1 tablet on the day of which will make 3 consecutive days.  After the extra tablet return to every other day.     Follow-up plan: ICM clinic phone appointment on 06/25/2022 to recheck fluid levels.   91 day device clinic remote transmission 07/03/2022.     EP/Cardiology Office Visits:  Recall 02/04/2022 with Oda Kilts, PA.   Recall 02/01/2023 with Dr Haroldine Laws.    Copy of ICM check sent to Dr Myles Gip and Dr Haroldine Laws as Juluis Rainier.       3 month  ICM trend: 06/18/2022.    12-14 Month ICM trend:     [name], RN 06/18/2022 10:40 AM

## 2022-06-22 NOTE — Progress Notes (Signed)
Patient Care Team: Tempie Hoist, FNP as PCP - General (Family Medicine) Thompson Grayer, MD (Inactive) as PCP - Electrophysiology (Cardiology) Sueanne Margarita, MD as PCP - Sleep Medicine (Cardiology) Bensimhon, Shaune Pascal, MD as PCP - Advanced Heart Failure (Cardiology) Rossie Muskrat, DO as Referring Physician (Family Medicine)  DIAGNOSIS: No diagnosis found.  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% PR 30% Ki-67 40% HER-2 negative ratio 1.14; Grade 3; second biopsy from subareolar area done 11/16/2013 revealed DCIS with calcifications   12/29/2013 Procedure   BRCA 1 and 2 Negative: 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 (32-58% lifetime risk of breast cancer and increased lifetime risk of pancreatic cancer):RAD51D variant of unknown significance: c.[id number]>G   03/09/2014 Procedure   Oncotype DX recurrence score 24, 16% risk of recurrence intermediate risk, patient offered chemotherapy   03/22/2014 -  Anti-estrogen oral therapy   Arimidex 1 mg daily switched to letrozole 07/26/2016 due to muscle aches and pains     CHIEF COMPLIANT:   INTERVAL HISTORY: Loretta Thomas is a   ALLERGIES:  is allergic to benzocaine, codeine, demerol [meperidine], hydrocodone, lortab [hydrocodone-acetaminophen], and mangifera indica.  MEDICATIONS:  Current Outpatient Medications  Medication Sig Dispense Refill   acetaminophen (TYLENOL) 500 MG tablet Take 500 mg by mouth every 6 (six) hours as needed.       ALPRAZolam (XANAX) 0.25 MG tablet Take 1 tablet (0.25 mg total) by mouth 2 (two) times daily as needed for anxiety. 30 tablet 0   Biotin 5000 MCG CAPS Take 1 capsule by mouth daily in the afternoon. (Patient not taking: Reported on 05/07/2022)     Calcium Citrate-Vitamin D (CALCIUM CITRATE +D PO) Take 1 tablet by mouth every other day.     Calcium Citrate-Vitamin D (CITRACAL + D PO) Take 1,200 mg by mouth daily. (Patient not taking: Reported on 05/07/2022)     carvedilol (COREG) 3.125 MG tablet TAKE 1 TABLET (3.125 MG TOTAL) BY MOUTH 2 (TWO) TIMES DAILY WITH A MEAL. 180 tablet 2   Coenzyme Q10 (CO Q10) 100 MG CAPS Take by mouth daily. (Patient not taking: Reported on 05/07/2022)     CRANBERRY CONCENTRATE PO Take 1 tablet by mouth daily. (Patient not taking: Reported on 05/07/2022)     cyanocobalamin (,VITAMIN B-12,) 1000 MCG/ML injection Inject 1 mL (1,000 mcg total) into the muscle every 30 (thirty) days. (Patient not taking: Reported on 05/07/2022) 10 mL 0   dapagliflozin propanediol (FARXIGA) 10 MG TABS tablet Take 1 tablet (10 mg total) by mouth daily before breakfast. 30 tablet 11   fluticasone (FLONASE SENSIMIST) 27.5 MCG/SPRAY nasal spray 27.5 sprays as needed.     furosemide (LASIX) 40 MG tablet TAKE 1 TABLET BY MOUTH EVERY OTHER DAY 45 tablet 3   losartan (COZAAR) 100 MG tablet TAKE 1 TABLET BY MOUTH EVERY DAY 90 tablet 3   Multiple Vitamin (MULTIVITAMIN WITH MINERALS) TABS tablet Take 1 tablet by mouth daily.     rosuvastatin (CRESTOR) 5 MG tablet Take 1 tablet (5 mg  total) by mouth daily. 90 tablet 3   spironolactone (ALDACTONE) 25 MG tablet TAKE 1 TABLET BY MOUTH EVERY DAY 90 tablet 3   venlafaxine XR (EFFEXOR-XR) 75 MG 24 hr capsule Take 1 capsule (75 mg total) by mouth daily with breakfast. 90 capsule 3   Vitamin D, Ergocalciferol, (DRISDOL) 50000 UNITS CAPS capsule Take 50,000 Units by mouth every 7 (seven) days. Fridays     No current facility-administered medications for this visit.     PHYSICAL EXAMINATION: ECOG PERFORMANCE STATUS: {CHL ONC ECOG [phone]}  There were no vitals filed for this visit. There were no vitals filed for this visit.  BREAST:*** No palpable masses or nodules in either right or left breasts. No palpable axillary supraclavicular or infraclavicular adenopathy no breast tenderness or nipple discharge. (exam performed in the presence of a chaperone)  LABORATORY DATA:  I have reviewed the data as listed    Latest Ref Rng & Units 05/07/2022    3:01 PM 06/21/2021   11:42 AM 11/09/2020   10:01 AM  CMP  Glucose 70 - 99 mg/dL 108  98  124   BUN 8 - 23 mg/dL '10  10  11   '$ Creatinine 0.44 - 1.00 mg/dL 0.79  0.74  0.77   Sodium 135 - 145 mmol/L 141  139  138   Potassium 3.5 - 5.1 mmol/L 3.9  4.1  3.7   Chloride 98 - 111 mmol/L 106  104  97   CO2 22 - 32 mmol/L '24  29  21   '$ Calcium 8.9 - 10.3 mg/dL 9.2  9.6  9.8   Total Protein 6.5 - 8.1 g/dL  7.2    Total Bilirubin 0.3 - 1.2 mg/dL  0.6    Alkaline Phos 38 - 126 U/L  77    AST 15 - 41 U/L  19    ALT 0 - 44 U/L  10      Lab Results  Component Value Date   WBC 6.8 06/21/2021   HGB 12.7 06/21/2021   HCT 38.3 06/21/2021   MCV 92.7 06/21/2021   PLT 193 06/21/2021   NEUTROABS 4.4 06/21/2021    ASSESSMENT & PLAN:  No problem-specific Assessment & Plan notes found for this encounter.    No orders of the defined types were placed in this encounter.  The patient has a good understanding of the overall plan. she agrees with it. she will call with any problems that may develop before the next visit here. Total time spent: 30 mins including face to face time and time spent for planning, charting and co-ordination of care   Suzzette Righter, [place] 06/22/22    I Gardiner Coins am acting as a Education administrator for [employer]  ***

## 2022-06-25 ENCOUNTER — Inpatient Hospital Stay
Payer: Federal, State, Local not specified - PPO | Attending: Hematology and Oncology | Admitting: Hematology and Oncology

## 2022-06-25 ENCOUNTER — Other Ambulatory Visit: Payer: Self-pay

## 2022-06-25 ENCOUNTER — Other Ambulatory Visit: Payer: Self-pay | Admitting: *Deleted

## 2022-06-25 ENCOUNTER — Ambulatory Visit: Payer: Federal, State, Local not specified - PPO | Attending: Cardiovascular Disease

## 2022-06-25 ENCOUNTER — Ambulatory Visit
Admission: RE | Admit: 2022-06-25 | Discharge: 2022-06-25 | Disposition: A | Discharge status: Home / Self Care | Payer: Federal, State, Local not specified - PPO | Source: Ambulatory Visit | Attending: Hematology and Oncology | Admitting: Hematology and Oncology

## 2022-06-25 VITALS — BP 128/80 | HR 88 | Temp 97.9°F | Resp 18 | Ht 67.0 in | Wt 187.2 lb

## 2022-06-25 DIAGNOSIS — I509 Heart failure, unspecified: Secondary | ICD-10-CM | POA: Diagnosis not present

## 2022-06-25 DIAGNOSIS — M2560 Stiffness of unspecified joint, not elsewhere classified: Secondary | ICD-10-CM

## 2022-06-25 DIAGNOSIS — Z79899 Other long term (current) drug therapy: Secondary | ICD-10-CM | POA: Diagnosis not present

## 2022-06-25 DIAGNOSIS — Z79811 Long term (current) use of aromatase inhibitors: Secondary | ICD-10-CM | POA: Diagnosis not present

## 2022-06-25 DIAGNOSIS — Z170 Estrogen receptor positive status [ER+]: Secondary | ICD-10-CM | POA: Insufficient documentation

## 2022-06-25 DIAGNOSIS — C50411 Malignant neoplasm of upper-outer quadrant of right female breast: Secondary | ICD-10-CM | POA: Insufficient documentation

## 2022-06-25 DIAGNOSIS — Z9011 Acquired absence of right breast and nipple: Secondary | ICD-10-CM | POA: Insufficient documentation

## 2022-06-25 DIAGNOSIS — Z95810 Presence of automatic (implantable) cardiac defibrillator: Secondary | ICD-10-CM

## 2022-06-25 DIAGNOSIS — D510 Vitamin B12 deficiency anemia due to intrinsic factor deficiency: Secondary | ICD-10-CM | POA: Diagnosis not present

## 2022-06-25 DIAGNOSIS — I5022 Chronic systolic (congestive) heart failure: Secondary | ICD-10-CM

## 2022-06-25 DIAGNOSIS — Z1231 Encounter for screening mammogram for malignant neoplasm of breast: Secondary | ICD-10-CM

## 2022-06-25 NOTE — Progress Notes (Signed)
Verbal orders received from MD to place order for physical therapy related to decreased ROM in right arm.  Orders placed.

## 2022-06-25 NOTE — Progress Notes (Signed)
EPIC Encounter for ICM Monitoring  Patient Name: Loretta Thomas is a 72 y.o. female Date: 06/25/2022 Primary Care Physican: Tempie Hoist, FNP Primary Cardiologist: [place] Electrophysiologist: Mealor Bi-V Pacing:   >99%       05/07/2022 Weight: 193 lbs 05/10/2022 Weight: 198 lbs    AT/AF Burden: <1%                                              Spoke with patient and heart failure questions reviewed.  Transmission results reviewed.  Pt asymptomatic for fluid accumulation.  She reports she is not feeling great today and could be related to possible dryness in response to extra 40 mg Furosemide.    Diet:  Does not follow low salt diet.  She has been eating a lot of fasts foods and high salty snacks in the past week and advised this will contribute to possible fluid accumulation.     Corvue thoracic impedance suggesting fluid levels returned to normal after taking extra Furosemide but changed to possible dryness in response to 1 extra Furosemide.    Prescribed:  Furosemide 40 mg take 1 tablet every other day.  She self adjusts Furosemide as needed.   Spironolactone 25 mg take 0.5 tablet (12.5 mg total) by mouth daily Farxiga '10mg'$  take 1 tablet by mouth daily    Labs: 05/07/2022 Creatinine 0.79, BUN 10, Potassium 3.9, Sodium 141, GFR >60 A complete set of results can be found in Results Review.   Recommendations:  Advised to drink at least 64 oz fluid for the next few days to help balance hydration.       Follow-up plan: ICM clinic phone appointment on 07/02/2022 to recheck fluid levels.   91 day device clinic remote transmission 07/03/2022.     EP/Cardiology Office Visits:  Recall 02/04/2022 with Oda Kilts, PA.   Recall 02/01/2023 with Dr Haroldine Laws.    Copy of ICM check sent to Dr Myles Gip.  3 month ICM trend: 06/25/2022.    12-14 Month ICM trend:     [name], RN 06/25/2022 12:02 PM

## 2022-06-25 NOTE — Assessment & Plan Note (Signed)
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 Mammogram  06/13/2020  benign. Breast Density Category B. 2.  Breast exam: 06/21/20: Benign 3.  Breast MRI 10/22/2017: Benign, patient will need annual breast MRIs.   Congestive heart failure: Currently on therapy   Prior history of pernicious anemia on monthly B12 injections: B12 injections   Return to clinic in 1 year for follow-up

## 2022-06-25 NOTE — Progress Notes (Signed)
Pt requesting physical therapy referral be faxed to [facility] in [place].  RN successfully faxed to [phone].

## 2022-07-02 ENCOUNTER — Ambulatory Visit: Payer: Federal, State, Local not specified - PPO | Attending: Cardiovascular Disease

## 2022-07-02 DIAGNOSIS — I5022 Chronic systolic (congestive) heart failure: Secondary | ICD-10-CM

## 2022-07-02 DIAGNOSIS — Z95810 Presence of automatic (implantable) cardiac defibrillator: Secondary | ICD-10-CM

## 2022-07-02 NOTE — Progress Notes (Signed)
EPIC Encounter for ICM Monitoring  Patient Name: Loretta Thomas is a 72 y.o. female Date: 07/02/2022 Primary Care Physican: Tempie Hoist, FNP Primary Cardiologist: [place] Electrophysiologist: Mealor Bi-V Pacing:   >99%       05/07/2022 Weight: 193 lbs 05/10/2022 Weight: 198 lbs    AT/AF Burden: <1%                                              Spoke with patient and heart failure questions reviewed.  Transmission results reviewed.  Pt asymptomatic for fluid accumulation.  Pt reports she has been drinking a lot of fluid since last ICM report due to the new weight loss med she is taking is causing severe constipation    Diet:  Does not follow low salt diet.  .     Corvue thoracic impedance suggesting fluid levels returned to normal.    Prescribed:  Furosemide 40 mg take 1 tablet every other day.  She self adjusts Furosemide as needed.   Spironolactone 25 mg take 0.5 tablet (12.5 mg total) by mouth daily Farxiga 10mg  take 1 tablet by mouth daily    Labs: 05/07/2022 Creatinine 0.79, BUN 10, Potassium 3.9, Sodium 141, GFR >60 A complete set of results can be found in Results Review.   Recommendations:  Recommendation to limit salt intake to 2000 mg daily and fluid intake to 64 oz daily.  Encouraged to call if experiencing any fluid symptoms.    Follow-up plan: ICM clinic phone appointment on 07/23/2022.   91 day device clinic remote transmission 10/02/2022.     EP/Cardiology Office Visits:  Recall 02/04/2022 with Oda Kilts, PA.   Recall 02/01/2023 with Dr Haroldine Laws.    Copy of ICM check sent to Dr Myles Gip.  3 month ICM trend: 07/02/2022.    12-14 Month ICM trend:     [name], RN 07/02/2022 1:40 PM

## 2022-07-03 ENCOUNTER — Ambulatory Visit (INDEPENDENT_AMBULATORY_CARE_PROVIDER_SITE_OTHER): Payer: Federal, State, Local not specified - PPO

## 2022-07-03 DIAGNOSIS — I428 Other cardiomyopathies: Secondary | ICD-10-CM | POA: Diagnosis not present

## 2022-07-04 LAB — CUP PACEART REMOTE DEVICE CHECK
Battery Remaining Longevity: 32 mo
Battery Remaining Percentage: 40 %
Battery Voltage: 2.92 V
Brady Statistic AP VP Percent: 1 %
Brady Statistic AP VS Percent: 1 %
Brady Statistic AS VP Percent: 99 %
Brady Statistic AS VS Percent: 1 %
Brady Statistic RA Percent Paced: 1 %
Date Time Interrogation Session: 20240318082815
HighPow Impedance: 84 Ohm
HighPow Impedance: 84 Ohm
Implantable Lead Connection Status: 753985
Implantable Lead Connection Status: 753985
Implantable Lead Connection Status: 753985
Implantable Lead Implant Date: 20190920
Implantable Lead Implant Date: 20190920
Implantable Lead Implant Date: 20190920
Implantable Lead Location: 753858
Implantable Lead Location: 753859
Implantable Lead Location: 753860
Implantable Pulse Generator Implant Date: 20190920
Lead Channel Impedance Value: 1100 Ohm
Lead Channel Impedance Value: 400 Ohm
Lead Channel Impedance Value: 600 Ohm
Lead Channel Pacing Threshold Amplitude: 0.5 V
Lead Channel Pacing Threshold Amplitude: 0.75 V
Lead Channel Pacing Threshold Amplitude: 0.75 V
Lead Channel Pacing Threshold Pulse Width: 0.5 ms
Lead Channel Pacing Threshold Pulse Width: 0.5 ms
Lead Channel Pacing Threshold Pulse Width: 0.5 ms
Lead Channel Sensing Intrinsic Amplitude: 12 mV
Lead Channel Sensing Intrinsic Amplitude: 3.9 mV
Lead Channel Setting Pacing Amplitude: 2 V
Lead Channel Setting Pacing Amplitude: 2 V
Lead Channel Setting Pacing Amplitude: 2 V
Lead Channel Setting Pacing Pulse Width: 0.5 ms
Lead Channel Setting Pacing Pulse Width: 0.5 ms
Lead Channel Setting Sensing Sensitivity: 0.5 mV
Pulse Gen Serial Number: 9833194
Zone Setting Status: 755011

## 2022-07-05 ENCOUNTER — Other Ambulatory Visit (HOSPITAL_COMMUNITY): Payer: Self-pay | Admitting: Internal Medicine

## 2022-07-20 ENCOUNTER — Other Ambulatory Visit (HOSPITAL_COMMUNITY): Payer: Self-pay | Admitting: Internal Medicine

## 2022-07-23 ENCOUNTER — Ambulatory Visit: Payer: Federal, State, Local not specified - PPO | Attending: Cardiovascular Disease

## 2022-07-23 DIAGNOSIS — I5022 Chronic systolic (congestive) heart failure: Secondary | ICD-10-CM

## 2022-07-23 DIAGNOSIS — Z95810 Presence of automatic (implantable) cardiac defibrillator: Secondary | ICD-10-CM | POA: Diagnosis not present

## 2022-07-24 ENCOUNTER — Telehealth: Payer: Self-pay

## 2022-07-24 NOTE — Progress Notes (Signed)
Spoke with patient and heart failure questions reviewed.  Transmission results reviewed.  Pt reports she is feeling fine and could not tell she has any fluid.  She is compliant with Furosemide.  Advised to take Lasix 40 mg daily x 3 consecutive days and send remote transmission for review on 4/12.  She is prescribed Lasix 40 mg every other day and 4/12 will be off day.

## 2022-07-24 NOTE — Telephone Encounter (Signed)
Remote ICM transmission received.  Attempted call to patient regarding ICM remote transmission and left detailed message per DPR.  Advised to return call.  

## 2022-07-24 NOTE — Progress Notes (Signed)
EPIC Encounter for ICM Monitoring  Patient Name: Loretta Thomas is a 72 y.o. female Date: 07/24/2022 Primary Care Physican: Delorse Lek, FNP Primary Cardiologist: [place] Electrophysiologist: Mealor Bi-V Pacing:   >99%       05/07/2022 Weight: 193 lbs 05/10/2022 Weight: 198 lbs    AT/AF Burden: <1%                                              Attempted call to patient and unable to reach.  Left message to return call. Transmission reviewed.    Diet:  Does not follow low salt diet.       Corvue thoracic impedance suggesting possible fluid accumulation starting 4/5.   Also suggesting possible fluid accumulation from 3/29-4/2.   Prescribed:  Furosemide 40 mg take 1 tablet every other day.  She self adjusts Furosemide as needed.   Spironolactone 25 mg take 0.5 tablet (12.5 mg total) by mouth daily Farxiga 10mg  take 1 tablet by mouth daily    Labs: 05/07/2022 Creatinine 0.79, BUN 10, Potassium 3.9, Sodium 141, GFR >60 A complete set of results can be found in Results Review.   Recommendations:  Unable to reach.   Left message to return call.   Follow-up plan: ICM clinic phone appointment on 07/30/2022 to recheck fluid levels.   91 day device clinic remote transmission 10/02/2022.     EP/Cardiology Office Visits:  Recall 02/04/2022 with Otilio Saber, PA.   Recall 02/01/2023 with Dr Gala Romney.    Copy of ICM check sent to Dr Nelly Laurence.   3 month ICM trend: 07/23/2022.    12-14 Month ICM trend:     Karie Soda, RN 07/24/2022 2:37 PM

## 2022-07-27 ENCOUNTER — Ambulatory Visit: Payer: Federal, State, Local not specified - PPO | Admitting: Obstetrics and Gynecology

## 2022-07-30 ENCOUNTER — Ambulatory Visit: Payer: Federal, State, Local not specified - PPO | Attending: Cardiovascular Disease

## 2022-07-30 DIAGNOSIS — Z95810 Presence of automatic (implantable) cardiac defibrillator: Secondary | ICD-10-CM

## 2022-07-30 DIAGNOSIS — I5022 Chronic systolic (congestive) heart failure: Secondary | ICD-10-CM

## 2022-07-30 NOTE — Progress Notes (Signed)
EPIC Encounter for ICM Monitoring  Patient Name: Loretta Thomas is a 72 y.o. female Date: 07/30/2022 Primary Care Physican: Delorse Lek, FNP Primary Cardiologist: [place] Electrophysiologist: Mealor Bi-V Pacing:   >99%       05/07/2022 Weight: 193 lbs 05/10/2022 Weight: 198 lbs    AT/AF Burden: <1%                                              Spoke with patient and heart failure questions reviewed.  Transmission results reviewed.  Pt asymptomatic for fluid accumulation.  Reports feeling well at this time and voices no complaints.     Diet:  Does not follow low salt diet.       Corvue thoracic impedance suggesting fluid levels returned to normal after taking extra Furosemide.   Prescribed:  Furosemide 40 mg take 1 tablet every other day.  She self adjusts Furosemide as needed.   Spironolactone 25 mg take 0.5 tablet (12.5 mg total) by mouth daily Farxiga 10mg  take 1 tablet by mouth daily    Labs: 05/07/2022 Creatinine 0.79, BUN 10, Potassium 3.9, Sodium 141, GFR >60 A complete set of results can be found in Results Review.   Recommendations:  No changes and encouraged to call if experiencing any fluid symptoms.   Follow-up plan: ICM clinic phone appointment on 08/27/2022.   91 day device clinic remote transmission 10/02/2022.     EP/Cardiology Office Visits: Provided number for EP scheduler and advised to call for yearly office visit.  Recall 02/04/2022 with Otilio Saber, PA.   Recall 02/01/2023 with Dr Gala Romney.    Copy of ICM check sent to Dr Nelly Laurence.   3 month ICM trend: 07/30/2022.    12-14 Month ICM trend:     Karie Soda, RN 07/30/2022 8:59 AM

## 2022-08-06 ENCOUNTER — Other Ambulatory Visit: Payer: Self-pay | Admitting: Cardiology

## 2022-08-14 NOTE — Progress Notes (Signed)
Remote ICD transmission.   

## 2022-08-27 ENCOUNTER — Ambulatory Visit: Payer: Federal, State, Local not specified - PPO | Attending: Cardiovascular Disease

## 2022-08-27 DIAGNOSIS — I5022 Chronic systolic (congestive) heart failure: Secondary | ICD-10-CM

## 2022-08-27 DIAGNOSIS — Z95810 Presence of automatic (implantable) cardiac defibrillator: Secondary | ICD-10-CM | POA: Diagnosis not present

## 2022-08-27 NOTE — Progress Notes (Unsigned)
EPIC Encounter for ICM Monitoring  Patient Name: Loretta Thomas is a 72 y.o. female Date: 08/27/2022 Primary Care Physican: Delorse Lek, FNP Primary Cardiologist: [place] Electrophysiologist: Mealor Bi-V Pacing:   >99%       05/07/2022 Weight: 193 lbs 05/10/2022 Weight: 198 lbs    AT/AF Burden: <1%                                              Attempted call to patient and unable to reach.  Left detailed message per DPR regarding transmission. Transmission reviewed.    Diet:  Does not follow low salt diet.       Corvue thoracic impedance suggesting possible fluid accumulation starting 5/12.   Prescribed:  Furosemide 40 mg take 1 tablet every other day.  She self adjusts Furosemide as needed.   Spironolactone 25 mg take 0.5 tablet (12.5 mg total) by mouth daily Farxiga 10mg  take 1 tablet by mouth daily    Labs: 05/07/2022 Creatinine 0.79, BUN 10, Potassium 3.9, Sodium 141, GFR >60 A complete set of results can be found in Results Review.   Recommendations:  Left voice mail with ICM number and encouraged to call if experiencing any fluid symptoms.   Follow-up plan: ICM clinic phone appointment on 09/03/2022 to recheck fluid levels.   91 day device clinic remote transmission 10/02/2022.     EP/Cardiology Office Visits:   Provided number for EP scheduler and advised to call for yearly office visit.  Recall 02/04/2022 with Otilio Saber, PA.   Recall 02/01/2023 with Dr Gala Romney.    Copy of ICM check sent to Dr Nelly Laurence.   3 month ICM trend: 08/27/2022.    12-14 Month ICM trend:     Karie Soda, RN 08/27/2022 7:58 AM

## 2022-08-28 ENCOUNTER — Telehealth: Payer: Self-pay

## 2022-08-28 NOTE — Telephone Encounter (Signed)
Remote ICM transmission received.  Attempted call to patient regarding ICM remote transmission and left detailed message per DPR.  Advised to return call for any fluid symptoms or questions. Next ICM remote transmission scheduled 09/03/2022.    

## 2022-08-29 NOTE — Progress Notes (Signed)
Spoke with patient and heart failure questions reviewed.  Transmission results reviewed.  Pt asymptomatic for fluid accumulation.  Reports feeling well at this time and voices no complaints.   Patient ate at restaurants this past weekend which may contribute to decreased impedance.    Weight: 183-184 lbs  Next ICM clinic phone appointment changed to 10/01/2022 since fluid levels have returned to normal.    08/29/2022 Updated Corvue thoracic impedance suggesting fluid levels returned to normal.

## 2022-09-05 ENCOUNTER — Telehealth (HOSPITAL_COMMUNITY): Payer: Self-pay

## 2022-09-05 NOTE — Telephone Encounter (Signed)
Per Shanda Bumps, transmission looked ok. Have her contact her PT about issues.  Left message for her.

## 2022-09-05 NOTE — Telephone Encounter (Signed)
Transmission printed and put at your desk

## 2022-09-05 NOTE — Telephone Encounter (Signed)
Patient is calling because she is have PT on her neck and back and she is feeling some sensations in her chest. She wants to discuss if could be related to heart or if PT is causing this. I tried to reach her to get more information. Any suggestions?

## 2022-09-06 NOTE — Telephone Encounter (Signed)
Pt aware.

## 2022-09-17 ENCOUNTER — Encounter: Payer: Self-pay | Admitting: Internal Medicine

## 2022-10-01 ENCOUNTER — Ambulatory Visit: Payer: Federal, State, Local not specified - PPO | Attending: Cardiovascular Disease

## 2022-10-01 ENCOUNTER — Telehealth: Payer: Self-pay

## 2022-10-01 DIAGNOSIS — I5022 Chronic systolic (congestive) heart failure: Secondary | ICD-10-CM

## 2022-10-01 DIAGNOSIS — Z95810 Presence of automatic (implantable) cardiac defibrillator: Secondary | ICD-10-CM | POA: Diagnosis not present

## 2022-10-01 NOTE — Progress Notes (Signed)
Spoke with patient and heart failure questions reviewed.  Transmission results reviewed.  Pt asymptomatic for fluid accumulation.  Reports feeling well at this time and voices no complaints.  Weight is stable 183 lbs for the past month.  She cut back on amount of food she has been eating but still eating foods that are high in salt.  Also drinking Boost supplement daily.    Discussed ways to limit salt and fluid intake.  She will take extra Furosemide tomorrow and recheck fluid levels on 6/24.

## 2022-10-01 NOTE — Telephone Encounter (Signed)
Remote ICM transmission received.  Attempted call to patient regarding ICM remote transmission and left detailed message per DPR.  Left ICM phone number and advised to return call for any fluid symptoms or questions. Next ICM remote transmission scheduled 10/08/2022.    

## 2022-10-01 NOTE — Progress Notes (Signed)
EPIC Encounter for ICM Monitoring  Patient Name: Loretta Thomas is a 72 y.o. female Date: 10/01/2022 Primary Care Physican: Delorse Lek, FNP Primary Cardiologist: [place] Electrophysiologist: Mealor Bi-V Pacing:   >99%       05/07/2022 Weight: 193 lbs 05/10/2022 Weight: 198 lbs    AT/AF Burden: <1%                                              Attempted call to patient and unable to reach.  Left detailed message per DPR regarding transmission. Transmission reviewed.    Diet:  Does not follow low salt diet.       Corvue thoracic impedance suggesting possible fluid accumulation starting 6/13   Prescribed:  Furosemide 40 mg take 1 tablet every other day.  She self adjusts Furosemide as needed.   Spironolactone 25 mg take 0.5 tablet (12.5 mg total) by mouth daily Farxiga 10mg  take 1 tablet by mouth daily    Labs: 05/07/2022 Creatinine 0.79, BUN 10, Potassium 3.9, Sodium 141, GFR >60 A complete set of results can be found in Results Review.   Recommendations: Left voice mail with ICM number and encouraged to call if experiencing any fluid symptoms.   Follow-up plan: ICM clinic phone appointment on 10/08/2022 to recheck fluid levels.   91 day device clinic remote transmission 10/02/2022.     EP/Cardiology Office Visits:   Provided number for EP scheduler and advised to call for yearly office visit.  Recall 02/04/2022 with Otilio Saber, PA.   Recall 02/01/2023 with Dr Gala Romney.    Copy of ICM check sent to Dr Nelly Laurence.   3 month ICM trend: 10/01/2022.    12-14 Month ICM trend:     Karie Soda, RN 10/01/2022 1:43 PM

## 2022-10-08 ENCOUNTER — Ambulatory Visit: Payer: Federal, State, Local not specified - PPO | Attending: Cardiovascular Disease

## 2022-10-08 DIAGNOSIS — Z95810 Presence of automatic (implantable) cardiac defibrillator: Secondary | ICD-10-CM

## 2022-10-08 DIAGNOSIS — I5022 Chronic systolic (congestive) heart failure: Secondary | ICD-10-CM

## 2022-10-10 NOTE — Progress Notes (Signed)
EPIC Encounter for ICM Monitoring  Patient Name: Loretta Thomas is a 72 y.o. female Date: 10/10/2022 Primary Care Physican: Delorse Lek, FNP Primary Cardiologist: [place] Electrophysiologist: Mealor Bi-V Pacing:   >99%       05/07/2022 Weight: 193 lbs 05/10/2022 Weight: 198 lbs    AT/AF Burden: <1%                                              Attempted call to patient and unable to reach.  Left detailed message per DPR regarding transmission. Transmission reviewed.    Diet:  Does not follow low salt diet.       Corvue thoracic impedance suggesting possible dryness in response to take extra Furosemide (recommended to take extra x 1 day).   Prescribed:  Furosemide 40 mg take 1 tablet every other day.  She self adjusts Furosemide as needed.   Spironolactone 25 mg take 0.5 tablet (12.5 mg total) by mouth daily Farxiga 10mg  take 1 tablet by mouth daily    Labs: 05/07/2022 Creatinine 0.79, BUN 10, Potassium 3.9, Sodium 141, GFR >60 A complete set of results can be found in Results Review.   Recommendations: Left voice mail with ICM number and encouraged to call if experiencing any fluid symptoms.   Follow-up plan: ICM clinic phone appointment on 11/05/2022.   91 day device clinic remote transmission 01/01/2023.     EP/Cardiology Office Visits:   10/12/2022 with Dr Nelly Laurence.   Recall 02/01/2023 with Dr Gala Romney.    Copy of ICM check sent to Dr Nelly Laurence.   3 month ICM trend: 10/08/2022.    12-14 Month ICM trend:     Karie Soda, RN 10/10/2022 12:29 PM

## 2022-10-10 NOTE — Progress Notes (Signed)
Spoke with patient and heart failure questions reviewed.  Transmission results reviewed.  Pt asymptomatic for fluid accumulation.  Reports feeling well at this time and voices no complaints.   

## 2022-10-12 ENCOUNTER — Ambulatory Visit: Payer: Federal, State, Local not specified - PPO | Attending: Cardiovascular Disease | Admitting: Cardiovascular Disease

## 2022-10-12 ENCOUNTER — Encounter: Payer: Self-pay | Admitting: Cardiovascular Disease

## 2022-10-12 VITALS — BP 114/70 | HR 82 | Ht 67.0 in | Wt 182.2 lb

## 2022-10-12 DIAGNOSIS — I447 Left bundle-branch block, unspecified: Secondary | ICD-10-CM | POA: Diagnosis not present

## 2022-10-12 DIAGNOSIS — I428 Other cardiomyopathies: Secondary | ICD-10-CM

## 2022-10-12 DIAGNOSIS — I5022 Chronic systolic (congestive) heart failure: Secondary | ICD-10-CM | POA: Diagnosis not present

## 2022-10-12 DIAGNOSIS — Z95810 Presence of automatic (implantable) cardiac defibrillator: Secondary | ICD-10-CM | POA: Diagnosis not present

## 2022-10-12 NOTE — Patient Instructions (Signed)
Medication Instructions:  Your physician recommends that you continue on your current medications as directed. Please refer to the Current Medication list given to you today. *If you need a refill on your cardiac medications before your next appointment, please call your pharmacy*   Follow-Up: At [facility], you and your health needs are our priority.  As part of our continuing mission to provide you with exceptional heart care, we have created designated Provider Care Teams.  These Care Teams include your primary Cardiologist (physician) and Advanced Practice Providers (APPs -  Physician Assistants and Nurse Practitioners) who all work together to provide you with the care you need, when you need it.  We recommend signing up for the patient portal called "MyChart".  Sign up information is provided on this After Visit Summary.  MyChart is used to connect with patients for Virtual Visits (Telemedicine).  Patients are able to view lab/test results, encounter notes, upcoming appointments, etc.  Non-urgent messages can be sent to your provider as well.   To learn more about what you can do with MyChart, go to [email or web address].    Your next appointment:   1 year(s)  Provider:   Augustus Mealor, MD  

## 2022-10-12 NOTE — Progress Notes (Signed)
  Electrophysiology Office Note:    Date:  10/12/2022   ID:  SHEYLY BAYONA, DOB Dec 19, 1950, MRN [id number]  PCP:  Delorse Lek, FNP   [facility] Providers Cardiologist:  None Electrophysiologist:  Hillis Range, MD (Inactive)  Advanced Heart Failure:  Arvilla Meres, MD  Sleep Medicine:  Armanda Magic, MD     Referring MD: Delorse Lek, FNP   History of Present Illness:    Loretta Thomas is a 72 y.o. 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     Her history of CHF dates back to no later than 2019 when an echo showed an EF of 15%. BiV ICD was placed in September, 2019 due to NICM and LBBB. EF has improved progressively over the years. Most recently, 05/07/2022, EF had recovered to 50-55%.     Today, she reports that she is doing very well. She does occasionally feel a "twinge" in her left chest/axilla. she has no device related complaints -- no new tenderness, drainage, redness.   EKGs/Labs/Other Studies Reviewed Today:    Echocardiogram:  TTE 05/07/22 EF 50-55%   Monitors:   Stress testing:   Advanced imaging:   Cardiac catherization  Multiple -- reviewed in Epic.  EKG:   EKG Interpretation Date/Time:  Friday October 12 2022 11:28:10 EDT Ventricular Rate:  82 PR Interval:  140 QRS Duration:  126 QT Interval:  422 QTC Calculation: 493 R Axis:   86  Text Interpretation: Atrial-sensed ventricular-paced rhythm When compared with ECG of 17-Mar-2020 12:40, No significant change was found Confirmed by York Pellant [phone]) on 10/12/2022 11:50:17 AM     Physical Exam:    VS:  BP 114/70   Pulse 82   Ht 5\' 7"  (1.702 m)   Wt 182 lb 3.2 oz (82.6 kg)   SpO2 96%   BMI 28.54 kg/m     Wt Readings from Last 3 Encounters:  10/12/22 182 lb 3.2 oz (82.6 kg)  06/25/22 187 lb 3.2 oz (84.9 kg)  05/07/22 193 lb (87.5 kg)     GEN: Well nourished,  well developed in no acute distress CARDIAC: RRR, no murmurs, rubs, gallops RESPIRATORY:  Normal work of breathing MUSCULOSKELETAL: no edema    ASSESSMENT & PLAN:    St Jude BiV ICD Normal function --LV capture threshold is slightly elevated.  LV impedance is chronically within high normal range Possible intermittent stim effect from LV lead.  Will continue to monitor I reviewed today's interrogation in detail see Paceart.  CHF with recovered EF Continue carvedilol 3.125, Farxiga 10, losartan 100, spironolactone 25  Atrial high rate episodes Brief. More likely AT or sinus tachycardia Continue to monitor   This is my first time meeting [name]. I spent 32 minutes this visit.  Signed, Maurice Small, MD  10/12/2022 11:50 AM    [facility]

## 2022-10-15 LAB — CUP PACEART INCLINIC DEVICE CHECK
Battery Remaining Longevity: 27 mo
Brady Statistic RA Percent Paced: 0.02 %
Brady Statistic RV Percent Paced: 99.94 %
Date Time Interrogation Session: 20240628113700
HighPow Impedance: 85.5 Ohm
Implantable Lead Connection Status: 753985
Implantable Lead Connection Status: 753985
Implantable Lead Connection Status: 753985
Implantable Lead Implant Date: 20190920
Implantable Lead Implant Date: 20190920
Implantable Lead Implant Date: 20190920
Implantable Lead Location: 753858
Implantable Lead Location: 753859
Implantable Lead Location: 753860
Implantable Pulse Generator Implant Date: 20190920
Lead Channel Impedance Value: 1200 Ohm
Lead Channel Impedance Value: 412.5 Ohm
Lead Channel Impedance Value: 662.5 Ohm
Lead Channel Pacing Threshold Amplitude: 0.5 V
Lead Channel Pacing Threshold Amplitude: 0.5 V
Lead Channel Pacing Threshold Amplitude: 0.75 V
Lead Channel Pacing Threshold Amplitude: 0.75 V
Lead Channel Pacing Threshold Amplitude: 0.75 V
Lead Channel Pacing Threshold Amplitude: 0.75 V
Lead Channel Pacing Threshold Pulse Width: 0.5 ms
Lead Channel Pacing Threshold Pulse Width: 0.5 ms
Lead Channel Pacing Threshold Pulse Width: 0.5 ms
Lead Channel Pacing Threshold Pulse Width: 0.5 ms
Lead Channel Pacing Threshold Pulse Width: 0.5 ms
Lead Channel Pacing Threshold Pulse Width: 0.5 ms
Lead Channel Sensing Intrinsic Amplitude: 11.8 mV
Lead Channel Sensing Intrinsic Amplitude: 4.4 mV
Lead Channel Setting Pacing Amplitude: 2 V
Lead Channel Setting Pacing Amplitude: 2 V
Lead Channel Setting Pacing Amplitude: 2 V
Lead Channel Setting Pacing Pulse Width: 0.5 ms
Lead Channel Setting Pacing Pulse Width: 0.5 ms
Lead Channel Setting Sensing Sensitivity: 0.5 mV
Pulse Gen Serial Number: 9833194
Zone Setting Status: 755011

## 2022-10-22 ENCOUNTER — Telehealth (HOSPITAL_COMMUNITY): Payer: Self-pay

## 2022-10-22 ENCOUNTER — Other Ambulatory Visit (HOSPITAL_COMMUNITY): Payer: Self-pay

## 2022-10-22 NOTE — Telephone Encounter (Signed)
Advanced Heart Failure Patient Advocate Encounter  Prior authorization for Marcelline Deist has been submitted and approved. Test billing returns $15 for 30 day supply.  Key: [id number] Effective: 10/22/2022 to 10/22/2023  Burnell Blanks, CPhT Rx Patient Advocate Phone: [phone]

## 2022-10-25 ENCOUNTER — Other Ambulatory Visit: Payer: Self-pay | Admitting: *Deleted

## 2022-10-25 ENCOUNTER — Other Ambulatory Visit: Payer: Self-pay

## 2022-10-25 DIAGNOSIS — C50411 Malignant neoplasm of upper-outer quadrant of right female breast: Secondary | ICD-10-CM

## 2022-10-25 MED ORDER — ERGOCALCIFEROL 1.25 MG (50000 UT) PO CAPS: 50000.0000 [IU] | ORAL_CAPSULE | ORAL | 3 refills | Status: DC

## 2022-10-25 MED ORDER — VENLAFAXINE HCL ER 75 MG PO CP24: 75.0000 mg | ORAL_CAPSULE | Freq: Every day | ORAL | 3 refills | Status: DC

## 2022-11-05 ENCOUNTER — Ambulatory Visit: Payer: Federal, State, Local not specified - PPO

## 2022-11-05 DIAGNOSIS — Z95810 Presence of automatic (implantable) cardiac defibrillator: Secondary | ICD-10-CM

## 2022-11-05 DIAGNOSIS — I5022 Chronic systolic (congestive) heart failure: Secondary | ICD-10-CM

## 2022-11-07 NOTE — Progress Notes (Signed)
EPIC Encounter for ICM Monitoring  Patient Name: Loretta Thomas is a 72 y.o. female Date: 11/07/2022 Primary Care Physican: Delorse Lek, FNP Primary Cardiologist: Bensimhon Electrophysiologist: Mealor Bi-V Pacing:   >99%       06/25/2022 Office 187 lbs     AT/AF Burden: 0%                                              Spoke with patient and heart failure questions reviewed.  Transmission results reviewed.  Pt reports she was positive for COVID in the last few days.  [place] the [place] during [place] impedance.    Diet:  Does not follow low salt diet.       Corvue thoracic impedance suggesting possible fluid accumulation from 7/17-7/22.   Prescribed:  Furosemide 40 mg take 1 tablet every other day.  She self adjusts Furosemide as needed.   Spironolactone 25 mg take 0.5 tablet (12.5 mg total) by mouth daily Farxiga 10mg  take 1 tablet by mouth daily    Labs: 05/07/2022 Creatinine 0.79, BUN 10, Potassium 3.9, Sodium 141, GFR >60 A complete set of results can be found in Results Review.   Recommendations:  No changes and encouraged to call if experiencing any fluid symptoms.   Follow-up plan: ICM clinic phone appointment on 12/10/2022.   91 day device clinic remote transmission 01/01/2023.     EP/Cardiology Office Visits:   Recall 10/07/2023 with Dr Nelly Laurence.   Recall 02/01/2023 with Dr Gala Romney.    Copy of ICM check sent to Dr Nelly Laurence.    3 month ICM trend: 11/06/2022.    12-14 Month ICM trend:     Karie Soda, RN 11/07/2022 7:55 AM

## 2022-11-15 ENCOUNTER — Other Ambulatory Visit (HOSPITAL_COMMUNITY): Payer: Self-pay | Admitting: Internal Medicine

## 2022-11-26 ENCOUNTER — Emergency Department (HOSPITAL_COMMUNITY): Payer: Federal, State, Local not specified - PPO

## 2022-11-26 ENCOUNTER — Emergency Department (HOSPITAL_COMMUNITY)
Admission: EM | Admit: 2022-11-26 | Discharge: 2022-11-27 | Disposition: A | Discharge status: Home / Self Care | Payer: Federal, State, Local not specified - PPO | Attending: Emergency Medicine | Admitting: Emergency Medicine

## 2022-11-26 ENCOUNTER — Encounter (HOSPITAL_COMMUNITY): Payer: Self-pay

## 2022-11-26 ENCOUNTER — Telehealth: Payer: Self-pay

## 2022-11-26 ENCOUNTER — Other Ambulatory Visit: Payer: Self-pay

## 2022-11-26 DIAGNOSIS — R319 Hematuria, unspecified: Secondary | ICD-10-CM | POA: Diagnosis present

## 2022-11-26 DIAGNOSIS — Z853 Personal history of malignant neoplasm of breast: Secondary | ICD-10-CM | POA: Insufficient documentation

## 2022-11-26 DIAGNOSIS — I110 Hypertensive heart disease with heart failure: Secondary | ICD-10-CM | POA: Insufficient documentation

## 2022-11-26 DIAGNOSIS — N390 Urinary tract infection, site not specified: Secondary | ICD-10-CM

## 2022-11-26 DIAGNOSIS — I509 Heart failure, unspecified: Secondary | ICD-10-CM | POA: Insufficient documentation

## 2022-11-26 DIAGNOSIS — E119 Type 2 diabetes mellitus without complications: Secondary | ICD-10-CM | POA: Insufficient documentation

## 2022-11-26 LAB — BASIC METABOLIC PANEL
Anion gap: 12 (ref 5–15)
BUN: 14 mg/dL (ref 8–23)
CO2: 25 mmol/L (ref 22–32)
Calcium: 9.3 mg/dL (ref 8.9–10.3)
Chloride: 100 mmol/L (ref 98–111)
Creatinine, Ser: 0.88 mg/dL (ref 0.44–1.00)
GFR, Estimated: >60 mL/min (ref >60)
Glucose, Bld: 104 mg/dL — ABNORMAL HIGH (ref 70–99)
Potassium: 4 mmol/L (ref 3.5–5.1)
Sodium: 137 mmol/L (ref 135–145)

## 2022-11-26 LAB — CBC
HCT: 41.8 % (ref 36.0–46.0)
Hemoglobin: 13.4 g/dL (ref 12.0–15.0)
MCH: 30.2 pg (ref 26.0–34.0)
MCHC: 32.1 g/dL (ref 30.0–36.0)
MCV: 94.4 fL (ref 80.0–100.0)
Platelets: 191 10*3/uL (ref 150–400)
RBC: 4.43 MIL/uL (ref 3.87–5.11)
RDW: 13.7 % (ref 11.5–15.5)
WBC: 8.8 10*3/uL (ref 4.0–10.5)
nRBC: 0 % (ref 0.0–0.2)

## 2022-11-26 LAB — URINALYSIS, ROUTINE W REFLEX MICROSCOPIC
Bacteria, UA: NONE SEEN
Bilirubin Urine: NEGATIVE
Glucose, UA: >=500 mg/dL — AB
Ketones, ur: NEGATIVE mg/dL
Nitrite: NEGATIVE
Protein, ur: NEGATIVE mg/dL
Specific Gravity, Urine: 1.005 (ref 1.005–1.030)
WBC, UA: >50 WBC/hpf (ref 0–5)
pH: 7 (ref 5.0–8.0)

## 2022-11-26 LAB — TROPONIN I (HIGH SENSITIVITY): Troponin I (High Sensitivity): 3 ng/L (ref <18)

## 2022-11-26 LAB — BRAIN NATRIURETIC PEPTIDE: B Natriuretic Peptide: 16.1 pg/mL (ref 0.0–100.0)

## 2022-11-26 MED ORDER — IOHEXOL 350 MG/ML SOLN
75.0000 mL | Freq: Once | INTRAVENOUS | Status: AC | PRN
  Administered 2022-11-26: 75 mL via INTRAVENOUS

## 2022-11-26 MED ORDER — SODIUM CHLORIDE 0.9 % IV SOLN
2.0000 g | Freq: Once | INTRAVENOUS | Status: AC
  Administered 2022-11-26: 2 g via INTRAVENOUS
  Filled 2022-11-26: qty 20

## 2022-11-26 NOTE — ED Provider Notes (Signed)
EMERGENCY DEPARTMENT AT [facility] Provider Note  MDM   HPI/ROS:  Loretta Thomas is a 72 y.o. female with past medical history of diabetes, CHF presenting with chief complaint of shortness of breath, productive cough, dysuria, urinary urgency/frequency, fevers.  Patient was sent by urgent care provider out of concern for pulmonary embolism and UTI considering patient has been recently diagnosed with COVID and has had worsening shortness of breath since diagnosis a couple weeks ago.  She has not been on any antibiotics recently.  Endorses occasional chest pain that resolves within a few minutes.  No chest pain at this time.  Patient was sent here by urgent care provider out of concern for PE.  Physical exam is notable for: - Overall well-appearing, no acute distress - Lungs clear to auscultation bilaterally with no accessory diminished breath sounds - No lower extremity edema  On my initial evaluation, patient is:  -Vital signs stable. Patient afebrile, hemodynamically stable, and non-toxic appearing. -Additional history obtained from husband at bedside, chart review  Given the patient's history and physical exam, differential diagnosis includes but is not limited to PE, pneumonia, viral infection, CHF exacerbation, UTI, etc.  Interpretations, interventions, and the patient's course of care are documented below.    Cardiopulmonary workup initiated.  UA ordered to assess for UTI.  EKG with pacer spikes and nonspecific conduction delay.  Chest x-ray resulted with no abnormalities.  UA with leukocyte esterase and moderate blood.  Rocephin administered.  Given concern for PE with worsening shortness of breath, CT PE ordered.  Upon reassessment, patient resting comfortably with stable vital signs.  No Tachycardia, hypotension, afebrile.  No new symptoms at this time.  CT PE scan resulted with no evidence of pulmonary thromboembolic disease.  No lung consolidations or  evidence to suggest pneumonia.  No signs of volume overload to suggest heart failure.  BNP resulted within normal limits, troponin resulted at 3.  Very reassuring against cardiopulmonary abnormalities.  Given that patient has remained vitally stable, workup is reassuring, and patient has had no new symptoms, deemed safe and stable for discharge.  Given outpatient prescription for Keflex.  Instructed to take as provided.  Strict return precautions discussed.  Instructed follow-up with primary care provider for further management.  Disposition:  I discussed the plan for discharge with the patient and/or their surrogate at bedside prior to discharge and they were in agreement with the plan and verbalized understanding of the return precautions provided. All questions answered to the best of my ability. Ultimately, the patient was discharged in stable condition with stable vital signs. I am reassured that they are capable of close follow up and good social support at home.   Clinical Impression:  1. Lower urinary tract infectious disease     Rx / DC Orders ED Discharge Orders     None       The plan for this patient was discussed with Dr. Jodi Mourning, who voiced agreement and who oversaw evaluation and treatment of this patient.   Clinical Complexity A medically appropriate history, review of systems, and physical exam was performed.  My independent interpretations of EKG, labs, and radiology are documented in the ED course above.   Click here for ABCD2, HEART and other calculatorsREFRESH Note before signing   Patient's presentation is most consistent with acute presentation with potential threat to life or bodily function.  Medical Decision Making Amount and/or Complexity of Data Reviewed Labs: ordered. Radiology: ordered.  Risk Prescription drug management.  HPI/ROS      See MDM section for pertinent HPI and ROS. A complete ROS was performed with pertinent positives/negatives  noted above.   Past Medical History:  Diagnosis Date   Anxiety    Bladder cystocele    Breast cancer (HCC)    s/p L breast cancer removal   Bundle branch block, left    diagnosed 03/2012 followed by cath showing only minimal CAD   Bursitis    Cancer (HCC)    right breast   Capsulitis    CHF (congestive heart failure) (HCC)    Diverticulosis    Elevated liver enzymes 2007   History of IBS    Hyperlipidemia    Hypertension    Nonischemic cardiomyopathy (HCC)    pernicious anemia     Past Surgical History:  Procedure Laterality Date   ANKLE FRACTURE SURGERY     BIV ICD INSERTION CRT-D N/A 01/03/2018    [facility]. [employer] [place] [employer] MP model [id number] (serial  Number [phone]) biventricular ICD for primary prevention of sudden death   BREAST BIOPSY     right breast   BREAST RECONSTRUCTION WITH PLACEMENT OF TISSUE EXPANDER AND FLEX HD (ACELLULAR HYDRATED DERMIS) Right 01/28/2014   Procedure: RIGHT BREAST RECONSTRUCTION WITH PLACEMENT OF TISSUE EXPANDER;  Surgeon: Etter Sjogren, MD;  Location: [facility] OR;  Service: Plastics;  Laterality: Right;   CARDIAC CATHETERIZATION     x3; 03/18/12 [facility] [place]): 10-20% pLAD, <20% ostial LCX, 20% mPLA, EF 60%.     CHOLECYSTECTOMY     COLONOSCOPY     x4   DILATION AND CURETTAGE OF UTERUS     MASTECTOMY Right    malignant   REDUCTION MAMMAPLASTY Left    RHINOPLASTY     SIMPLE MASTECTOMY WITH AXILLARY SENTINEL NODE BIOPSY Right 01/28/2014   Procedure: RIGHT TOTAL MASTECTOMY WITH  RIGHT AXILLARY SENTINEL NODE BIOPSY;  Surgeon: Glenna Fellows, MD;  Location: [facility] OR;  Service: General;  Laterality: Right;   TONSILLECTOMY AND ADENOIDECTOMY     TUBAL LIGATION        Physical Exam   Vitals:   11/26/22 1607 11/26/22 1608 11/26/22 2100 11/26/22 2103  BP:  125/83 138/72   Pulse: 87 89 85   Resp: 16 16 18    Temp:  98.3 F (36.8 C)  98.2 F (36.8 C)  TempSrc:    Oral  SpO2: 96% 97% 98%     Physical Exam Vitals and nursing  note reviewed.  Constitutional:      General: She is not in acute distress.    Appearance: She is well-developed.  HENT:     Head: Normocephalic and atraumatic.  Eyes:     Conjunctiva/sclera: Conjunctivae normal.  Cardiovascular:     Rate and Rhythm: Normal rate and regular rhythm.     Heart sounds: No murmur heard. Pulmonary:     Effort: Pulmonary effort is normal. No respiratory distress.     Breath sounds: Normal breath sounds. No decreased breath sounds, wheezing or rhonchi.  Abdominal:     Palpations: Abdomen is soft.     Tenderness: There is no abdominal tenderness.  Musculoskeletal:        General: No swelling.     Cervical back: Neck supple.     Right lower leg: No edema.     Left lower leg: No edema.  Skin:    General: Skin is warm and dry.     Capillary Refill: Capillary refill takes less than 2 seconds.  Neurological:     Mental Status: She is alert.  Psychiatric:        Mood and Affect: Mood normal.     Starleen Arms, MD Department of Emergency Medicine   Please note that this documentation was produced with the assistance of voice-to-text technology and may contain errors.    Dyanne Iha, MD 11/26/22 1914    Blane Ohara, MD 11/27/22 2252

## 2022-11-26 NOTE — Telephone Encounter (Signed)
Received call from patient.  She called to provide update that she had Urgent care visit today and was directed to go to ER.  She has a severe UTI and was told could hear something in the lungs.  She was told she may have pneumonia and ER visit to rule out blood clots since she had COVID.  Her husband is driving her to [facility] because she prefers to be where her physician's are located.  Lost connection during conversation.

## 2022-11-26 NOTE — ED Triage Notes (Addendum)
Pt came in via POV from UC d/t hematuria for 2 days & cough that has provider there concerned for possible PNE since she still has intermittent cough post Covid 3 weeks ago. Pt denies CP but endorses some tightness in her chest & exertional SOB (Hx of CHF). A/Ox4, rates her current pain 2/10 while in triage.

## 2022-11-26 NOTE — Discharge Instructions (Signed)
You were seen today for urinary symptoms, shortness of breath, fevers, concern for pulmonary embolism. While you were here we monitored your vitals, preformed a physical exam, and checked blood work and imaging as well as urine. These were all reassuring and there is no indication for any further testing or intervention in the emergency department at this time.  We did see evidence of a urinary tract infection on your urinalysis, and that given you a single dose of antibiotics here.  We have also provided you with a prescription for antibiotics to take at home.  Please be sure to take this medication as prescribed.  You may fill this medication at any pharmacy.  Things to do:  - Follow up with your primary care provider within the next 1-2 weeks -Take your antibiotic as prescribed and make sure you take the full course  Return to the emergency department if you have any new or worsening symptoms including severe chest pain, shortness of breath, nausea/vomiting, abdominal pain, worsening urinary symptoms, or if you have any other concerns.

## 2022-11-27 MED ORDER — CEPHALEXIN 500 MG PO CAPS: 500.0000 mg | ORAL_CAPSULE | Freq: Four times a day (QID) | ORAL | 0 refills | Status: AC

## 2022-11-27 NOTE — Telephone Encounter (Signed)
Spoke with patient and she had ED visit on 8/12 and ruled out any blood clots or pneumonia.  ED confirmed she has UTI and prescribed antibiotics.  She is feeling a little better today and still has a cough after COVID.

## 2022-12-10 ENCOUNTER — Ambulatory Visit: Payer: Federal, State, Local not specified - PPO

## 2022-12-10 DIAGNOSIS — I5022 Chronic systolic (congestive) heart failure: Secondary | ICD-10-CM

## 2022-12-10 DIAGNOSIS — Z95810 Presence of automatic (implantable) cardiac defibrillator: Secondary | ICD-10-CM

## 2022-12-10 NOTE — Progress Notes (Signed)
ADVANCED HF CLINIC NOTE  PCP: Delorse Lek, FNP Primary Cardiologist: Darl Householder HF Cardiologist: Dr. Gala Romney  HPI: Loretta Thomas is a 72 y.o. female with severe HTN, anxiety, LBBB and systolic HF with EF 20-25%. She has had multiple heart catheterizations 2002 [facility]), 2011 [facility]), 2013 [facility]). All showed normal or minimal CAD.  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 No chemoRx or XRT. 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  Developed LV dysfunction in 2019 (I cannot find echo from before them either in our system or at [facility]).    Echo 7/19 revealed EF 15%, mild LVH, global HK, moderate MR. Underwent BivICD in 9/19.   Echo 1/21 EF 20-25% with normal RV and underwent AV optimization  Had PYP scan 07/13/19 read as strongly suggestive of TTR amyloidosis  I saw  -> her and felt like scan was negative for TTR.   She developed a rash with Entresto  Echo 4/21 EF 25-30%  Sleep study 4/21 AHI 7.1  Echo 12/21 EF 25-30%.   Echo 7/22 LVEF 30-35%, Grade 1 DD  Echo 05/07/22 EF 50-55% G1 DD. EF improved with CRT and GDMT.  Seen in ED 8/24, had COVID and + UTI.  Today she returns for HF follow up. Overall feeling fine. Energy has been poor since COVID and UTI. Has not had her CPAP titration study and does not sleep well. She is congested and has mild SOB walking around the house, feels fluid in ears is allergy-related. Occasional positional dizziness, no falls. Denies palpitations, CP, edema, or PND/Orthopnea. Chronically sleeps reclined in loveseat. Appetite ok. No fever or chills. Weight at home 176 pounds. Taking all medications. Down 20 lbs on Mounjaro. Going through a lot of family stressors.  Past Medical History:  Diagnosis Date   Anxiety    Bladder cystocele    Breast cancer [facility])    s/p L breast cancer removal   Bundle branch block, left    diagnosed 03/2012  followed by cath showing only minimal CAD   Bursitis    Cancer (HCC)    right breast   Capsulitis    CHF (congestive heart failure) (HCC)    Diverticulosis    Elevated liver enzymes 2007   History of IBS    Hyperlipidemia    Hypertension    Nonischemic cardiomyopathy (HCC)    pernicious anemia     Current Outpatient Medications  Medication Sig Dispense Refill   acetaminophen (TYLENOL) 500 MG tablet Take 500 mg by mouth every 6 (six) hours as needed.      ALPRAZolam (XANAX) 0.25 MG tablet Take 1 tablet (0.25 mg total) by mouth 2 (two) times daily as needed for anxiety. 30 tablet 0   carvedilol (COREG) 3.125 MG tablet TAKE 1 TABLET BY MOUTH TWICE A DAY WITH A MEAL 180 tablet 2   dapagliflozin propanediol (FARXIGA) 10 MG TABS tablet TAKE 1 TABLET BY MOUTH DAILY BEFORE BREAKFAST. 30 tablet 11   ergocalciferol (VITAMIN D2) 1.25 MG (50000 UT) capsule Take 1 capsule (50,000 Units total) by mouth once a week. 24 capsule 3   fluticasone (FLONASE SENSIMIST) 27.5 MCG/SPRAY nasal spray 27.5 sprays as needed.     furosemide (LASIX) 40 MG tablet TAKE 1 TABLET BY MOUTH EVERY OTHER DAY 45 tablet 3   losartan (COZAAR) 100 MG tablet TAKE 1 TABLET BY MOUTH EVERY DAY 90 tablet 3   MOUNJARO 5 MG/0.5ML Pen Inject  5 mg into the skin once a week.     Multiple Vitamin (MULTIVITAMIN WITH MINERALS) TABS tablet Take 1 tablet by mouth daily.     rosuvastatin (CRESTOR) 5 MG tablet TAKE 1 TABLET BY MOUTH EVERY DAY 90 tablet 3   spironolactone (ALDACTONE) 25 MG tablet TAKE 1 TABLET BY MOUTH EVERY DAY 90 tablet 3   venlafaxine XR (EFFEXOR-XR) 75 MG 24 hr capsule Take 1 capsule (75 mg total) by mouth daily with breakfast. 90 capsule 3   No current facility-administered medications for this encounter.   Allergies  Allergen Reactions   Codeine Nausea Only and Nausea And Vomiting    Stomach Pains   Hydrocodone-Acetaminophen Nausea And Vomiting   Crab Extract Other (See Comments)   Cucumber Extract Other (See  Comments)   Demerol [Meperidine]     Upset stomach   Hydrocodone    Lortab [Hydrocodone-Acetaminophen]     Upset stomach   Mangifera Indica    Other Other (See Comments)   Sacubitril-Valsartan Hives and Itching   Benzocaine Hives and Rash    Social History   Socioeconomic History   Marital status: Married    Spouse name: Not on file   Number of children: Not on file   Years of education: Not on file   Highest education level: Not on file  Occupational History   Not on file  Tobacco Use   Smoking status: Never   Smokeless tobacco: Never  Vaping Use   Vaping status: Never Used  Substance and Sexual Activity   Alcohol use: No   Drug use: No   Sexual activity: Yes  Other Topics Concern   Not on file  Social History Narrative   Lives in [place]   Social Determinants of Health   Financial Resource Strain: Not on file  Food Insecurity: Not on file  Transportation Needs: Not on file  Physical Activity: Not on file  Stress: Not on file  Social Connections: Not on file  Intimate Partner Violence: Not on file   Family History  Problem Relation Age of Onset   Cancer Sister        skin and colon x2   Cancer Brother        esophageal   Cancer Father 18       mouth   Cancer Maternal Aunt 55       breast   Breast cancer Maternal Aunt 55   Cancer Maternal Grandmother 58       ovarian   Cancer Cousin 53       mat 1st cousin with breast   Breast cancer Cousin 64   Cancer Paternal Grandfather 83       lung   BP 108/66   Pulse 84   Wt 80.1 kg (176 lb 9.6 oz)   SpO2 96%   BMI 27.66 kg/m   Wt Readings from Last 3 Encounters:  12/11/22 80.1 kg (176 lb 9.6 oz)  10/12/22 82.6 kg (182 lb 3.2 oz)  06/25/22 84.9 kg (187 lb 3.2 oz)   PHYSICAL EXAM: General:  NAD. No resp difficulty, walked into clinic HEENT: Normal Neck: Supple. No JVD. Carotids 2+ bilat; no bruits. No lymphadenopathy or thryomegaly appreciated. Cor: PMI nondisplaced. Regular rate & rhythm. No  rubs, gallops or murmurs. Lungs: Clear Abdomen: Soft, nontender, nondistended. No hepatosplenomegaly. No bruits or masses. Good bowel sounds. Extremities: No cyanosis, clubbing, rash, edema Neuro: Alert & oriented x 3, cranial nerves grossly intact. Moves all 4 extremities w/o  difficulty. Affect pleasant.  Device interrogation (personally reviewed): CorVue stable, > 99% BIV pacing, 2-4 hr/day activity  ASSESSMENT & PLAN:  1. Chronic systolic HF  - due to presumed NICM - multiple cardiac caths with no CAD (last 2013) - EF 15% in 7/19 felt due to LBBB - s/p St Jude CRT-D in 9/19 - Echo 1/21 EF 25%. 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 SPEP negative (Dr. Gala Romney thought it was negative) - Echo (12/21): EF 25-30%.  - Echo (7/22): EF 30-35%, Grade 1 DD - Echo (05/07/22): EF 50-55% G1 DD -> EF recovered with CRT and GDMT - Improved NYHA IIb, volume OK on exam and by device interrogation. - Continue Lasix 40 mg every other day  - Continue losartan 100 mg daily (rash with Entresto) - Continue carvedilol 3.125 mg bid (Intolerant increased due to fatigue).  - Continue spironolactone 25 mg daily. - Continue Farxiga 10 mg daily. Had UTI a few weeks ago, notify clinic if recurs. - ICD interrogated personally. No VT/AF. BIvpacing 99% Volume ok . - Feel PYP scan is equivocal and based on echo findings (completely normal RV) I do not think she has cardiac amyloid or at least not severe enough to cause this degree of LV dysfunction. She has undergone AV optimization - Myeloma panel negative. Genetic testing negative  - Previously discussed Barostim. Insurance wouldn't cover - Recent labs reviewed and are stable, K 4.0, SCr 0.88 - Repeat echo in 6 months to ensure EF stable.  2. LBBB - s/p CRT-D - ICD interrogated as above  3. OSA - Repeat sleep study 12/23 + for OSA - Needs CPAP titration study - Follows with Dr. Mayford Knife, will send office  message.  4. Breast Cancer - Treated with surgery and letrazole (no chemo or XRT)  5. Hyperlipdemia  - Continue statin. Myalgias improved with CoQ 10 - No change today - Followed by PCP  6. Overweight - Body mass index is 27.66 kg/m. - Now on Mounjaro, down 20 lbs. Congratulated.  Follow up in 6 months with Dr. Gala Romney + echo  Jacklynn Ganong, FNP  3:52 PM

## 2022-12-11 ENCOUNTER — Encounter (HOSPITAL_COMMUNITY): Payer: Self-pay

## 2022-12-11 ENCOUNTER — Ambulatory Visit (HOSPITAL_COMMUNITY)
Admission: RE | Admit: 2022-12-11 | Discharge: 2022-12-11 | Disposition: A | Discharge status: Home / Self Care | Payer: Federal, State, Local not specified - PPO | Source: Ambulatory Visit | Attending: Family Medicine | Admitting: Family Medicine

## 2022-12-11 VITALS — BP 108/66 | HR 84 | Wt 176.6 lb

## 2022-12-11 DIAGNOSIS — Z853 Personal history of malignant neoplasm of breast: Secondary | ICD-10-CM

## 2022-12-11 DIAGNOSIS — E663 Overweight: Secondary | ICD-10-CM

## 2022-12-11 DIAGNOSIS — Z9011 Acquired absence of right breast and nipple: Secondary | ICD-10-CM | POA: Insufficient documentation

## 2022-12-11 DIAGNOSIS — I5022 Chronic systolic (congestive) heart failure: Secondary | ICD-10-CM | POA: Diagnosis not present

## 2022-12-11 DIAGNOSIS — Z79899 Other long term (current) drug therapy: Secondary | ICD-10-CM | POA: Diagnosis not present

## 2022-12-11 DIAGNOSIS — F419 Anxiety disorder, unspecified: Secondary | ICD-10-CM | POA: Insufficient documentation

## 2022-12-11 DIAGNOSIS — G4733 Obstructive sleep apnea (adult) (pediatric): Secondary | ICD-10-CM

## 2022-12-11 DIAGNOSIS — Z6827 Body mass index (BMI) 27.0-27.9, adult: Secondary | ICD-10-CM | POA: Diagnosis not present

## 2022-12-11 DIAGNOSIS — C9110 Chronic lymphocytic leukemia of B-cell type not having achieved remission: Secondary | ICD-10-CM | POA: Insufficient documentation

## 2022-12-11 DIAGNOSIS — E785 Hyperlipidemia, unspecified: Secondary | ICD-10-CM | POA: Diagnosis not present

## 2022-12-11 DIAGNOSIS — C50919 Malignant neoplasm of unspecified site of unspecified female breast: Secondary | ICD-10-CM | POA: Diagnosis not present

## 2022-12-11 DIAGNOSIS — Z79811 Long term (current) use of aromatase inhibitors: Secondary | ICD-10-CM | POA: Diagnosis not present

## 2022-12-11 DIAGNOSIS — Z638 Other specified problems related to primary support group: Secondary | ICD-10-CM | POA: Insufficient documentation

## 2022-12-11 DIAGNOSIS — M791 Myalgia, unspecified site: Secondary | ICD-10-CM | POA: Diagnosis not present

## 2022-12-11 DIAGNOSIS — I110 Hypertensive heart disease with heart failure: Secondary | ICD-10-CM | POA: Insufficient documentation

## 2022-12-11 DIAGNOSIS — Z95810 Presence of automatic (implantable) cardiac defibrillator: Secondary | ICD-10-CM | POA: Diagnosis not present

## 2022-12-11 DIAGNOSIS — I447 Left bundle-branch block, unspecified: Secondary | ICD-10-CM | POA: Diagnosis not present

## 2022-12-11 DIAGNOSIS — Z87440 Personal history of urinary (tract) infections: Secondary | ICD-10-CM | POA: Insufficient documentation

## 2022-12-11 NOTE — Patient Instructions (Signed)
Medication Changes:  No Changes In Medications at this time.   Testing/Procedures:  Your physician has requested that you have an echocardiogram IN 6 MONTHS. Echocardiography is a painless test that uses sound waves to create images of your heart. It provides your doctor with information about the size and shape of your heart and how well your heart's chambers and valves are working. This procedure takes approximately one hour. There are no restrictions for this procedure. Please do NOT wear cologne, perfume, aftershave, or lotions (deodorant is allowed). Please arrive 15 minutes prior to your appointment time.  Special Instructions // Education:  PLEASE CALL DR. Norris Cross OFFICE TO GET SCHEDULED FOR CPAP TITRATION   Follow-Up in: IN 6 MONTHS. PLEASE CALL OUR OFFICE AROUND DECEMBER TO GET SCHEDULED FOR YOUR APPOINTMENT. PHONE NUMBER IS [phone] OPTION 2    At the [facility], you and your health needs are our priority. We have a designated team specialized in the treatment of Heart Failure. This Care Team includes your primary Heart Failure Specialized Cardiologist (physician), Advanced Practice Providers (APPs- Physician Assistants and Nurse Practitioners), and Pharmacist who all work together to provide you with the care you need, when you need it.   You may see any of the following providers on your designated Care Team at your next follow up:  Dr. Arvilla Meres Dr. Marca Ancona Dr. Marcos Eke, NP Robbie Lis, [place] [facility] [place], [place] Brynda Peon, NP Karle Plumber, PharmD   Please be sure to bring in all your medications bottles to every appointment.   Need to Contact us:  If you have any questions or concerns before your next appointment please send [place] a message through [place] or call our office at [phone].    TO LEAVE A MESSAGE FOR THE NURSE SELECT OPTION 2, PLEASE LEAVE A MESSAGE INCLUDING: YOUR NAME DATE OF  BIRTH CALL BACK NUMBER REASON FOR CALL**this is important as we prioritize the call backs  YOU WILL RECEIVE A CALL BACK THE SAME DAY AS LONG AS YOU CALL BEFORE 4:00 PM

## 2022-12-13 NOTE — Progress Notes (Signed)
EPIC Encounter for ICM Monitoring  Patient Name: Loretta Thomas is a 72 y.o. female Date: 12/13/2022 Primary Care Physican: Delorse Lek, FNP Primary Cardiologist: Bensimhon Electrophysiologist: Mealor Bi-V Pacing:   >99%       06/25/2022 Office 187 lbs 12/13/2022 Weight: 176 lbs      AT/AF Burden: 0%                                              Spoke with patient and heart failure questions reviewed.  Transmission results reviewed.  Pt asymptomatic for fluid accumulation.  Reports feeling well at this time and voices no complaints.      Diet:  Does not follow low salt diet.       Corvue thoracic impedance suggesting normal fluid levels within the last month.   Prescribed:  Furosemide 40 mg take 1 tablet every other day.  She self adjusts Furosemide as needed.   Spironolactone 25 mg take 0.5 tablet (12.5 mg total) by mouth daily Farxiga 10mg  take 1 tablet by mouth daily    Labs: 11/26/2022 Creatinine 0.88, BUN 14, Potassium 4.0, Sodium 137, GFR >60 05/07/2022 Creatinine 0.79, BUN 10, Potassium 3.9, Sodium 141, GFR >60 A complete set of results can be found in Results Review.   Recommendations:  No changes and encouraged to call if experiencing any fluid symptoms.   Follow-up plan: ICM clinic phone appointment on 01/21/2023.   91 day device clinic remote transmission 01/01/2023.     EP/Cardiology Office Visits:   Recall 10/07/2023 with Dr Nelly Laurence.   Recall 06/09/2023 with Dr Gala Romney.    Copy of ICM check sent to Dr Nelly Laurence.     3 month ICM trend: 12/10/2022.    12-14 Month ICM trend:     Karie Soda, RN 12/13/2022 3:01 PM

## 2022-12-26 ENCOUNTER — Telehealth (HOSPITAL_COMMUNITY): Payer: Self-pay | Admitting: Cardiology

## 2022-12-26 IMAGING — MG DIGITAL SCREENING UNILAT LEFT W/ TOMO W/ CAD
6 series · 6 of 18 positions shown · non-contrast
Comparison: Previous exam(s).

CLINICAL DATA: Screening.

EXAM:
DIGITAL SCREENING UNILATERAL LEFT MAMMOGRAM WITH CAD AND
TOMOSYNTHESIS
TECHNIQUE: Left screening digital craniocaudal and mediolateral oblique
mammograms were obtained. Left screening digital breast
tomosynthesis was performed. The images were evaluated with
computer-aided detection.

[L MLO synth-2D (1 of 2)]
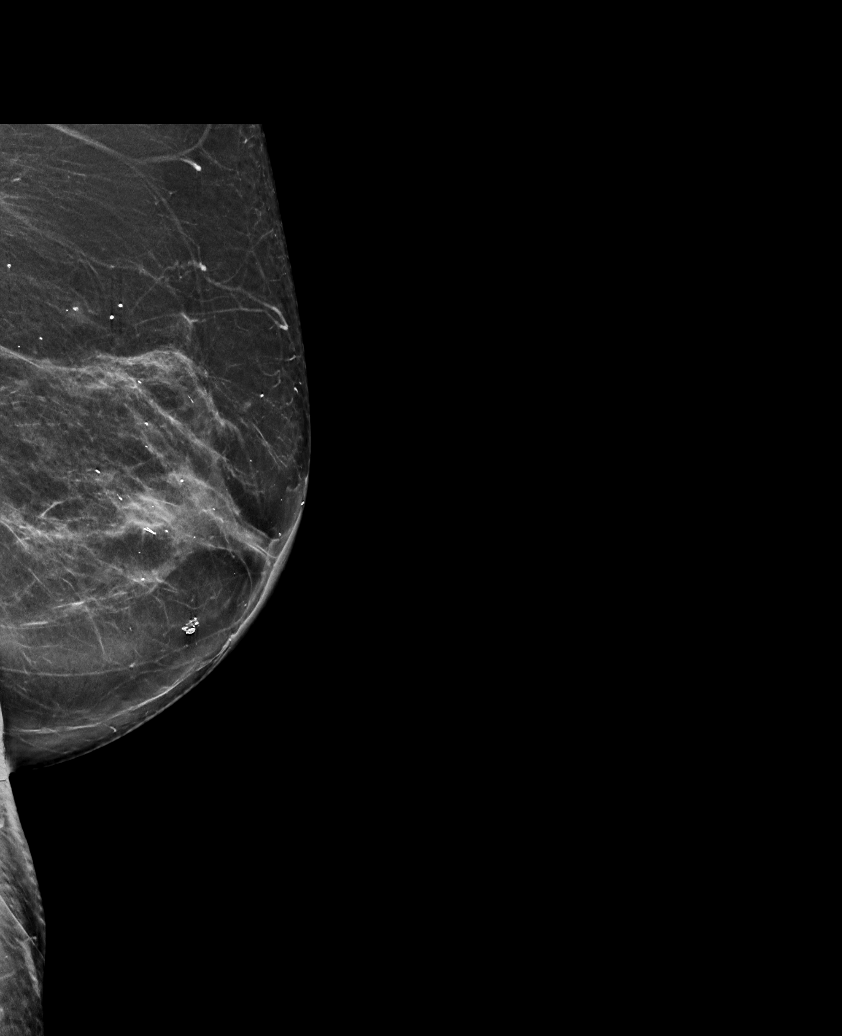

[L MLO synth-2D (2 of 2)]
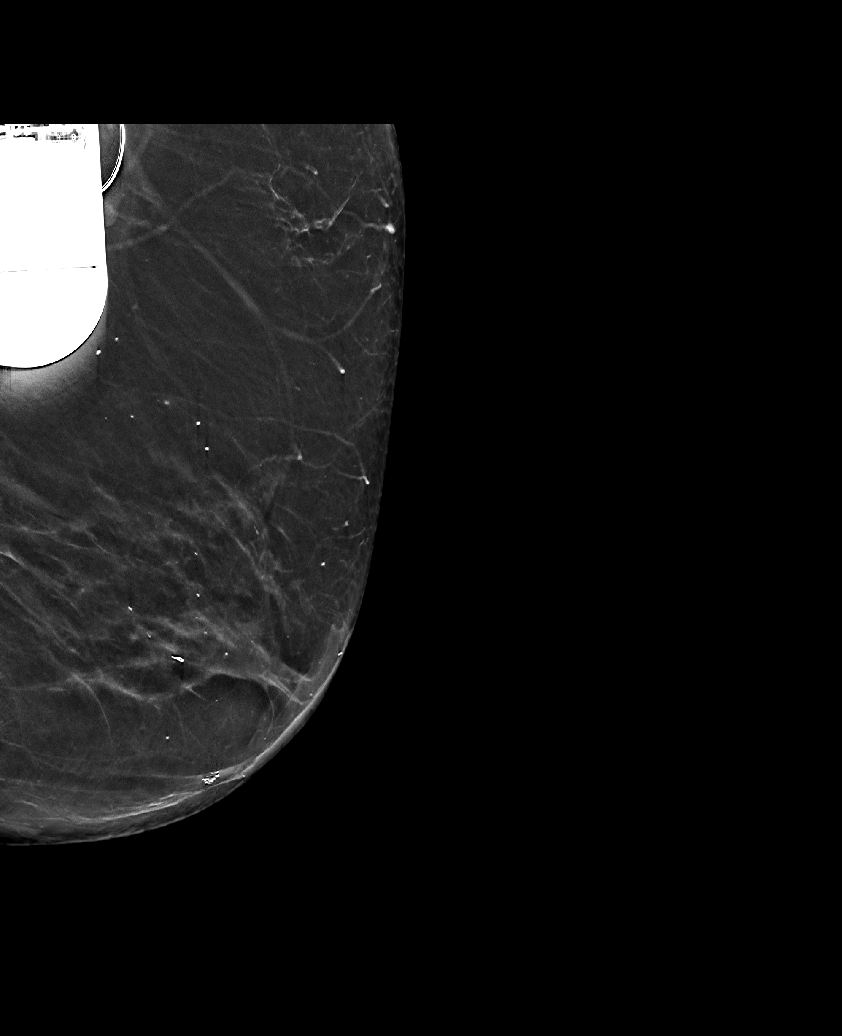

[L CC synth-2D]
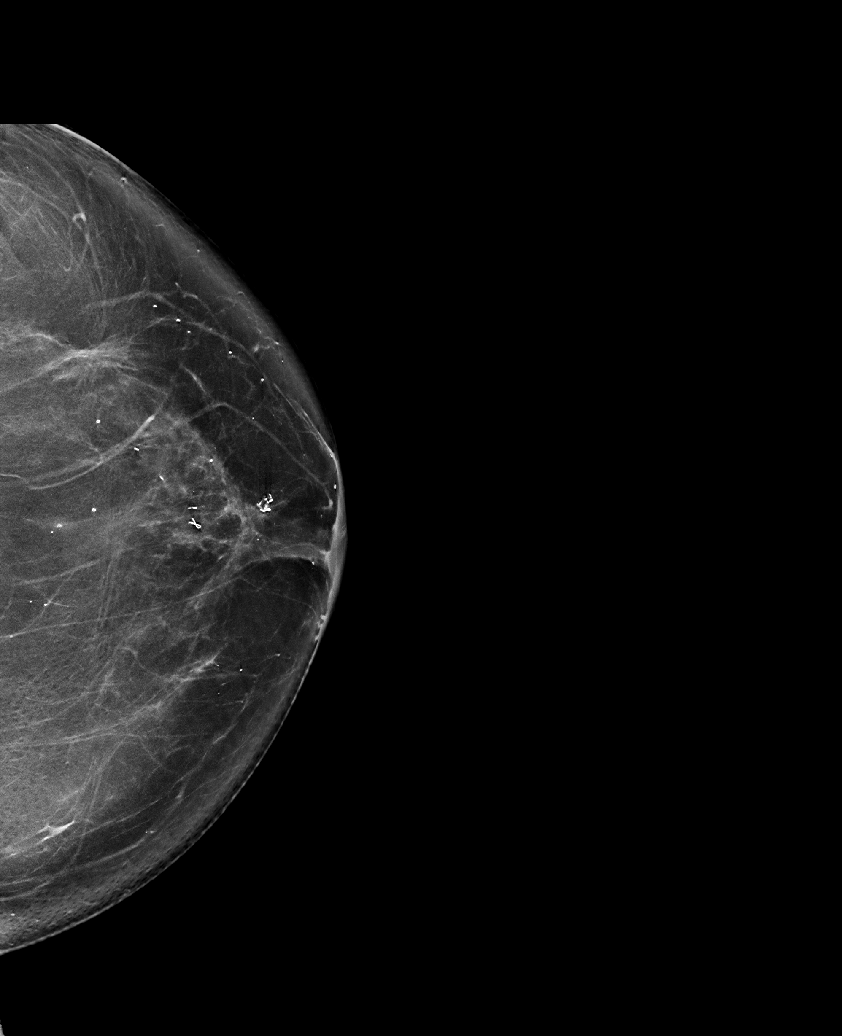

[L MLO tomo (1 of 2) · tomo slice 43/86.0]
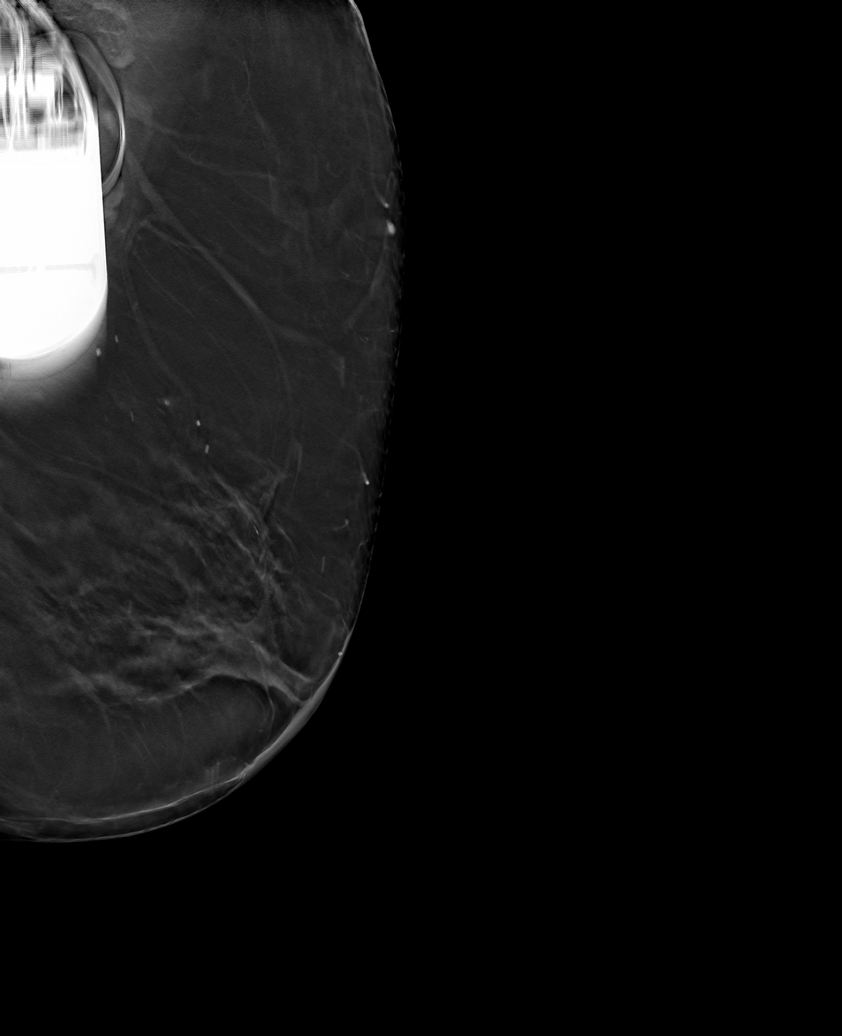

[L CC tomo · tomo slice 42/83.0]
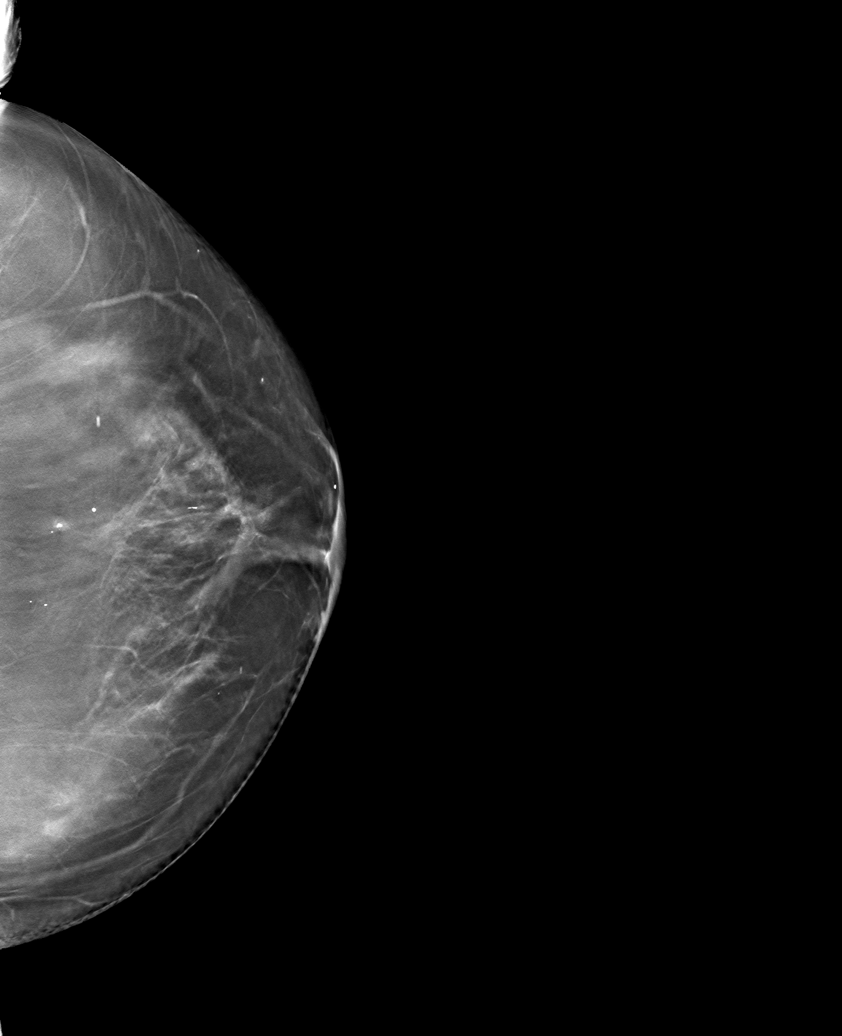

[L MLO tomo (2 of 2) · tomo slice 39/77.0]
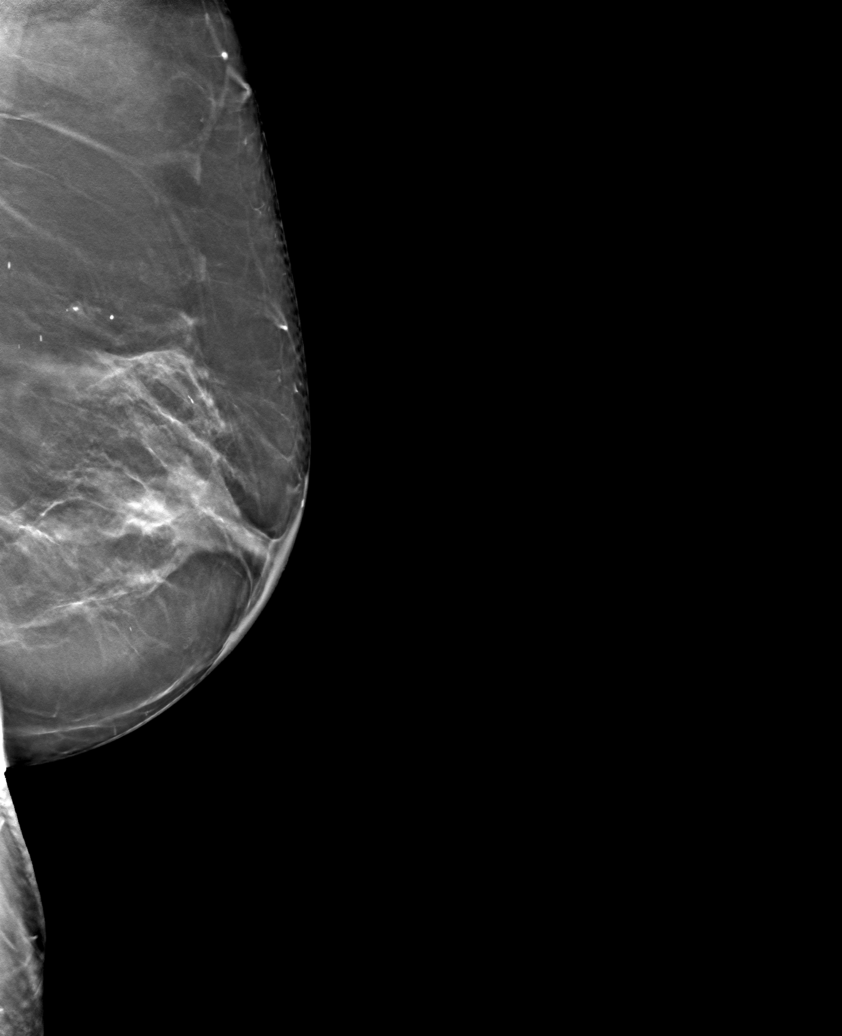

[6 of 18 positions shown; findings below may reference images not displayed]

ACR Breast Density Category c: The breast tissue is heterogeneously
dense, which may obscure small masses.
FINDINGS: In the left breast, possible distortion warrants further evaluation.
The patient is had a right mastectomy. The images were evaluated
with computer-aided detection.
IMPRESSION: Further evaluation is suggested for possible distortion in the left
breast.

RECOMMENDATION:
Diagnostic mammogram and possibly ultrasound of the left breast.
(Code:[id number])

The patient will be contacted regarding the findings, and additional
imaging will be scheduled.

BI-RADS CATEGORY  0: Incomplete. Need additional imaging evaluation
and/or prior mammograms for comparison.

## 2022-12-26 NOTE — Telephone Encounter (Signed)
Pt called to report she was seen in urgent care 9/10 for another uti  This would make second uti since starting farxiga   Advised to hold farxiga until further medication recommendations are given by provider

## 2022-12-31 NOTE — Telephone Encounter (Signed)
Patient advised and verbalized understanding

## 2023-01-01 ENCOUNTER — Encounter: Payer: Self-pay | Admitting: Internal Medicine

## 2023-01-01 ENCOUNTER — Ambulatory Visit (INDEPENDENT_AMBULATORY_CARE_PROVIDER_SITE_OTHER): Payer: Federal, State, Local not specified - PPO

## 2023-01-01 DIAGNOSIS — I428 Other cardiomyopathies: Secondary | ICD-10-CM

## 2023-01-01 DIAGNOSIS — I5022 Chronic systolic (congestive) heart failure: Secondary | ICD-10-CM | POA: Diagnosis not present

## 2023-01-01 LAB — CUP PACEART REMOTE DEVICE CHECK
Battery Remaining Longevity: 25 mo
Battery Remaining Percentage: 33 %
Battery Voltage: 2.9 V
Brady Statistic AP VP Percent: 1 %
Brady Statistic AP VS Percent: 0 %
Brady Statistic AS VP Percent: 99 %
Brady Statistic AS VS Percent: 1 %
Brady Statistic RA Percent Paced: 1 %
Date Time Interrogation Session: 20240917020014
HighPow Impedance: 79 Ohm
HighPow Impedance: 79 Ohm
Implantable Lead Connection Status: 753985
Implantable Lead Connection Status: 753985
Implantable Lead Connection Status: 753985
Implantable Lead Implant Date: 20190920
Implantable Lead Implant Date: 20190920
Implantable Lead Implant Date: 20190920
Implantable Lead Location: 753858
Implantable Lead Location: 753859
Implantable Lead Location: 753860
Implantable Pulse Generator Implant Date: 20190920
Lead Channel Impedance Value: 1025 Ohm
Lead Channel Impedance Value: 390 Ohm
Lead Channel Impedance Value: 610 Ohm
Lead Channel Pacing Threshold Amplitude: 0.5 V
Lead Channel Pacing Threshold Amplitude: 0.75 V
Lead Channel Pacing Threshold Amplitude: 0.875 V
Lead Channel Pacing Threshold Pulse Width: 0.5 ms
Lead Channel Pacing Threshold Pulse Width: 0.5 ms
Lead Channel Pacing Threshold Pulse Width: 0.5 ms
Lead Channel Sensing Intrinsic Amplitude: 11.8 mV
Lead Channel Sensing Intrinsic Amplitude: 3.7 mV
Lead Channel Setting Pacing Amplitude: 2 V
Lead Channel Setting Pacing Amplitude: 2 V
Lead Channel Setting Pacing Amplitude: 2 V
Lead Channel Setting Pacing Pulse Width: 0.5 ms
Lead Channel Setting Pacing Pulse Width: 0.5 ms
Lead Channel Setting Sensing Sensitivity: 0.5 mV
Pulse Gen Serial Number: 9833194
Zone Setting Status: 755011

## 2023-01-09 ENCOUNTER — Telehealth: Payer: Self-pay

## 2023-01-09 NOTE — Telephone Encounter (Signed)
Returned call to patient as requested by voice mail message.  Pt sent remote transmission to review fluid levels.  Her sister passed away and brother had stroke in the past week.  Her brother is not expected to live.  She has little swelling in feet.  She self adjusts Furosemide as needed.  She will take 1 Lasix 30 mg x 3-4 days consecutive days depending on symptoms if a 4 day is needed then return to every other day.  01/09/2023 Corvue thoracic impedance suggesting possible fluid accumulation starting 9/22.

## 2023-01-10 ENCOUNTER — Ambulatory Visit: Payer: Federal, State, Local not specified - PPO | Attending: Cardiovascular Disease

## 2023-01-10 DIAGNOSIS — I5022 Chronic systolic (congestive) heart failure: Secondary | ICD-10-CM

## 2023-01-10 DIAGNOSIS — Z95810 Presence of automatic (implantable) cardiac defibrillator: Secondary | ICD-10-CM

## 2023-01-10 NOTE — Progress Notes (Signed)
EPIC Encounter for ICM Monitoring  Patient Name: Loretta Thomas is a 72 y.o. female Date: 01/10/2023 Primary Care Physican: Delorse Lek, FNP Primary Cardiologist: Bensimhon Electrophysiologist: Mealor Bi-V Pacing:   >99%       06/25/2022 Office 187 lbs 12/13/2022 Weight: 176 lbs     AT/AF Burden: 0%                                              Pt called on 9/25 stating she was having fluid symptoms due to her sister passed away and brother had a stroke and not expected to live.  Transmission results reviewed.     Diet:  Does not follow low salt diet.       Corvue thoracic impedance suggesting possible fluid accumulation starting 9/22 but returned close to baseline after taking Lasix for 2 consecutive days.    Prescribed:  Furosemide 40 mg take 1 tablet every other day.  She self adjusts Furosemide as needed.   Spironolactone 25 mg take 0.5 tablet (12.5 mg total) by mouth daily Farxiga 10mg  take 1 tablet by mouth daily    Labs: 11/26/2022 Creatinine 0.88, BUN 14, Potassium 4.0, Sodium 137, GFR >60 05/07/2022 Creatinine 0.79, BUN 10, Potassium 3.9, Sodium 141, GFR >60 A complete set of results can be found in Results Review.   Recommendations:  Advised to return to Lasix every other day and limit salt intake.     Follow-up plan: ICM clinic phone appointment on 01/21/2023.   91 day device clinic remote transmission 04/02/2023.     EP/Cardiology Office Visits:   Recall 10/07/2023 with Dr Nelly Laurence.   Recall 06/09/2023 with Dr Gala Romney.    Copy of ICM check sent to Dr Nelly Laurence.     3 month ICM trend: 01/10/2023.    12-14 Month ICM trend:     Karie Soda, RN 01/10/2023 3:07 PM

## 2023-01-17 NOTE — Progress Notes (Signed)
Remote ICD transmission.   

## 2023-01-21 ENCOUNTER — Ambulatory Visit: Payer: Federal, State, Local not specified - PPO

## 2023-01-21 DIAGNOSIS — Z95810 Presence of automatic (implantable) cardiac defibrillator: Secondary | ICD-10-CM

## 2023-01-21 DIAGNOSIS — I5022 Chronic systolic (congestive) heart failure: Secondary | ICD-10-CM

## 2023-01-25 NOTE — Progress Notes (Signed)
EPIC Encounter for ICM Monitoring  Patient Name: Loretta Thomas is a 72 y.o. female Date: 01/25/2023 Primary Care Physican: Delorse Lek, FNP Primary Cardiologist: Bensimhon Electrophysiologist: Mealor Bi-V Pacing:   >99%       06/25/2022 Office 187 lbs 12/13/2022 Weight: 176 lbs  01/25/2023 Weight: 171 lbs    AT/AF Burden: 0%                                              Spoke with patient and heart failure questions reviewed.  Transmission results reviewed.  Pt asymptomatic for fluid accumulation.  Reports feeling well at this time and voices no complaints.     Diet:  Does not follow low salt diet.       Corvue thoracic impedance suggesting fluid levels returned to normal after 9/27.    Prescribed:  Furosemide 40 mg take 1 tablet every other day.  She self adjusts Furosemide as needed.   Spironolactone 25 mg take 0.5 tablet (12.5 mg total) by mouth daily Farxiga 10mg  take 1 tablet by mouth daily    Labs: 11/26/2022 Creatinine 0.88, BUN 14, Potassium 4.0, Sodium 137, GFR >60 05/07/2022 Creatinine 0.79, BUN 10, Potassium 3.9, Sodium 141, GFR >60 A complete set of results can be found in Results Review.   Recommendations:  Left voice mail with ICM number and encouraged to call if experiencing any fluid symptoms.   Follow-up plan: ICM clinic phone appointment on 02/25/2023.   91 day device clinic remote transmission 04/02/2023.     EP/Cardiology Office Visits:   Recall 10/07/2023 with Dr Nelly Laurence.   Recall 06/09/2023 with Dr Gala Romney.    Copy of ICM check sent to Dr Nelly Laurence.   3 month ICM trend: 01/21/2023.    12-14 Month ICM trend:     Karie Soda, RN 01/25/2023 2:35 PM

## 2023-02-11 ENCOUNTER — Telehealth: Payer: Self-pay

## 2023-02-11 NOTE — Telephone Encounter (Signed)
Attempted return ICM Call as requested by voice mail message.  Pt send device remote transmission on 10/25 for review.  Left detailed message per DPR regarding transmission results.    02/08/2023 CorVue thoracic impedance suggesting possible dryness starting 10/18 and returning to normal 10/25.

## 2023-02-13 ENCOUNTER — Telehealth: Payer: Self-pay

## 2023-02-13 NOTE — Telephone Encounter (Signed)
Returned patient call as requested voice mail message regarding 10/25 transmission results.  Advised that 02/08/2023 CorVue thoracic impedance suggesting possible dryness starting 10/18 and returning to normal 10/25.   She is feeling okay and staying with her nephew at [facility] hospital due to he is getting Leukemia treatment.    Advised to call back if needed for any further concerns.

## 2023-03-04 ENCOUNTER — Ambulatory Visit: Payer: Federal, State, Local not specified - PPO | Attending: Cardiovascular Disease

## 2023-03-04 DIAGNOSIS — Z95810 Presence of automatic (implantable) cardiac defibrillator: Secondary | ICD-10-CM

## 2023-03-04 DIAGNOSIS — I5022 Chronic systolic (congestive) heart failure: Secondary | ICD-10-CM

## 2023-03-05 NOTE — Progress Notes (Signed)
EPIC Encounter for ICM Monitoring  Patient Name: Loretta Thomas is a 72 y.o. female Date: 03/05/2023 Primary Care Physican: Delorse Lek, FNP Primary Cardiologist: Bensimhon Electrophysiologist: Mealor Bi-V Pacing:   >99%       06/25/2022 Office 187 lbs 12/13/2022 Weight: 176 lbs  01/25/2023 Weight: 171 lbs    AT/AF Burden: 0%                                              Spoke with patient and heart failure questions reviewed.  Transmission results reviewed.  Pt asymptomatic for fluid accumulation.  Reports feeling well at this time and voices no complaints.     Diet:  Does not follow low salt diet.       Corvue thoracic impedance suggesting normal fluid levels since 11/4.    Prescribed:  Furosemide 40 mg take 1 tablet every other day.  She self adjusts Furosemide as needed.   Spironolactone 25 mg take 0.5 tablet (12.5 mg total) by mouth daily Farxiga 10mg  take 1 tablet by mouth daily    Labs: 11/26/2022 Creatinine 0.88, BUN 14, Potassium 4.0, Sodium 137, GFR >60 05/07/2022 Creatinine 0.79, BUN 10, Potassium 3.9, Sodium 141, GFR >60 A complete set of results can be found in Results Review.   Recommendations:  No changes and encouraged to call if experiencing any fluid symptoms.   Follow-up plan: ICM clinic phone appointment on 04/08/2023.   91 day device clinic remote transmission 04/02/2023.     EP/Cardiology Office Visits:   Recall 10/07/2023 with Dr Nelly Laurence.   Recall 06/09/2023 with Dr Gala Romney.    Copy of ICM check sent to Dr Nelly Laurence.   3 month ICM trend: 03/04/2023.    12-14 Month ICM trend:     Karie Soda, RN 03/05/2023 9:34 AM

## 2023-04-02 ENCOUNTER — Ambulatory Visit (INDEPENDENT_AMBULATORY_CARE_PROVIDER_SITE_OTHER): Payer: Federal, State, Local not specified - PPO

## 2023-04-02 DIAGNOSIS — I5022 Chronic systolic (congestive) heart failure: Secondary | ICD-10-CM | POA: Diagnosis not present

## 2023-04-02 DIAGNOSIS — I428 Other cardiomyopathies: Secondary | ICD-10-CM

## 2023-04-03 LAB — CUP PACEART REMOTE DEVICE CHECK
Battery Remaining Longevity: 23 mo
Battery Remaining Percentage: 30 %
Battery Voltage: 2.89 V
Brady Statistic AP VP Percent: 1 %
Brady Statistic AP VS Percent: 0 %
Brady Statistic AS VP Percent: 99 %
Brady Statistic AS VS Percent: 1 %
Brady Statistic RA Percent Paced: 1 %
Date Time Interrogation Session: 20241217020023
HighPow Impedance: 74 Ohm
HighPow Impedance: 74 Ohm
Implantable Lead Connection Status: 753985
Implantable Lead Connection Status: 753985
Implantable Lead Connection Status: 753985
Implantable Lead Implant Date: 20190920
Implantable Lead Implant Date: 20190920
Implantable Lead Implant Date: 20190920
Implantable Lead Location: 753858
Implantable Lead Location: 753859
Implantable Lead Location: 753860
Implantable Pulse Generator Implant Date: 20190920
Lead Channel Impedance Value: 380 Ohm
Lead Channel Impedance Value: 600 Ohm
Lead Channel Impedance Value: 940 Ohm
Lead Channel Pacing Threshold Amplitude: 0.5 V
Lead Channel Pacing Threshold Amplitude: 0.875 V
Lead Channel Pacing Threshold Amplitude: 0.875 V
Lead Channel Pacing Threshold Pulse Width: 0.5 ms
Lead Channel Pacing Threshold Pulse Width: 0.5 ms
Lead Channel Pacing Threshold Pulse Width: 0.5 ms
Lead Channel Sensing Intrinsic Amplitude: 11.8 mV
Lead Channel Sensing Intrinsic Amplitude: 3.2 mV
Lead Channel Setting Pacing Amplitude: 2 V
Lead Channel Setting Pacing Amplitude: 2 V
Lead Channel Setting Pacing Amplitude: 2 V
Lead Channel Setting Pacing Pulse Width: 0.5 ms
Lead Channel Setting Pacing Pulse Width: 0.5 ms
Lead Channel Setting Sensing Sensitivity: 0.5 mV
Pulse Gen Serial Number: 9833194
Zone Setting Status: 755011

## 2023-04-08 ENCOUNTER — Ambulatory Visit: Payer: Federal, State, Local not specified - PPO | Attending: Cardiovascular Disease

## 2023-04-08 DIAGNOSIS — Z95810 Presence of automatic (implantable) cardiac defibrillator: Secondary | ICD-10-CM

## 2023-04-08 DIAGNOSIS — I5022 Chronic systolic (congestive) heart failure: Secondary | ICD-10-CM

## 2023-04-12 ENCOUNTER — Other Ambulatory Visit (HOSPITAL_COMMUNITY): Payer: Self-pay

## 2023-04-12 DIAGNOSIS — I5022 Chronic systolic (congestive) heart failure: Secondary | ICD-10-CM

## 2023-04-12 MED ORDER — CARVEDILOL 3.125 MG PO TABS: 3.1250 mg | ORAL_TABLET | Freq: Two times a day (BID) | ORAL | 3 refills | Status: DC

## 2023-04-12 MED ORDER — FUROSEMIDE 40 MG PO TABS: 40.0000 mg | ORAL_TABLET | ORAL | 3 refills | Status: DC

## 2023-04-12 MED ORDER — SPIRONOLACTONE 25 MG PO TABS: 25.0000 mg | ORAL_TABLET | Freq: Every day | ORAL | 3 refills | Status: DC

## 2023-04-12 MED ORDER — ROSUVASTATIN CALCIUM 5 MG PO TABS: 5.0000 mg | ORAL_TABLET | Freq: Every day | ORAL | 3 refills | Status: DC

## 2023-04-12 MED ORDER — LOSARTAN POTASSIUM 100 MG PO TABS: 100.0000 mg | ORAL_TABLET | Freq: Every day | ORAL | 3 refills | Status: DC

## 2023-04-12 MED ORDER — DAPAGLIFLOZIN PROPANEDIOL 10 MG PO TABS: 10.0000 mg | ORAL_TABLET | Freq: Every day | ORAL | 11 refills | Status: DC

## 2023-04-12 NOTE — Progress Notes (Signed)
EPIC Encounter for ICM Monitoring  Patient Name: Loretta Thomas is a 72 y.o. female Date: 04/12/2023 Primary Care Physican: Delorse Lek, FNP Primary Cardiologist: Bensimhon Electrophysiologist: Mealor Bi-V Pacing:   >99%       06/25/2022 Office 187 lbs 12/13/2022 Weight: 176 lbs  01/25/2023 Weight: 171 lbs 04/12/2023 Weight: 170 lbs    AT/AF Burden: 0%                                              Spoke with patient and heart failure questions reviewed.  Transmission results reviewed.  Pt asymptomatic for fluid accumulation.  Reports feeling well at this time and voices no complaints.     Diet:  Does not follow low salt diet.       Corvue thoracic impedance suggesting normal fluid levels with the exception of possible fluid accumulation from 12/8-12/13 and 12/17-12/21.   Prescribed:  Furosemide 40 mg take 1 tablet every other day.  She self adjusts Furosemide as needed.   Spironolactone 25 mg take 0.5 tablet (12.5 mg total) by mouth daily Farxiga 10mg  take 1 tablet by mouth daily    Labs: 11/26/2022 Creatinine 0.88, BUN 14, Potassium 4.0, Sodium 137, GFR >60 05/07/2022 Creatinine 0.79, BUN 10, Potassium 3.9, Sodium 141, GFR >60 A complete set of results can be found in Results Review.   Recommendations:  No changes and encouraged to call if experiencing any fluid symptoms.   Follow-up plan: ICM clinic phone appointment on 05/13/2023.   91 day device clinic remote transmission 07/02/2023.     EP/Cardiology Office Visits:   Recall 10/07/2023 with Dr Nelly Laurence.   Recall 06/09/2023 with Dr Gala Romney.    Copy of ICM check sent to Dr Nelly Laurence.   3 month ICM trend: 04/08/2023.    12-14 Month ICM trend:     Karie Soda, RN 04/12/2023 2:18 PM

## 2023-04-22 NOTE — Progress Notes (Signed)
 [facility] CLINIC NOTE  PCP: Almeda Loa ORN, FNP Primary Cardiologist: [facility] Cardiologist: Dr. Cherrie  HPI: Loretta Thomas is a 73 y.o. female with severe HTN, anxiety, LBBB and systolic HF with EF 20-25%. She has had multiple heart catheterizations 2002 [facility]), 2011 [facility]), 2013 [facility]). All showed normal or minimal CAD.  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 No chemoRx or XRT. 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  Developed LV dysfunction in 2019 (I cannot find echo from before them either in our system or at [facility]).    Echo 7/19 revealed EF 15%, mild LVH, global HK, moderate MR. Underwent BivICD in 9/19.   Echo 1/21 EF 20-25% with normal RV and underwent AV optimization  Had PYP scan 07/13/19 read as strongly suggestive of TTR amyloidosis  I saw  -> her and felt like scan was negative for TTR.   She developed a rash with Entresto   Echo 4/21 EF 25-30%  Sleep study 4/21 AHI 7.1  Echo 12/21 EF 25-30%.  Echo 7/22 LVEF 30-35%, Grade 1 DD Echo 05/07/22 EF 50-55% G1 DD. EF improved with CRT and GDMT.  Today she returns for HF follow up. Has been followed by Mitzie Garner RN in [facility]. Volume ok.Has been under a lot off stress. Lost 2 sisters and a brother last year. Nephew also diagnosed with leukemia. Off Farxiga  due to recurrent UTIs. Has lost 30 pounds with Mounjaro. Feels like exercise capacity is better. Can do ADLS without too much problem. SOB if she does too much. No edema, orthopnea or PND.     Past Medical History:  Diagnosis Date   Anxiety    Bladder cystocele    Breast cancer [facility])    s/p L breast cancer removal   Bundle branch block, left    diagnosed 03/2012 followed by cath showing only minimal CAD   Bursitis    Cancer (HCC)    right breast   Capsulitis    CHF (congestive heart failure) (HCC)    Diverticulosis    Elevated liver  enzymes 2007   History of IBS    Hyperlipidemia    Hypertension    Nonischemic cardiomyopathy (HCC)    pernicious anemia     Current Outpatient Medications  Medication Sig Dispense Refill   acetaminophen  (TYLENOL ) 500 MG tablet Take 500 mg by mouth every 6 (six) hours as needed.      ALPRAZolam  (XANAX ) 0.25 MG tablet Take 1 tablet (0.25 mg total) by mouth 2 (two) times daily as needed for anxiety. 30 tablet 0   amoxicillin (AMOXIL) 500 MG capsule Take 500 mg by mouth as needed. Prior to dental appt     carvedilol  (COREG ) 3.125 MG tablet Take 1 tablet (3.125 mg total) by mouth 2 (two) times daily with a meal. 180 tablet 3   ergocalciferol  (VITAMIN D2) 1.25 MG (50000 UT) capsule Take 1 capsule (50,000 Units total) by mouth once a week. 24 capsule 3   fluconazole  (DIFLUCAN ) 150 MG tablet Take 150 mg by mouth as needed.     fluticasone (FLONASE SENSIMIST) 27.5 MCG/SPRAY nasal spray 27.5 sprays as needed.     furosemide  (LASIX ) 40 MG tablet Take 1 tablet (40 mg total) by mouth every other day. 45 tablet 3   loratadine (CLARITIN) 10 MG tablet Take 10 mg by mouth as needed for allergies.     losartan  (COZAAR ) 100 MG tablet  Take 1 tablet (100 mg total) by mouth daily. 90 tablet 3   Multiple Vitamin (MULTIVITAMIN WITH MINERALS) TABS tablet Take 1 tablet by mouth daily.     rosuvastatin  (CRESTOR ) 5 MG tablet Take 1 tablet (5 mg total) by mouth daily. 90 tablet 3   spironolactone  (ALDACTONE ) 25 MG tablet Take 1 tablet (25 mg total) by mouth daily. 90 tablet 3   tirzepatide (MOUNJARO) 7.5 MG/0.5ML Pen Inject 7.5 mg into the skin once a week.     venlafaxine  XR (EFFEXOR -XR) 75 MG 24 hr capsule Take 1 capsule (75 mg total) by mouth daily with breakfast. 90 capsule 3   Vitamin C 250 MG TABS Place 250 mg vaginally daily.     dapagliflozin  propanediol (FARXIGA ) 10 MG TABS tablet Take 1 tablet (10 mg total) by mouth daily before breakfast. (Patient not taking: Reported on 04/23/2023) 30 tablet 11   No  current facility-administered medications for this encounter.   Allergies  Allergen Reactions   Codeine Nausea Only and Nausea And Vomiting    Stomach Pains   Hydrocodone-Acetaminophen  Nausea And Vomiting   Crab Extract Other (See Comments)   Cucumber Extract Other (See Comments)   Demerol [Meperidine]     Upset stomach   Hydrocodone    Lortab [Hydrocodone-Acetaminophen ]     Upset stomach   Mangifera Indica    Other Other (See Comments)   Sacubitril -Valsartan  Hives and Itching   Benzocaine Hives and Rash    Social History   Socioeconomic History   Marital status: Married    Spouse name: Not on file   Number of children: Not on file   Years of education: Not on file   Highest education level: Not on file  Occupational History   Not on file  Tobacco Use   Smoking status: Never   Smokeless tobacco: Never  Vaping Use   Vaping status: Never Used  Substance and Sexual Activity   Alcohol use: No   Drug use: No   Sexual activity: Yes  Other Topics Concern   Not on file  Social History Narrative   Lives in [place]   Social Drivers of Health   Financial Resource Strain: Not on file  Food Insecurity: Not on file  Transportation Needs: Not on file  Physical Activity: Not on file  Stress: Not on file  Social Connections: Not on file  Intimate Partner Violence: Not on file   Family History  Problem Relation Age of Onset   Cancer Sister        skin and colon x2   Cancer Brother        esophageal   Cancer Father 86       mouth   Cancer Maternal Aunt 55       breast   Breast cancer Maternal Aunt 55   Cancer Maternal Grandmother 22       ovarian   Cancer Cousin 53       mat 1st cousin with breast   Breast cancer Cousin 32   Cancer Paternal Grandfather 83       lung   BP 104/68   Pulse 80   Wt 76.1 kg (167 lb 12.8 oz)   SpO2 95%   BMI 26.28 kg/m   Wt Readings from Last 3 Encounters:  04/23/23 76.1 kg (167 lb 12.8 oz)  12/11/22 80.1 kg (176 lb 9.6 oz)   10/12/22 82.6 kg (182 lb 3.2 oz)   PHYSICAL EXAM: General:  Well appearing. No resp  difficulty HEENT: normal Neck: supple. no JVD. Carotids 2+ bilat; no bruits. No lymphadenopathy or thryomegaly appreciated. Cor: PMI nondisplaced. Regular rate & rhythm. No rubs, gallops or murmurs. Lungs: clear Abdomen: soft, nontender, nondistended. No hepatosplenomegaly. No bruits or masses. Good bowel sounds. Extremities: no cyanosis, clubbing, rash, edema Neuro: alert & orientedx3, cranial nerves grossly intact. moves all 4 extremities w/o difficulty. Affect pleasant   Device interrogation (personally reviewed): No VT/AF. > 99% Bivpacing Activity level ~3hr/day volume trending up Personally reviewed   ASSESSMENT & PLAN:  1. Chronic systolic HF  - due to presumed NICM - multiple cardiac caths with no CAD (last 2013) - EF 15% in 7/19 felt due to LBBB - s/p [employer] CRT-D in 9/19 - Echo 1/21 EF 25%. 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 SPEP negative (Dr. Cherrie thought it was negative) - Echo (12/21): EF 25-30%.  - Echo (7/22): EF 30-35%, Grade 1 DD - Echo (05/07/22): EF 50-55% G1 DD -> EF recovered with CRT and GDMT - Stable NYHA II-early III - Has been taking lasix  40 every other day. Now that she is off Farxiga  would increase lasix  to 40 daily  - Continue losartan  100 mg daily (rash with Entresto ) - Continue carvedilol  3.125 mg bid (Intolerant increased due to fatigue).  - Continue spironolactone  25 mg daily. - Off Farxiga  due to recurrent UTIs. Would not restart at this point - ICD interrogated as above - PYP scan was equivocal and based on echo findings (completely normal RV) I do not think she has cardiac amyloid or at least not severe enough to cause this degree of LV dysfunction. Myeloma panel negative. Genetic testing negative  - Previously discussed Barostim. Insurance wouldn't cover - Due for f/u echo at next visit  - labs today   2.  LBBB - s/p CRT-D - > 99% BiV pacing on ICD interrogation toay  3. OSA - Repeat sleep study 12/23 + for OSA - Much improved/resolved with weight loss  4. Breast Cancer - Treated with surgery and letrazole (no chemo or XRT) - no change  5. Hyperlipdemia  - Continue statin. Myalgias improved with CoQ 10 - Follows with PCP   6. Overweight - Body mass index is 26.28 kg/m. - Now on Mounjaro, down 30 lbs. Congratulated.    Toribio Cherrie, MD  12:34 PM

## 2023-04-23 ENCOUNTER — Telehealth: Payer: Self-pay

## 2023-04-23 ENCOUNTER — Encounter (HOSPITAL_COMMUNITY): Payer: Self-pay | Admitting: Internal Medicine

## 2023-04-23 ENCOUNTER — Ambulatory Visit (HOSPITAL_COMMUNITY)
Admission: RE | Admit: 2023-04-23 | Discharge: 2023-04-23 | Disposition: A | Discharge status: Home / Self Care | Payer: Federal, State, Local not specified - PPO | Source: Ambulatory Visit | Attending: Internal Medicine | Admitting: Internal Medicine

## 2023-04-23 VITALS — BP 104/68 | HR 80 | Wt 167.8 lb

## 2023-04-23 DIAGNOSIS — Z634 Disappearance and death of family member: Secondary | ICD-10-CM | POA: Insufficient documentation

## 2023-04-23 DIAGNOSIS — Z4502 Encounter for adjustment and management of automatic implantable cardiac defibrillator: Secondary | ICD-10-CM | POA: Diagnosis not present

## 2023-04-23 DIAGNOSIS — I110 Hypertensive heart disease with heart failure: Secondary | ICD-10-CM | POA: Insufficient documentation

## 2023-04-23 DIAGNOSIS — Z87440 Personal history of urinary (tract) infections: Secondary | ICD-10-CM | POA: Diagnosis not present

## 2023-04-23 DIAGNOSIS — I447 Left bundle-branch block, unspecified: Secondary | ICD-10-CM | POA: Insufficient documentation

## 2023-04-23 DIAGNOSIS — F419 Anxiety disorder, unspecified: Secondary | ICD-10-CM | POA: Insufficient documentation

## 2023-04-23 DIAGNOSIS — Z95810 Presence of automatic (implantable) cardiac defibrillator: Secondary | ICD-10-CM | POA: Diagnosis not present

## 2023-04-23 DIAGNOSIS — Z853 Personal history of malignant neoplasm of breast: Secondary | ICD-10-CM | POA: Diagnosis not present

## 2023-04-23 DIAGNOSIS — Z91014 Allergy to mammalian meats: Secondary | ICD-10-CM | POA: Diagnosis not present

## 2023-04-23 DIAGNOSIS — I5022 Chronic systolic (congestive) heart failure: Secondary | ICD-10-CM | POA: Diagnosis present

## 2023-04-23 DIAGNOSIS — M791 Myalgia, unspecified site: Secondary | ICD-10-CM | POA: Insufficient documentation

## 2023-04-23 DIAGNOSIS — G4733 Obstructive sleep apnea (adult) (pediatric): Secondary | ICD-10-CM | POA: Diagnosis not present

## 2023-04-23 DIAGNOSIS — Z79899 Other long term (current) drug therapy: Secondary | ICD-10-CM | POA: Insufficient documentation

## 2023-04-23 DIAGNOSIS — I2510 Atherosclerotic heart disease of native coronary artery without angina pectoris: Secondary | ICD-10-CM | POA: Diagnosis not present

## 2023-04-23 DIAGNOSIS — E785 Hyperlipidemia, unspecified: Secondary | ICD-10-CM | POA: Insufficient documentation

## 2023-04-23 DIAGNOSIS — Z9011 Acquired absence of right breast and nipple: Secondary | ICD-10-CM | POA: Insufficient documentation

## 2023-04-23 DIAGNOSIS — Z7985 Long-term (current) use of injectable non-insulin antidiabetic drugs: Secondary | ICD-10-CM | POA: Diagnosis not present

## 2023-04-23 DIAGNOSIS — E663 Overweight: Secondary | ICD-10-CM | POA: Diagnosis not present

## 2023-04-23 DIAGNOSIS — Z6826 Body mass index (BMI) 26.0-26.9, adult: Secondary | ICD-10-CM | POA: Diagnosis not present

## 2023-04-23 LAB — BASIC METABOLIC PANEL
Anion gap: 8 (ref 5–15)
BUN: 8 mg/dL (ref 8–23)
CO2: 27 mmol/L (ref 22–32)
Calcium: 9.5 mg/dL (ref 8.9–10.3)
Chloride: 105 mmol/L (ref 98–111)
Creatinine, Ser: 0.76 mg/dL (ref 0.44–1.00)
GFR, Estimated: >60 mL/min (ref >60)
Glucose, Bld: 94 mg/dL (ref 70–99)
Potassium: 4.3 mmol/L (ref 3.5–5.1)
Sodium: 140 mmol/L (ref 135–145)

## 2023-04-23 LAB — BRAIN NATRIURETIC PEPTIDE: B Natriuretic Peptide: 24.5 pg/mL (ref 0.0–100.0)

## 2023-04-23 MED ORDER — FUROSEMIDE 40 MG PO TABS: 40.0000 mg | ORAL_TABLET | Freq: Every day | ORAL | 5 refills | Status: DC

## 2023-04-23 NOTE — Telephone Encounter (Signed)
**Note De-Identified Crews Mccollam Obfuscation** I called the [employer] Emp @ [phone] and started a CPAP Titration PA with Amani B. I answered all PA questions and then faxed the pts Stop Bang questionnaire, NPSG from 04/04/22 and Dr Robie recommendation to have a CPAP Titration to [employer] at [phone] and I did receive confirmation that it all faxed successfully.  Per Hermenia, it can take up to 15 days from the time that they receive the pts notes/clinicals from us  for their determination.

## 2023-04-23 NOTE — Addendum Note (Signed)
 Encounter addended by: Baird Cancer, RN on: 04/23/2023 12:51 PM  Actions taken: Clinical Note Signed, Visit diagnoses modified, Order list changed, Diagnosis association updated, Charge Capture section accepted

## 2023-04-23 NOTE — Patient Instructions (Signed)
 Medication Changes:  INCREASE LASIX  (FUROSEMIDE ) TO 40MG  ONCE DAILY   Lab Work:  Labs done today, your results will be available in MyChart, we will contact you for abnormal readings.  Follow-Up in: 6 MONTHS WITH ECHO PLEASE CALL OUR OFFICE AROUND MAY 2025 TO GET SCHEDULED FOR YOUR APPOINTMENT. PHONE NUMBER IS [phone] OPTION 2   At the [facility], you and your health needs are our priority. We have a designated team specialized in the treatment of Heart Failure. This Care Team includes your primary Heart Failure Specialized Cardiologist (physician), Advanced Practice Providers (APPs- Physician Assistants and Nurse Practitioners), and Pharmacist who all work together to provide you with the care you need, when you need it.   You may see any of the following providers on your designated Care Team at your next follow up:  Dr. Toribio Fuel Dr. Ezra Shuck Dr. Ria Commander Dr. Odis Brownie Greig Mosses, NP Caffie Shed, [place] [facility], [place] Beckey Coe, NP Jordan Lee, NP Tinnie Redman, PharmD   Please be sure to bring in all your medications bottles to every appointment.   Need to Contact Us :  If you have any questions or concerns before your next appointment please send us  a message through [place] or call our office at [phone].    TO LEAVE A MESSAGE FOR THE NURSE SELECT OPTION 2, PLEASE LEAVE A MESSAGE INCLUDING: YOUR NAME DATE OF BIRTH CALL BACK NUMBER REASON FOR CALL**this is important as we prioritize the call backs  YOU WILL RECEIVE A CALL BACK THE SAME DAY AS LONG AS YOU CALL BEFORE 4:00 PM

## 2023-04-26 NOTE — Telephone Encounter (Signed)
**Note De-Identified [name] Obfuscation** Letter received Loretta Thomas fax from [facility] stating that they have approved the pts CPAP Titration. Valid dates: 04/23/2023-07/21/2023 Reference #: [id number]

## 2023-04-29 ENCOUNTER — Encounter (HOSPITAL_COMMUNITY): Payer: Federal, State, Local not specified - PPO

## 2023-05-07 ENCOUNTER — Telehealth: Payer: Self-pay

## 2023-05-07 NOTE — Telephone Encounter (Signed)
Spoke with patient and reviewed remote transmission.    She decreased fluid intake. Advised to drink approximately 64 oz fluid daily to help maintain hydration.  ICM remote check scheduled for 1/27.

## 2023-05-07 NOTE — Telephone Encounter (Signed)
Returned call to patient as requested by voice mail message.  Pt has been staying at [facility] hospital with nephew that has leukemia for past 2 weeks.  She is not sure if she has fluid but has been urinating a lot since Dr Gala Romney increased Lasix to daily.  She will send a remote transmission for review.

## 2023-05-10 NOTE — Progress Notes (Signed)
Remote ICD transmission.

## 2023-05-10 NOTE — Addendum Note (Signed)
Addended by: Geralyn Flash D on: 05/10/2023 02:00 PM   Modules accepted: Orders

## 2023-05-13 ENCOUNTER — Ambulatory Visit: Payer: Federal, State, Local not specified - PPO | Attending: Cardiovascular Disease

## 2023-05-13 DIAGNOSIS — I5022 Chronic systolic (congestive) heart failure: Secondary | ICD-10-CM

## 2023-05-13 DIAGNOSIS — Z95810 Presence of automatic (implantable) cardiac defibrillator: Secondary | ICD-10-CM | POA: Diagnosis not present

## 2023-05-13 NOTE — Progress Notes (Signed)
EPIC Encounter for ICM Monitoring  Patient Name: Loretta Thomas is a 73 y.o. female Date: 05/13/2023 Primary Care Physican: Delorse Lek, FNP Primary Cardiologist: Bensimhon Electrophysiologist: Mealor Bi-V Pacing:   >99%       06/25/2022 Office 187 lbs 12/13/2022 Weight: 176 lbs  01/25/2023 Weight: 171 lbs 04/12/2023 Weight: 170 lbs 05/12/2022 Weight: 170 lbs    AT/AF Burden: 0%                                              Spoke with patient and heart failure questions reviewed.  Transmission results reviewed.  Pt asymptomatic for fluid accumulation.  She has been staying with her nephew at [facility] for several weeks while he receives treatment for Leukemia.  She returns home this week.  She is having to eat all her meals from restaurants which are usually high in salt so could be contributing to fluid accumulation   Diet:  Does not follow low salt diet.       Corvue thoracic impedance suggesting possible fluid accumulation starting 1/25.   Prescribed:  Furosemide 40 mg take 1 tablet every other day.  She self adjusts Furosemide as needed.   Spironolactone 25 mg take 0.5 tablet (12.5 mg total) by mouth daily Farxiga 10mg  take 1 tablet by mouth daily    Labs: 11/26/2022 Creatinine 0.88, BUN 14, Potassium 4.0, Sodium 137, GFR >60 05/07/2022 Creatinine 0.79, BUN 10, Potassium 3.9, Sodium 141, GFR >60 A complete set of results can be found in Results Review.   Recommendations:  She typically self adjust lasix as needed and will be able to resume low salt diet once she returns home.     Follow-up plan: ICM clinic phone appointment on 05/20/2023 to recheck fluid levels.   91 day device clinic remote transmission 07/02/2023.     EP/Cardiology Office Visits:   Recall 10/07/2023 with Dr Nelly Laurence.   Recall 06/09/2023 with Dr Gala Romney.    Copy of ICM check sent to Dr Nelly Laurence.   3 month ICM trend: 05/13/2023.    12-14 Month ICM trend:     Karie Soda, RN 05/13/2023 3:04  PM

## 2023-05-17 ENCOUNTER — Encounter: Payer: Self-pay | Admitting: Internal Medicine

## 2023-05-20 ENCOUNTER — Ambulatory Visit: Payer: Federal, State, Local not specified - PPO | Attending: Cardiovascular Disease

## 2023-05-20 DIAGNOSIS — Z95810 Presence of automatic (implantable) cardiac defibrillator: Secondary | ICD-10-CM

## 2023-05-20 DIAGNOSIS — I5022 Chronic systolic (congestive) heart failure: Secondary | ICD-10-CM

## 2023-05-22 ENCOUNTER — Telehealth: Payer: Self-pay

## 2023-05-22 NOTE — Telephone Encounter (Signed)
 Remote ICM transmission received.  Attempted call to patient regarding ICM remote transmission and left detailed message per DPR.  Left ICM phone number and advised to return call for any fluid symptoms or questions. Next ICM remote transmission scheduled 06/17/2023.

## 2023-05-22 NOTE — Progress Notes (Signed)
EPIC Encounter for ICM Monitoring  Patient Name: Loretta Thomas is a 73 y.o. female Date: 05/22/2023 Primary Care Physican: Delorse Lek, FNP Primary Cardiologist: Bensimhon Electrophysiologist: Mealor Bi-V Pacing:   >99%       06/25/2022 Office 187 lbs 12/13/2022 Weight: 176 lbs  01/25/2023 Weight: 171 lbs 04/12/2023 Weight: 170 lbs 05/12/2022 Weight: 170 lbs    AT/AF Burden: 0%                                              Attempted call to patient and unable to reach.  Left detailed message per DPR regarding transmission.  Transmission results reviewed.    Diet:  Is not strict with dietary salt intake.       Corvue thoracic impedance suggesting fluid levels returned to normal 2/2.   Prescribed:  Furosemide 40 mg take 1 tablet every other day.  She self adjusts Furosemide as needed.   Spironolactone 25 mg take 0.5 tablet (12.5 mg total) by mouth daily Farxiga 10mg  take 1 tablet by mouth daily    Labs: 11/26/2022 Creatinine 0.88, BUN 14, Potassium 4.0, Sodium 137, GFR >60 05/07/2022 Creatinine 0.79, BUN 10, Potassium 3.9, Sodium 141, GFR >60 A complete set of results can be found in Results Review.   Recommendations:  Left voice mail with ICM number and encouraged to call if experiencing any fluid symptoms.   Follow-up plan: ICM clinic phone appointment on 06/17/2023.   91 day device clinic remote transmission 07/02/2023.     EP/Cardiology Office Visits:   Recall 10/07/2023 with Dr Nelly Laurence.   Recall 06/09/2023 with Dr Gala Romney.    Copy of ICM check sent to Dr Nelly Laurence.   3 month ICM trend: 05/21/2023.    12-14 Month ICM trend:     Karie Soda, RN 05/22/2023 8:18 AM

## 2023-06-17 ENCOUNTER — Ambulatory Visit: Payer: Federal, State, Local not specified - PPO | Attending: Cardiovascular Disease

## 2023-06-17 DIAGNOSIS — I5022 Chronic systolic (congestive) heart failure: Secondary | ICD-10-CM

## 2023-06-17 DIAGNOSIS — Z95810 Presence of automatic (implantable) cardiac defibrillator: Secondary | ICD-10-CM

## 2023-06-17 NOTE — Progress Notes (Signed)
 EPIC Encounter for ICM Monitoring  Patient Name: Loretta Thomas is a 73 y.o. female Date: 06/17/2023 Primary Care Physican: Delorse Lek, FNP Primary Cardiologist: Bensimhon Electrophysiologist: Mealor Bi-V Pacing:   >99%       06/25/2022 Office 187 lbs 12/13/2022 Weight: 176 lbs  01/25/2023 Weight: 171 lbs 04/12/2023 Weight: 170 lbs 05/12/2022 Weight: 170 lbs 06/17/2023 Weight: 171 lbs    AT/AF Burden: 0%                                              Spoke with patient and heart failure questions reviewed.  Transmission results reviewed.  Pt reports some chest congestion since 2/28 but otherwise wise without complaints.     Diet:  Is not strict with dietary salt intake.       Corvue thoracic impedance suggesting possible fluid accumulation starting 2/28 and trending back close to baseline.   Prescribed:  Furosemide 40 mg take 1 tablet every other day.  She self adjusts Furosemide as needed.   Spironolactone 25 mg take 0.5 tablet (12.5 mg total) by mouth daily Farxiga 10mg  take 1 tablet by mouth daily    Labs: 11/26/2022 Creatinine 0.88, BUN 14, Potassium 4.0, Sodium 137, GFR >60 05/07/2022 Creatinine 0.79, BUN 10, Potassium 3.9, Sodium 141, GFR >60 A complete set of results can be found in Results Review.   Recommendations:  She self adjusts Lasix as needed.     Follow-up plan: ICM clinic phone appointment on 07/22/2023.   91 day device clinic remote transmission 07/02/2023.     EP/Cardiology Office Visits:   Recall 10/07/2023 with Dr Nelly Laurence.   Recall 06/09/2023 with Dr Gala Romney.    Copy of ICM check sent to Dr Nelly Laurence.   3 month ICM trend: 06/17/2023.    12-14 Month ICM trend:     Karie Soda, RN 06/17/2023 2:59 PM

## 2023-06-19 ENCOUNTER — Other Ambulatory Visit: Payer: Self-pay | Admitting: Family Medicine

## 2023-06-19 DIAGNOSIS — Z1231 Encounter for screening mammogram for malignant neoplasm of breast: Secondary | ICD-10-CM

## 2023-06-21 ENCOUNTER — Other Ambulatory Visit: Payer: Self-pay | Admitting: *Deleted

## 2023-06-21 DIAGNOSIS — C50411 Malignant neoplasm of upper-outer quadrant of right female breast: Secondary | ICD-10-CM

## 2023-06-21 MED ORDER — VENLAFAXINE HCL ER 75 MG PO CP24: 75.0000 mg | ORAL_CAPSULE | Freq: Every day | ORAL | 3 refills | Status: AC

## 2023-06-25 ENCOUNTER — Inpatient Hospital Stay: Payer: Federal, State, Local not specified - PPO | Admitting: Hematology and Oncology

## 2023-07-02 ENCOUNTER — Ambulatory Visit (INDEPENDENT_AMBULATORY_CARE_PROVIDER_SITE_OTHER): Payer: Federal, State, Local not specified - PPO

## 2023-07-02 DIAGNOSIS — I428 Other cardiomyopathies: Secondary | ICD-10-CM | POA: Diagnosis not present

## 2023-07-04 LAB — CUP PACEART REMOTE DEVICE CHECK
Battery Remaining Longevity: 22 mo
Battery Remaining Percentage: 29 %
Battery Voltage: 2.86 V
Brady Statistic AP VP Percent: 1 %
Brady Statistic AP VS Percent: 1 %
Brady Statistic AS VP Percent: 99 %
Brady Statistic AS VS Percent: 1 %
Brady Statistic RA Percent Paced: 1 %
Date Time Interrogation Session: 20250318020016
HighPow Impedance: 71 Ohm
HighPow Impedance: 71 Ohm
Implantable Lead Connection Status: 753985
Implantable Lead Connection Status: 753985
Implantable Lead Connection Status: 753985
Implantable Lead Implant Date: 20190920
Implantable Lead Implant Date: 20190920
Implantable Lead Implant Date: 20190920
Implantable Lead Location: 753858
Implantable Lead Location: 753859
Implantable Lead Location: 753860
Implantable Pulse Generator Implant Date: 20190920
Lead Channel Impedance Value: 1050 Ohm
Lead Channel Impedance Value: 390 Ohm
Lead Channel Impedance Value: 590 Ohm
Lead Channel Pacing Threshold Amplitude: 0.5 V
Lead Channel Pacing Threshold Amplitude: 0.75 V
Lead Channel Pacing Threshold Amplitude: 0.75 V
Lead Channel Pacing Threshold Pulse Width: 0.5 ms
Lead Channel Pacing Threshold Pulse Width: 0.5 ms
Lead Channel Pacing Threshold Pulse Width: 0.5 ms
Lead Channel Sensing Intrinsic Amplitude: 11.8 mV
Lead Channel Sensing Intrinsic Amplitude: 3.4 mV
Lead Channel Setting Pacing Amplitude: 2 V
Lead Channel Setting Pacing Amplitude: 2 V
Lead Channel Setting Pacing Amplitude: 2 V
Lead Channel Setting Pacing Pulse Width: 0.5 ms
Lead Channel Setting Pacing Pulse Width: 0.5 ms
Lead Channel Setting Sensing Sensitivity: 0.5 mV
Pulse Gen Serial Number: 9833194
Zone Setting Status: 755011

## 2023-07-10 ENCOUNTER — Encounter: Payer: Self-pay | Admitting: Cardiovascular Disease

## 2023-07-18 ENCOUNTER — Telehealth: Payer: Self-pay

## 2023-07-18 NOTE — Telephone Encounter (Signed)
 Received call from patient. She received a text 4/2 that her monitor was disconnected.  She call tech services today and reports she sent a transmission. Advised a new transmission today has not been received but explained if she has a poor tower signal at home it may not send.  Advised an automatic remote transmission is scheduled for 4/7 and will call her if it is not received.

## 2023-07-22 ENCOUNTER — Ambulatory Visit: Attending: Cardiovascular Disease

## 2023-07-22 DIAGNOSIS — Z95810 Presence of automatic (implantable) cardiac defibrillator: Secondary | ICD-10-CM

## 2023-07-22 DIAGNOSIS — I5022 Chronic systolic (congestive) heart failure: Secondary | ICD-10-CM

## 2023-07-24 ENCOUNTER — Telehealth: Payer: Self-pay

## 2023-07-24 NOTE — Progress Notes (Addendum)
 EPIC Encounter for ICM Monitoring  Patient Name: Loretta Thomas is a 73 y.o. female Date: 07/24/2023 Primary Care Physican: Delorse Lek, FNP Primary Cardiologist: Bensimhon Electrophysiologist: Mealor Bi-V Pacing:   >99%       06/25/2022 Office 187 lbs 12/13/2022 Weight: 176 lbs  01/25/2023 Weight: 171 lbs 04/12/2023 Weight: 170 lbs 05/12/2022 Weight: 170 lbs 06/17/2023 Weight: 171 lbs 07/24/2023 Weight: 169 lbs    AT/AF Burden: 0%                                              Spoke with patient and heart failure questions reviewed.  Transmission results reviewed.  Pt asymptomatic for fluid accumulation.       Diet:  Is not strict with dietary salt intake.       Corvue thoracic impedance suggesting normal fluid levels with the exception of possible fluid accumulation from 3/15-3/23 and possible dryness 3/26-3/29 and 4/2-4/4.   Prescribed:  Furosemide 40 mg take 1 tablet every other day.  She self adjusts Furosemide as needed.   Spironolactone 25 mg take 0.5 tablet (12.5 mg total) by mouth daily Farxiga 10mg  take 1 tablet by mouth daily    Labs: 11/26/2022 Creatinine 0.88, BUN 14, Potassium 4.0, Sodium 137, GFR >60 05/07/2022 Creatinine 0.79, BUN 10, Potassium 3.9, Sodium 141, GFR >60 A complete set of results can be found in Results Review.   Recommendations:  No changes and encouraged to call if experiencing any fluid symptoms.   Follow-up plan: ICM clinic phone appointment on 08/26/2023.   91 day device clinic remote transmission 10/01/2023.     EP/Cardiology Office Visits:   Recall 10/07/2023 with Dr Nelly Laurence.   08/21/2023 with Dr Gala Romney.    Copy of ICM check sent to Dr Nelly Laurence.   3 month ICM trend: 07/22/2023.    12-14 Month ICM trend:     Karie Soda, RN 07/24/2023 1:30 PM

## 2023-07-24 NOTE — Telephone Encounter (Signed)
 Remote ICM transmission received.  Attempted call to patient regarding ICM remote transmission and left detailed message per DPR.  Left ICM phone number and advised to return call for any fluid symptoms or questions. Next ICM remote transmission scheduled 08/26/2023.

## 2023-07-26 ENCOUNTER — Other Ambulatory Visit: Payer: Self-pay | Admitting: Family Medicine

## 2023-07-26 ENCOUNTER — Ambulatory Visit
Admission: RE | Admit: 2023-07-26 | Discharge: 2023-07-26 | Disposition: A | Discharge status: Home / Self Care | Source: Ambulatory Visit | Attending: Family Medicine | Admitting: Family Medicine

## 2023-07-26 ENCOUNTER — Ambulatory Visit

## 2023-07-26 DIAGNOSIS — Z1231 Encounter for screening mammogram for malignant neoplasm of breast: Secondary | ICD-10-CM

## 2023-08-19 NOTE — Progress Notes (Signed)
 Remote ICD transmission.

## 2023-08-19 NOTE — Addendum Note (Signed)
 Addended by: Lott Rouleau A on: 08/19/2023 11:10 AM   Modules accepted: Orders

## 2023-08-21 ENCOUNTER — Other Ambulatory Visit: Payer: Self-pay

## 2023-08-21 ENCOUNTER — Ambulatory Visit (HOSPITAL_BASED_OUTPATIENT_CLINIC_OR_DEPARTMENT_OTHER)
Admission: RE | Admit: 2023-08-21 | Discharge: 2023-08-21 | Disposition: A | Discharge status: Home / Self Care | Source: Ambulatory Visit | Attending: Internal Medicine | Admitting: Internal Medicine

## 2023-08-21 ENCOUNTER — Ambulatory Visit (HOSPITAL_COMMUNITY)
Admission: RE | Admit: 2023-08-21 | Discharge: 2023-08-21 | Disposition: A | Discharge status: Home / Self Care | Source: Ambulatory Visit | Attending: Internal Medicine | Admitting: Internal Medicine

## 2023-08-21 VITALS — BP 126/78 | HR 81 | Wt 169.6 lb

## 2023-08-21 DIAGNOSIS — I5022 Chronic systolic (congestive) heart failure: Secondary | ICD-10-CM | POA: Diagnosis present

## 2023-08-21 DIAGNOSIS — Z91014 Allergy to mammalian meats: Secondary | ICD-10-CM | POA: Diagnosis not present

## 2023-08-21 DIAGNOSIS — I2510 Atherosclerotic heart disease of native coronary artery without angina pectoris: Secondary | ICD-10-CM | POA: Insufficient documentation

## 2023-08-21 DIAGNOSIS — R9431 Abnormal electrocardiogram [ECG] [EKG]: Secondary | ICD-10-CM | POA: Diagnosis not present

## 2023-08-21 DIAGNOSIS — I447 Left bundle-branch block, unspecified: Secondary | ICD-10-CM | POA: Diagnosis not present

## 2023-08-21 DIAGNOSIS — Z9011 Acquired absence of right breast and nipple: Secondary | ICD-10-CM | POA: Insufficient documentation

## 2023-08-21 DIAGNOSIS — E663 Overweight: Secondary | ICD-10-CM | POA: Diagnosis not present

## 2023-08-21 DIAGNOSIS — Z853 Personal history of malignant neoplasm of breast: Secondary | ICD-10-CM

## 2023-08-21 DIAGNOSIS — Z95810 Presence of automatic (implantable) cardiac defibrillator: Secondary | ICD-10-CM

## 2023-08-21 DIAGNOSIS — G4733 Obstructive sleep apnea (adult) (pediatric): Secondary | ICD-10-CM | POA: Diagnosis not present

## 2023-08-21 DIAGNOSIS — Z79899 Other long term (current) drug therapy: Secondary | ICD-10-CM | POA: Diagnosis not present

## 2023-08-21 DIAGNOSIS — Z6826 Body mass index (BMI) 26.0-26.9, adult: Secondary | ICD-10-CM | POA: Diagnosis not present

## 2023-08-21 DIAGNOSIS — Z4502 Encounter for adjustment and management of automatic implantable cardiac defibrillator: Secondary | ICD-10-CM | POA: Diagnosis not present

## 2023-08-21 DIAGNOSIS — E785 Hyperlipidemia, unspecified: Secondary | ICD-10-CM | POA: Insufficient documentation

## 2023-08-21 DIAGNOSIS — Z006 Encounter for examination for normal comparison and control in clinical research program: Secondary | ICD-10-CM

## 2023-08-21 DIAGNOSIS — F419 Anxiety disorder, unspecified: Secondary | ICD-10-CM | POA: Diagnosis not present

## 2023-08-21 DIAGNOSIS — Z87440 Personal history of urinary (tract) infections: Secondary | ICD-10-CM | POA: Diagnosis not present

## 2023-08-21 DIAGNOSIS — I110 Hypertensive heart disease with heart failure: Secondary | ICD-10-CM | POA: Insufficient documentation

## 2023-08-21 LAB — ECHOCARDIOGRAM COMPLETE
Area-P 1/2: 5.84 cm2
Est EF: 50
S' Lateral: 3.2 cm
Single Plane A4C EF: 45.4 %

## 2023-08-21 NOTE — Research (Signed)
 SITE: [id number]     Subject # [id number]   Subprotocol: A  Inclusion Criteria  Patients who meet all of the following criteria are eligible for enrollment as study participants:  Yes No  Age > 72 years old X   Eligible to wear Holter Study X    Exclusion Criteria  Patients who meet any of these criteria are not eligible for enrollment as study participants: Yes No  1. Receiving any mechanical (respiratory or circulatory) or renal support therapy at Screening or during Visit #1.  X  2.  Any other conditions that in the opinion of the investigators are likely to prevent compliance with the study protocol or pose a safety concern if the subject participates in the study.  X  3. Poor tolerance, namely susceptible to severe skin allergies from ECG adhesive patch application.  X   Protocol: REV H    60 minute start window         Cor device must be applied, and the study initiated, no later than 60 minutes of completing the Echocardiogram                             HH:MM  Echo completion time  12:07  2.   Cor Study start time  12:17   30-Minute execution window  Once Cor Monitoring begins, 3 QT Med ECGs and the 15-minute rest period must be completed within a 30 minute window     HH:MM  3. QT Med ECG Completion time  12:20  4. Start of 15-Min sitting rest period  12:20  5. End of 15-Min rest period  12:35  6. Time of device removal  12:47   *Continue to use the Mobile App Event feature to log the Rest period windows and follow instructions on the EF-ACT Clinical Trial  Patient Instruction Card.  Describe any anomalies in Protocol execution in the Protocol Deviation Log    Residential Zip code [address] (First 3 digits ONLY)                                           [facility] Informed Consent   Subject Name: Loretta Thomas  Subject met inclusion and exclusion criteria.  The informed consent form, study requirements and expectations were reviewed with the subject. Subject had opportunity to  read consent and questions and concerns were addressed prior to the signing of the consent form.  The subject verbalized understanding of the trial requirements.  The subject agreed to participate in the [facility] EF ACT trial and signed the informed consent at 12:10 on 21-Aug-2023.  The informed consent was obtained prior to performance of any protocol-specific procedures for the subject.  A copy of the signed informed consent was given to the subject and a copy was placed in the subject's medical record.   Loretta Thomas          Current Outpatient Medications:    acetaminophen  (TYLENOL ) 500 MG tablet, Take 500 mg by mouth every 6 (six) hours as needed. , Disp: , Rfl:    ALPRAZolam  (XANAX ) 0.25 MG tablet, Take 1 tablet (0.25 mg total) by mouth 2 (two) times daily as needed for anxiety., Disp: 30 tablet, Rfl: 0   amoxicillin (AMOXIL) 500 MG capsule, Take 500 mg by mouth as needed. Prior to dental appt, Disp: ,  Rfl:    carvedilol  (COREG ) 3.125 MG tablet, Take 1 tablet (3.125 mg total) by mouth 2 (two) times daily with a meal., Disp: 180 tablet, Rfl: 3   fluconazole  (DIFLUCAN ) 150 MG tablet, Take 150 mg by mouth as needed., Disp: , Rfl:    fluticasone (FLONASE SENSIMIST) 27.5 MCG/SPRAY nasal spray, 27.5 sprays as needed., Disp: , Rfl:    furosemide  (LASIX ) 40 MG tablet, Take 1 tablet (40 mg total) by mouth daily. (Patient taking differently: Patient takes 40 mg by mouth daily and 20 mg every other day), Disp: 30 tablet, Rfl: 5   loratadine (CLARITIN) 10 MG tablet, Take 10 mg by mouth as needed for allergies., Disp: , Rfl:    losartan  (COZAAR ) 100 MG tablet, Take 1 tablet (100 mg total) by mouth daily., Disp: 90 tablet, Rfl: 3   rosuvastatin  (CRESTOR ) 5 MG tablet, Take 1 tablet (5 mg total) by mouth daily., Disp: 90 tablet, Rfl: 3   spironolactone  (ALDACTONE ) 25 MG tablet, Take 1 tablet (25 mg total) by mouth daily. (Patient taking differently: Take 12.5 mg by mouth daily.), Disp: 90 tablet, Rfl: 3    tirzepatide (MOUNJARO) 7.5 MG/0.5ML Pen, Inject 7.5 mg into the skin once a week., Disp: , Rfl:    venlafaxine  XR (EFFEXOR -XR) 75 MG 24 hr capsule, Take 1 capsule (75 mg total) by mouth daily with breakfast., Disp: 90 capsule, Rfl: 3

## 2023-08-21 NOTE — Progress Notes (Signed)
  Echocardiogram 2D Echocardiogram has been performed.  Loretta Thomas 08/21/2023, 12:14 PM

## 2023-08-21 NOTE — Progress Notes (Signed)
 ADVANCED HF CLINIC NOTE  PCP: Daphney Eans, FNP Primary Cardiologist: Concepcion Deck HF Cardiologist: Dr. Julane Ny  HPI: Loretta Thomas is a 73 y.o. female with severe HTN, anxiety, LBBB and systolic HF with EF 20-25%. She has had multiple heart catheterizations 2002 [facility]), 2011 [facility]), 2013 [facility]). All showed normal or minimal CAD.  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 No chemoRx or XRT. 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  Developed LV dysfunction in 2019 (I cannot find echo from before them either in our system or at [facility]).    Echo 7/19 revealed EF 15%, mild LVH, global HK, moderate MR. Underwent BivICD in 9/19.   Echo 1/21 EF 20-25% with normal RV and underwent AV optimization  Had PYP scan 07/13/19 read as strongly suggestive of TTR amyloidosis  I saw  -> her and felt like scan was negative for TTR.   She developed a rash with Entresto   Echo 4/21 EF 25-30%  Sleep study 4/21 AHI 7.1  Echo 12/21 EF 25-30%.  Echo 7/22 LVEF 30-35%, Grade 1 DD Echo 05/07/22 EF 50-55% G1 DD. EF improved with CRT and GDMT.  Today she returns for HF follow up. Has been followed by Jolan Natal RN in [facility]. Doing well. Still under a lot of stress. No edema, orthopnea or PND. Says she is doing really good with her walking. Goes to [employer] and does ADLs without problem. Lost 30 pounds on Mounjaro. She reports recent labs with PCP were ok.   Echo today 08/21/23 EF 50% Personally reviewed   Past Medical History:  Diagnosis Date   Anxiety    Bladder cystocele    Breast cancer (HCC)    s/p L breast cancer removal   Bundle branch block, left    diagnosed 03/2012 followed by cath showing only minimal CAD   Bursitis    Cancer (HCC)    right breast   Capsulitis    CHF (congestive heart failure) (HCC)    Diverticulosis    Elevated liver enzymes 2007   History of IBS     Hyperlipidemia    Hypertension    Nonischemic cardiomyopathy (HCC)    pernicious anemia     Current Outpatient Medications  Medication Sig Dispense Refill   acetaminophen  (TYLENOL ) 500 MG tablet Take 500 mg by mouth every 6 (six) hours as needed.      ALPRAZolam  (XANAX ) 0.25 MG tablet Take 1 tablet (0.25 mg total) by mouth 2 (two) times daily as needed for anxiety. 30 tablet 0   amoxicillin (AMOXIL) 500 MG capsule Take 500 mg by mouth as needed. Prior to dental appt     carvedilol  (COREG ) 3.125 MG tablet Take 1 tablet (3.125 mg total) by mouth 2 (two) times daily with a meal. 180 tablet 3   fluconazole  (DIFLUCAN ) 150 MG tablet Take 150 mg by mouth as needed.     fluticasone (FLONASE SENSIMIST) 27.5 MCG/SPRAY nasal spray 27.5 sprays as needed.     furosemide  (LASIX ) 40 MG tablet Take 1 tablet (40 mg total) by mouth daily. (Patient taking differently: Patient takes 40 mg by mouth daily and 20 mg every other day) 30 tablet 5   loratadine (CLARITIN) 10 MG tablet Take 10 mg by mouth as needed for allergies.     losartan  (COZAAR ) 100 MG tablet Take 1 tablet (100 mg total) by mouth daily. 90 tablet 3   rosuvastatin  (CRESTOR )  5 MG tablet Take 1 tablet (5 mg total) by mouth daily. 90 tablet 3   spironolactone  (ALDACTONE ) 25 MG tablet Take 1 tablet (25 mg total) by mouth daily. (Patient taking differently: Take 12.5 mg by mouth daily.) 90 tablet 3   tirzepatide (MOUNJARO) 7.5 MG/0.5ML Pen Inject 7.5 mg into the skin once a week.     venlafaxine  XR (EFFEXOR -XR) 75 MG 24 hr capsule Take 1 capsule (75 mg total) by mouth daily with breakfast. 90 capsule 3   No current facility-administered medications for this encounter.   Allergies  Allergen Reactions   Codeine Nausea Only and Nausea And Vomiting    Stomach Pains   Hydrocodone-Acetaminophen  Nausea And Vomiting   Crab Extract Other (See Comments)   Cucumber Extract Other (See Comments)   Demerol [Meperidine]     Upset stomach   Hydrocodone     Lortab [Hydrocodone-Acetaminophen ]     Upset stomach   Mangifera Indica    Other Other (See Comments)   Sacubitril -Valsartan  Hives and Itching   Benzocaine Hives and Rash    Social History   Socioeconomic History   Marital status: Married    Spouse name: Not on file   Number of children: Not on file   Years of education: Not on file   Highest education level: Not on file  Occupational History   Not on file  Tobacco Use   Smoking status: Never   Smokeless tobacco: Never  Vaping Use   Vaping status: Never Used  Substance and Sexual Activity   Alcohol use: No   Drug use: No   Sexual activity: Yes  Other Topics Concern   Not on file  Social History Narrative   Lives in [place]   Social Drivers of Health   Financial Resource Strain: Not on file  Food Insecurity: Not on file  Transportation Needs: Not on file  Physical Activity: Not on file  Stress: Not on file  Social Connections: Not on file  Intimate Partner Violence: Not on file   Family History  Problem Relation Age of Onset   Cancer Sister        skin and colon x2   Cancer Brother        esophageal   Cancer Father 59       mouth   Cancer Maternal Aunt 55       breast   Breast cancer Maternal Aunt 55   Cancer Maternal Grandmother 71       ovarian   Cancer Cousin 53       mat 1st cousin with breast   Breast cancer Cousin 62   Cancer Paternal Grandfather 63       lung   BP 126/78   Pulse 81   Wt 76.9 kg (169 lb 9.6 oz)   SpO2 97%   BMI 26.56 kg/m   Wt Readings from Last 3 Encounters:  08/21/23 76.9 kg (169 lb 9.6 oz)  04/23/23 76.1 kg (167 lb 12.8 oz)  12/11/22 80.1 kg (176 lb 9.6 oz)   PHYSICAL EXAM: General:  Well appearing. No resp difficulty HEENT: normal Neck: supple. no JVD. Carotids 2+ bilat; no bruits. No lymphadenopathy or thryomegaly appreciated. Cor: PMI nondisplaced. Regular rate & rhythm. No rubs, gallops or murmurs. Lungs: clear Abdomen: soft, nontender, nondistended. No  hepatosplenomegaly. No bruits or masses. Good bowel sounds. Extremities: no cyanosis, clubbing, rash, edema Neuro: alert & orientedx3, cranial nerves grossly intact. moves all 4 extremities w/o difficulty. Affect pleasant  Device interrogation (personally reviewed): Fluid ok. No VT/AF. BiV pacing > 99% Personally reviewed   ASSESSMENT & PLAN:  1. Chronic systolic HF  - due to presumed NICM - multiple cardiac caths with no CAD (last 2013) - EF 15% in 7/19 felt due to LBBB - s/p St Jude CRT-D in 9/19 - Echo 1/21 EF 25%. 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 SPEP negative (Dr. Julane Ny thought it was negative) - Echo (12/21): EF 25-30%.  - Echo (7/22): EF 30-35%, Grade 1 DD - Echo (05/07/22): EF 50-55% G1 DD -> EF recovered with CRT and GDMT - Echo today 08/21/23 EF 50% Personally reviewed - Much improved from functional standpoint - NYHA I-II - Volume status ok on lasix  40 daily alternating with 20 qD - Continue losartan  100 mg daily (rash with Entresto ) - Continue carvedilol  3.125 mg bid (Intolerant of titration due to fatigue).  - Continue spironolactone  12.5 mg daily. - Off Farxiga  due to recurrent UTIs. Would not restart at this point - ICD interrogated as above - PYP scan was equivocal and based on echo findings (completely normal RV) I do not think she has cardiac amyloid or at least not severe enough to cause this degree of LV dysfunction. Myeloma panel negative. Genetic testing negative  - Previously discussed Barostim. Insurance wouldn't cover - Overall much improved with normalization of EF with CRT.  - Recent labs with PCP were ok per her report  2. LBBB - s/p CRT-D - > 99% biv pacing on device interrogation  3. OSA - Repeat sleep study 12/23 + for OSA - Much improved/resolved with weight loss - Continue Mounjaro  4. Breast Cancer - Treated with surgery and letrazole (no chemo or XRT) - no change  5. Hyperlipdemia  -  Follows with PCP   6. Overweight - Body mass index is 26.56 kg/m. - Lost 30 pounds on Mounjaro  Doing very well. EF has normalized with CRT. Can graduate HF Clinic anf follow with General Cardiology in [facility],   Jules Oar, MD  12:41 PM

## 2023-08-21 NOTE — Patient Instructions (Signed)
 CONGRATULATIONS!!! You have graduated the [facility] please follow-up with [facility] in [place] in 6 months (November) they will call you to schedule  If you have any questions or concerns before your next appointment please send us  a message through [place] or call our office at [phone].    TO LEAVE A MESSAGE FOR THE NURSE SELECT OPTION 2, PLEASE LEAVE A MESSAGE INCLUDING: YOUR NAME DATE OF BIRTH CALL BACK NUMBER REASON FOR CALL**this is important as we prioritize the call backs  YOU WILL RECEIVE A CALL BACK THE SAME DAY AS LONG AS YOU CALL BEFORE 4:00 PM

## 2023-08-26 ENCOUNTER — Ambulatory Visit: Attending: Cardiovascular Disease

## 2023-08-26 DIAGNOSIS — I5022 Chronic systolic (congestive) heart failure: Secondary | ICD-10-CM | POA: Diagnosis not present

## 2023-08-26 DIAGNOSIS — Z95810 Presence of automatic (implantable) cardiac defibrillator: Secondary | ICD-10-CM

## 2023-08-27 ENCOUNTER — Inpatient Hospital Stay: Attending: Hematology and Oncology | Admitting: Hematology and Oncology

## 2023-08-27 VITALS — BP 107/65 | HR 97 | Temp 98.3°F | Resp 18 | Ht 67.0 in | Wt 166.7 lb

## 2023-08-27 DIAGNOSIS — H9190 Unspecified hearing loss, unspecified ear: Secondary | ICD-10-CM | POA: Insufficient documentation

## 2023-08-27 DIAGNOSIS — Z9011 Acquired absence of right breast and nipple: Secondary | ICD-10-CM | POA: Insufficient documentation

## 2023-08-27 DIAGNOSIS — C50411 Malignant neoplasm of upper-outer quadrant of right female breast: Secondary | ICD-10-CM

## 2023-08-27 DIAGNOSIS — Z950 Presence of cardiac pacemaker: Secondary | ICD-10-CM | POA: Insufficient documentation

## 2023-08-27 DIAGNOSIS — F329 Major depressive disorder, single episode, unspecified: Secondary | ICD-10-CM | POA: Insufficient documentation

## 2023-08-27 DIAGNOSIS — F419 Anxiety disorder, unspecified: Secondary | ICD-10-CM | POA: Insufficient documentation

## 2023-08-27 DIAGNOSIS — Z08 Encounter for follow-up examination after completed treatment for malignant neoplasm: Secondary | ICD-10-CM | POA: Insufficient documentation

## 2023-08-27 DIAGNOSIS — I509 Heart failure, unspecified: Secondary | ICD-10-CM | POA: Diagnosis not present

## 2023-08-27 DIAGNOSIS — Z170 Estrogen receptor positive status [ER+]: Secondary | ICD-10-CM | POA: Diagnosis not present

## 2023-08-27 DIAGNOSIS — Z79899 Other long term (current) drug therapy: Secondary | ICD-10-CM | POA: Diagnosis not present

## 2023-08-27 DIAGNOSIS — Z853 Personal history of malignant neoplasm of breast: Secondary | ICD-10-CM | POA: Diagnosis present

## 2023-08-27 NOTE — Progress Notes (Signed)
 Patient Care Team: Daphney Eans, FNP as PCP - General (Family Medicine) Jacqueline Matsu, MD as PCP - Sleep Medicine (Cardiology) Bensimhon, Rheta Celestine, MD as PCP - Advanced Heart Failure (Cardiology) Mealor, Donnamae Gaba, MD as PCP - Electrophysiology (Cardiology) Fatade, Ayokunle, DO as Referring Physician (Family Medicine)  DIAGNOSIS:  Encounter Diagnosis  Name Primary?   Malignant neoplasm of upper-outer quadrant of right breast in female, estrogen receptor positive (HCC) Yes    SUMMARY OF ONCOLOGIC HISTORY: Oncology History  Breast cancer of upper-outer quadrant of right female breast (HCC)  10/19/2013 Mammogram   outer right breast, middle third, demonstrate a 6 x 6 x 12 mm group of heterogeneous calcifications   11/10/2013 Breast MRI   nonmacerated enhancement in the retroareolar region extending to the nipple with the area of enhancement measuring 4.3 x 2.5 x 3.0 cm   11/12/2013 Initial Diagnosis   Right mastectomy: Invasive ductal carcinoma with DCIS with comedo-type necrosis and calcifications in ER 99% PR 30% Ki-67 40% HER-2 negative ratio 1.14; Grade 3; second biopsy from subareolar area done 11/16/2013 revealed DCIS with calcifications   12/29/2013 Procedure   BRCA 1 and 2 Negative: 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 (32-58% lifetime risk of breast cancer and increased lifetime risk of pancreatic cancer):RAD51D variant of unknown significance: c.[id number]>G   03/09/2014 Procedure   Oncotype DX recurrence score 24, 16% risk of recurrence intermediate risk, patient offered chemotherapy   03/22/2014 -  Anti-estrogen oral therapy   Arimidex  1 mg daily switched to letrozole  07/26/2016 due to muscle aches and pains     CHIEF COMPLIANT: Follow-up on letrozole  therapy  HISTORY OF PRESENT ILLNESS: History of Present Illness Loretta Thomas is a 73 year old female who presents for follow-up and medication management.  She has a history of breast cancer treated with  Oletrozole, completed two years ago after seven to eight years of therapy. She cannot undergo MRI due to a pacemaker but continues with regular mammograms.  She takes Effexor  and requests a year's supply. She has been on Mounjaro, resulting in a weight loss of 30 to 40 pounds. Her vitamin D  levels were elevated at 125, possibly due to multiple supplements, which she has since stopped. She is awaiting a recheck of her levels.     ALLERGIES:  is allergic to codeine, hydrocodone-acetaminophen , crab extract, cucumber extract, demerol [meperidine], hydrocodone, lortab [hydrocodone-acetaminophen ], mangifera indica, other, sacubitril -valsartan , and benzocaine.  MEDICATIONS:  Current Outpatient Medications  Medication Sig Dispense Refill   acetaminophen  (TYLENOL ) 500 MG tablet Take 500 mg by mouth every 6 (six) hours as needed.      carvedilol  (COREG ) 3.125 MG tablet Take 1 tablet (3.125 mg total) by mouth 2 (two) times daily with a meal. 180 tablet 3   fluticasone (FLONASE SENSIMIST) 27.5 MCG/SPRAY nasal spray 27.5 sprays as needed.     furosemide  (LASIX ) 40 MG tablet Take 1 tablet (40 mg total) by mouth daily. (Patient taking differently: Patient takes 40 mg by mouth daily and 20 mg every other day) 30 tablet 5   losartan  (COZAAR ) 100 MG tablet Take 1 tablet (100 mg total) by mouth daily. 90 tablet 3   rosuvastatin  (CRESTOR ) 5 MG tablet Take 1 tablet (5 mg total) by mouth daily. 90 tablet 3   spironolactone  (ALDACTONE ) 25 MG tablet Take 1 tablet (25 mg total) by mouth daily. (Patient taking differently: Take 12.5 mg by mouth daily.) 90 tablet 3   tirzepatide (MOUNJARO) 7.5 MG/0.5ML Pen Inject 7.5 mg into  the skin once a week.     venlafaxine  XR (EFFEXOR -XR) 75 MG 24 hr capsule Take 1 capsule (75 mg total) by mouth daily with breakfast. 90 capsule 3   ALPRAZolam  (XANAX ) 0.25 MG tablet Take 1 tablet (0.25 mg total) by mouth 2 (two) times daily as needed for anxiety. (Patient not taking: Reported on  08/27/2023) 30 tablet 0   amoxicillin (AMOXIL) 500 MG capsule Take 500 mg by mouth as needed. Prior to dental appt (Patient not taking: Reported on 08/27/2023)     fluconazole  (DIFLUCAN ) 150 MG tablet Take 150 mg by mouth as needed. (Patient not taking: Reported on 08/27/2023)     loratadine (CLARITIN) 10 MG tablet Take 10 mg by mouth as needed for allergies. (Patient not taking: Reported on 08/27/2023)     No current facility-administered medications for this visit.    PHYSICAL EXAMINATION: ECOG PERFORMANCE STATUS: 1 - Symptomatic but completely ambulatory  Vitals:   08/27/23 1107  BP: 107/65  Pulse: 97  Resp: 18  Temp: 98.3 F (36.8 C)  SpO2: 98%   Filed Weights   08/27/23 1107  Weight: 166 lb 11.2 oz (75.6 kg)    LABORATORY DATA:  I have reviewed the data as listed    Latest Ref Rng & Units 04/23/2023   12:52 PM 11/26/2022    4:18 PM 05/07/2022    3:01 PM  CMP  Glucose 70 - 99 mg/dL 94  161  096   BUN 8 - 23 mg/dL 8  14  10    Creatinine 0.44 - 1.00 mg/dL 0.45  4.09  8.11   Sodium 135 - 145 mmol/L 140  137  141   Potassium 3.5 - 5.1 mmol/L 4.3  4.0  3.9   Chloride 98 - 111 mmol/L 105  100  106   CO2 22 - 32 mmol/L 27  25  24    Calcium  8.9 - 10.3 mg/dL 9.5  9.3  9.2     Lab Results  Component Value Date   WBC 8.8 11/26/2022   HGB 13.4 11/26/2022   HCT 41.8 11/26/2022   MCV 94.4 11/26/2022   PLT 191 11/26/2022   NEUTROABS 4.4 06/21/2021    ASSESSMENT & PLAN:  Breast cancer of upper-outer quadrant of right female breast (HCC) Right breast IDC with high-grade DCIS with comedonecrosis ER/PR positive HER-2 negative Ki-67 was 40%.  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  NCCN guidelines now recommend annual MRI screening OR a bilateral prophylactic mastectomy for PALB2 patient preferred Surveillance with annual breast MRIs   --------------------------------------------------------------------------------------------------------------------------------------------------------------  Current treatment: Anastrozole  1 mg daily started 03/22/2014 5 years; switched to letrozole  07/26/2016 Letrozole  toxicities: Tolerating it well   Major depression: On Effexor  Breast Cancer Surveillance: 1. Mammogram 07/31/2023 left breast: Benign breast density category B 2.  Breast exam: 08/27/2023: Benign 3.  Breast MRI 10/22/2017: Benign, patient does not want to do any further breast MRIs.   Congestive heart failure: Currently on therapy   Prior history of pernicious anemia on monthly B12 injections: B12 injections   Return to clinic on an as needed basis   ------------------------------------- Assessment and Plan Assessment & Plan Malignant neoplasm of breast Completed treatment for estrogen receptor-positive breast cancer. Due to pacemaker, unable to perform MRI; will continue with mammograms for surveillance. Transitioning care to primary care physician for ongoing management. - Continue  regular mammograms for breast cancer surveillance. - Coordinate with primary care physician for ongoing management.  Urinary tract infection Recurrent UTIs every 2-3 months. History of bladder issues requiring surgical intervention, delayed due to cardiac concerns. Plan to address bladder issues surgically once cardiac status is optimized. - Plan for bladder surgery to address anatomical issues once cardiac status is optimized.  Sleep apnea Symptoms of fatigue and lack of energy, possibly related to untreated sleep apnea. Awaiting follow-up for CPAP machine fitting. Discussed impact of sleep apnea on energy levels and overall health. - Follow up on CPAP machine fitting for sleep apnea management.  Anxiety Ongoing anxiety managed with venlafaxine . Discussed impact of stress and anxiety on overall well-being. Transitioning management to  primary care physician. - Continue venlafaxine  for anxiety management. - Coordinate with primary care physician for ongoing management of anxiety.  Vitamin D  excess Elevated vitamin D  levels likely due to over-supplementation and improved absorption with Mounjaro. Advised to discontinue vitamin D  supplements temporarily and recheck levels. - Recheck vitamin D  levels in 3 months after discontinuing supplements.  Hearing loss Persistent fluid in ears suspected, causing hearing difficulties. Plans to see an ENT specialist for further evaluation. - Refer to ENT specialist for evaluation of hearing loss and possible fluid in ears.      No orders of the defined types were placed in this encounter.  The patient has a good understanding of the overall plan. she agrees with it. she will call with any problems that may develop before the next visit here. Total time spent: 30 mins including face to face time and time spent for planning, charting and co-ordination of care   Viinay K Makaio Mach, MD 08/27/23

## 2023-08-27 NOTE — Assessment & Plan Note (Addendum)
 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  NCCN guidelines now recommend annual MRI screening OR a bilateral prophylactic mastectomy for PALB2 patient preferred Surveillance with annual breast MRIs  --------------------------------------------------------------------------------------------------------------------------------------------------------------  Current treatment: Anastrozole  1 mg daily started 03/22/2014 5 years; switched to letrozole  07/26/2016 Completed May 2023   Major depression: On Effexor  Breast Cancer Surveillance: 1. Mammogram 07/31/2023 left breast: Benign breast density category B 2.  Breast exam: 08/27/2023: Benign 3.  Breast MRI 10/22/2017: Benign, cannot do breast MRIs because of pacemaker   Congestive heart failure: Currently on therapy   Prior history of pernicious anemia on monthly B12 injections: B12 injections   Return to clinic on an as needed basis

## 2023-08-28 NOTE — Progress Notes (Signed)
 EPIC Encounter for ICM Monitoring  Patient Name: Loretta Thomas is a 73 y.o. female Date: 08/28/2023 Primary Care Physican: Daphney Eans, FNP Primary Cardiologist: Bensimhon (no longer in HF Clinic 08/21/2023) Electrophysiologist: Mealor Bi-V Pacing:   >99%       06/25/2022 Office 187 lbs 12/13/2022 Weight: 176 lbs  01/25/2023 Weight: 171 lbs 04/12/2023 Weight: 170 lbs 05/12/2022 Weight: 170 lbs 06/17/2023 Weight: 171 lbs 07/24/2023 Weight: 169 lbs 08/28/2023 Weight: 166 lbs (she has lost 30 lbs)    AT/AF Burden: 0%                                              Spoke with patient and heart failure questions reviewed.  Transmission results reviewed.  Pt asymptomatic for fluid accumulation.       Diet:  Tries to restrict salt intake.       Corvue thoracic impedance suggesting normal fluid levels with the exception of possible fluid accumulation from 4/27-5/1 & 5/7-5/9.   Prescribed:  Furosemide  40 mg take 1 tablet by mouth every day.  She self adjusts Furosemide  as needed.   Spironolactone  25 mg take 0.5 tablet (12.5 mg total) by mouth daily Farxiga  10mg  take 1 tablet by mouth daily    Labs: 04/23/2023 Creatinine 0.76, BUN 8, Potassium 4.3, Sodium 140, GFR >60 A complete set of results can be found in Results Review.   Recommendations:  No changes and encouraged to call if experiencing any fluid symptoms.   Follow-up plan: ICM clinic phone appointment on 10/02/2023.   91 day device clinic remote transmission 10/01/2023.     EP/Cardiology Office Visits:   She will call for appt with Dr Arlester Ladd.  Recall 10/07/2023 with Dr Arlester Ladd.      Copy of ICM check sent to Dr Arlester Ladd.   3 month ICM trend: 08/26/2023.    12-14 Month ICM trend:     Almyra Jain, RN 08/28/2023 12:32 PM

## 2023-09-02 ENCOUNTER — Encounter: Payer: Self-pay | Admitting: Cardiovascular Disease

## 2023-09-02 ENCOUNTER — Ambulatory Visit: Attending: Cardiovascular Disease | Admitting: Cardiovascular Disease

## 2023-09-02 VITALS — BP 126/80 | HR 81 | Ht 67.0 in | Wt 166.0 lb

## 2023-09-02 DIAGNOSIS — Z95810 Presence of automatic (implantable) cardiac defibrillator: Secondary | ICD-10-CM | POA: Diagnosis not present

## 2023-09-02 DIAGNOSIS — I447 Left bundle-branch block, unspecified: Secondary | ICD-10-CM | POA: Diagnosis not present

## 2023-09-02 DIAGNOSIS — I5022 Chronic systolic (congestive) heart failure: Secondary | ICD-10-CM | POA: Diagnosis not present

## 2023-09-02 DIAGNOSIS — I428 Other cardiomyopathies: Secondary | ICD-10-CM | POA: Diagnosis not present

## 2023-09-02 LAB — CUP PACEART INCLINIC DEVICE CHECK
Battery Remaining Longevity: 21 mo
Brady Statistic RA Percent Paced: 0.01 %
Brady Statistic RV Percent Paced: 99.96 %
Date Time Interrogation Session: 20250519121850
HighPow Impedance: 77.625
HighPow Impedance: 78 Ohm
Implantable Lead Connection Status: 753985
Implantable Lead Connection Status: 753985
Implantable Lead Connection Status: 753985
Implantable Lead Implant Date: 20190920
Implantable Lead Implant Date: 20190920
Implantable Lead Implant Date: 20190920
Implantable Lead Location: 753858
Implantable Lead Location: 753859
Implantable Lead Location: 753860
Implantable Pulse Generator Implant Date: 20190920
Lead Channel Impedance Value: 1025 Ohm
Lead Channel Impedance Value: 375 Ohm
Lead Channel Impedance Value: 600 Ohm
Lead Channel Pacing Threshold Amplitude: 0.5 V
Lead Channel Pacing Threshold Amplitude: 0.5 V
Lead Channel Pacing Threshold Amplitude: 0.75 V
Lead Channel Pacing Threshold Amplitude: 0.75 V
Lead Channel Pacing Threshold Pulse Width: 0.5 ms
Lead Channel Pacing Threshold Pulse Width: 0.5 ms
Lead Channel Pacing Threshold Pulse Width: 0.5 ms
Lead Channel Pacing Threshold Pulse Width: 0.5 ms
Lead Channel Sensing Intrinsic Amplitude: 11.8 mV
Lead Channel Sensing Intrinsic Amplitude: 3.3 mV
Lead Channel Setting Pacing Amplitude: 2 V
Lead Channel Setting Pacing Amplitude: 2 V
Lead Channel Setting Pacing Amplitude: 2 V
Lead Channel Setting Pacing Pulse Width: 0.5 ms
Lead Channel Setting Pacing Pulse Width: 0.5 ms
Lead Channel Setting Sensing Sensitivity: 0.5 mV
Pulse Gen Serial Number: 9833194
Zone Setting Status: 755011

## 2023-09-02 NOTE — Progress Notes (Signed)
  Electrophysiology Office Note:    Date:  09/02/2023   ID:  ANNACLAIRE WALSWORTH, DOB 1950-10-29, MRN [id number]  PCP:  Daphney Eans, FNP   [facility] Providers Cardiologist:  None Electrophysiologist:  Efraim Grange, MD  Advanced Heart Failure:  Jules Oar, MD  Sleep Medicine:  Gaylyn Keas, MD     Referring MD: Daphney Eans, FNP   History of Present Illness:    Loretta Thomas is a 73 y.o. 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     Her history of CHF dates back to no later than 2019 when an echo showed an EF of 15%. BiV ICD was placed in September, 2019 due to NICM and LBBB. EF has improved progressively over the years. Most recently, 05/07/2022, EF had recovered to 50-55%.     Today, she reports that she is doing very well. She does occasionally feel a "twinge" in her left chest/axilla. she has no device related complaints -- no new tenderness, drainage, redness.   EKGs/Labs/Other Studies Reviewed Today:    Echocardiogram:  TTE 05/07/22 EF 50-55%   Monitors:   Stress testing:   Advanced imaging:   Cardiac catherization  Multiple -- reviewed in Epic.  EKG:         Physical Exam:    VS:  BP 126/80 (BP Location: Left Arm, Patient Position: Sitting, Cuff Size: Normal)   Pulse 81   Ht 5\' 7"  (1.702 m)   Wt 166 lb (75.3 kg)   SpO2 95%   BMI 26.00 kg/m     Wt Readings from Last 3 Encounters:  09/02/23 166 lb (75.3 kg)  08/27/23 166 lb 11.2 oz (75.6 kg)  08/21/23 169 lb 9.6 oz (76.9 kg)     GEN: Well nourished, well developed in no acute distress CARDIAC: RRR, no murmurs, rubs, gallops The device site is normal -- no tenderness, edema, drainage, redness, threatened erosion.  RESPIRATORY:  Normal work of breathing MUSCULOSKELETAL: no edema    ASSESSMENT & PLAN:    St Jude BiV ICD Normal function --LV capture threshold  is slightly elevated.  LV impedance is chronically within high normal range Possible intermittent stim effect from LV lead.  Will continue to monitor I reviewed today's interrogation in detail see Paceart.  CHF with recovered EF Continue carvedilol  3.125, Farxiga  10, losartan  100, spironolactone  25  Atrial high rate episodes Brief. More likely AT or sinus tachycardia -- none this interrogation Continue to monitor   Signed, Efraim Grange, MD  09/02/2023 11:46 AM    [facility]

## 2023-09-02 NOTE — Patient Instructions (Signed)

## 2023-09-07 ENCOUNTER — Ambulatory Visit: Payer: Self-pay | Admitting: Cardiovascular Disease

## 2023-09-12 ENCOUNTER — Telehealth: Payer: Self-pay | Admitting: *Deleted

## 2023-09-12 NOTE — Telephone Encounter (Signed)
 Received call from pt requesting information on last bone density test.  Looking at pt chart, pt last underwent bone density in 2015.  RN encouraged pt to f/u with PCP to order repeat.  Pt verbalized understanding.

## 2023-09-13 ENCOUNTER — Encounter: Payer: Self-pay | Admitting: Internal Medicine

## 2023-09-30 ENCOUNTER — Encounter

## 2023-10-01 ENCOUNTER — Ambulatory Visit (INDEPENDENT_AMBULATORY_CARE_PROVIDER_SITE_OTHER): Payer: Federal, State, Local not specified - PPO

## 2023-10-01 DIAGNOSIS — I428 Other cardiomyopathies: Secondary | ICD-10-CM

## 2023-10-01 LAB — CUP PACEART REMOTE DEVICE CHECK
Battery Remaining Longevity: 17 mo
Battery Remaining Percentage: 23 %
Battery Voltage: 2.8 V
Brady Statistic AP VP Percent: 1 %
Brady Statistic AP VS Percent: 0 %
Brady Statistic AS VP Percent: 99 %
Brady Statistic AS VS Percent: 1 %
Brady Statistic RA Percent Paced: 1 %
Date Time Interrogation Session: 20250617020026
HighPow Impedance: 77 Ohm
HighPow Impedance: 77 Ohm
Implantable Lead Connection Status: 753985
Implantable Lead Connection Status: 753985
Implantable Lead Connection Status: 753985
Implantable Lead Implant Date: 20190920
Implantable Lead Implant Date: 20190920
Implantable Lead Implant Date: 20190920
Implantable Lead Location: 753858
Implantable Lead Location: 753859
Implantable Lead Location: 753860
Implantable Pulse Generator Implant Date: 20190920
Lead Channel Impedance Value: 390 Ohm
Lead Channel Impedance Value: 580 Ohm
Lead Channel Impedance Value: 980 Ohm
Lead Channel Pacing Threshold Amplitude: 0.5 V
Lead Channel Pacing Threshold Amplitude: 0.75 V
Lead Channel Pacing Threshold Amplitude: 0.875 V
Lead Channel Pacing Threshold Pulse Width: 0.5 ms
Lead Channel Pacing Threshold Pulse Width: 0.5 ms
Lead Channel Pacing Threshold Pulse Width: 0.5 ms
Lead Channel Sensing Intrinsic Amplitude: 11.8 mV
Lead Channel Sensing Intrinsic Amplitude: 3.6 mV
Lead Channel Setting Pacing Amplitude: 2 V
Lead Channel Setting Pacing Amplitude: 2 V
Lead Channel Setting Pacing Amplitude: 2 V
Lead Channel Setting Pacing Pulse Width: 0.5 ms
Lead Channel Setting Pacing Pulse Width: 0.5 ms
Lead Channel Setting Sensing Sensitivity: 0.5 mV
Pulse Gen Serial Number: 9833194
Zone Setting Status: 755011

## 2023-10-02 ENCOUNTER — Ambulatory Visit: Attending: Cardiovascular Disease

## 2023-10-02 DIAGNOSIS — I5022 Chronic systolic (congestive) heart failure: Secondary | ICD-10-CM

## 2023-10-02 DIAGNOSIS — Z95810 Presence of automatic (implantable) cardiac defibrillator: Secondary | ICD-10-CM

## 2023-10-03 ENCOUNTER — Ambulatory Visit: Payer: Self-pay | Admitting: Cardiovascular Disease

## 2023-10-04 NOTE — Progress Notes (Signed)
 EPIC Encounter for ICM Monitoring  Patient Name: Loretta Thomas is a 73 y.o. female Date: 10/04/2023 Primary Care Physican: Daphney Eans, FNP Primary Cardiologist: Bensimhon (no longer in HF Clinic 08/21/2023) Electrophysiologist: Mealor Bi-V Pacing:   >99%       06/25/2022 Office 187 lbs 12/13/2022 Weight: 176 lbs  01/25/2023 Weight: 171 lbs 04/12/2023 Weight: 170 lbs 05/12/2022 Weight: 170 lbs 06/17/2023 Weight: 171 lbs 07/24/2023 Weight: 169 lbs 08/28/2023 Weight: 166 lbs (she has lost 30 lbs) 10/04/2023 Weight: 163 lbs    AT/AF Burden: 0%                                              Spoke with patient and heart failure questions reviewed.  Transmission results reviewed.  Pt asymptomatic for fluid accumulation.       Diet:  Tries to restrict salt intake.       Corvue thoracic impedance suggesting normal fluid levels with the exception of possible fluid accumulation from 6/4-6/13.   Prescribed:  Furosemide  40 mg take 1 tablet by mouth every day.  She self adjusts Furosemide  as needed.   Spironolactone  25 mg take 1 tablet (25 mg total) by mouth daily   Labs: 04/23/2023 Creatinine 0.76, BUN 8, Potassium 4.3, Sodium 140, GFR >60 A complete set of results can be found in Results Review.   Recommendations:  No changes and encouraged to call if experiencing any fluid symptoms.   Follow-up plan: ICM clinic phone appointment on 11/18/2023.   91 day device clinic remote transmission 12/31/2023.     EP/Cardiology Office Visits:   12/09/2023 with Dr Mallipeddi.  Recall 08/27/2024 with Dr Arlester Ladd.      Copy of ICM check sent to Dr Arlester Ladd.   3 month ICM trend: 10/02/2023.    12-14 Month ICM trend:     Almyra Jain, RN 10/04/2023 1:03 PM

## 2023-11-18 ENCOUNTER — Ambulatory Visit: Attending: Cardiovascular Disease

## 2023-11-18 DIAGNOSIS — I5022 Chronic systolic (congestive) heart failure: Secondary | ICD-10-CM

## 2023-11-18 DIAGNOSIS — Z95810 Presence of automatic (implantable) cardiac defibrillator: Secondary | ICD-10-CM

## 2023-11-20 NOTE — Progress Notes (Signed)
 EPIC Encounter for ICM Monitoring  Patient Name: Loretta Thomas is a 73 y.o. female Date: 11/20/2023 Primary Care Physican: Almeda Loa ORN, FNP Primary Cardiologist: Mallipeddi Electrophysiologist: Mealor Bi-V Pacing:   >99%       06/25/2022 Office 187 lbs 05/12/2022 Weight: 170 lbs 06/17/2023 Weight: 171 lbs 07/24/2023 Weight: 169 lbs 08/28/2023 Weight: 166 lbs (she has lost 30 lbs) 10/04/2023 Weight: 163 lbs 11/20/2023 Weight: 159-160 lbs (    AT/AF Burden: 0%                                              Spoke with patient and heart failure questions reviewed.  Transmission results reviewed.  Pt asymptomatic for fluid accumulation.  Reports feeling well at this time and voices no complaints.  She will be getting hearing aids in both ears.   Diet:  Tries to restrict salt intake.       Corvue thoracic impedance suggesting normal fluid levels with the exception of possible fluid accumulation from 7/8-7/14 and 7/23-7/28 (at the beach).   Prescribed:  Furosemide  40 mg take 1 tablet by mouth every day.  She self adjusts Furosemide  as needed.   Spironolactone  25 mg take 1 tablet (25 mg total) by mouth daily   Labs: 04/23/2023 Creatinine 0.76, BUN 8, Potassium 4.3, Sodium 140, GFR >60 A complete set of results can be found in Results Review.   Recommendations:  No changes and encouraged to call if experiencing any fluid symptoms.   Follow-up plan: ICM clinic phone appointment on 12/23/2023.   91 day device clinic remote transmission 12/31/2023.     EP/Cardiology Office Visits:   12/09/2023 with Dr Mallipeddi (1st visit).  Recall 08/27/2024 with Dr Nancey.      Copy of ICM check sent to Dr Nancey.   3 month ICM trend: 11/18/2023.    12-14 Month ICM trend:     Loretta GORMAN Garner, RN 11/20/2023 1:14 PM

## 2023-12-09 ENCOUNTER — Ambulatory Visit: Admitting: Internal Medicine

## 2023-12-12 NOTE — Progress Notes (Signed)
 Remote ICD transmission.

## 2023-12-23 ENCOUNTER — Ambulatory Visit: Attending: Cardiovascular Disease

## 2023-12-23 DIAGNOSIS — Z95810 Presence of automatic (implantable) cardiac defibrillator: Secondary | ICD-10-CM

## 2023-12-23 DIAGNOSIS — I5022 Chronic systolic (congestive) heart failure: Secondary | ICD-10-CM | POA: Diagnosis not present

## 2023-12-25 NOTE — Progress Notes (Signed)
 EPIC Encounter for ICM Monitoring  Patient Name: Loretta Thomas is a 73 y.o. female Date: 12/25/2023 Primary Care Physican: Almeda Loa ORN, FNP Primary Cardiologist: Mallipeddi Electrophysiologist: Mealor Bi-V Pacing:   >99%       06/25/2022 Office 187 lbs 05/12/2022 Weight: 170 lbs 06/17/2023 Weight: 171 lbs 07/24/2023 Weight: 169 lbs 08/28/2023 Weight: 166 lbs (she has lost 30 lbs) 10/04/2023 Weight: 163 lbs 11/20/2023 Weight: 159-160 lbs  12/25/2023 Weight: 160 lbs    AT/AF Burden: 0%                                              Spoke with patient and heart failure questions reviewed.  Transmission results reviewed.  Pt asymptomatic for fluid accumulation.  Reports feeling well at this time and voices no complaints.   She was at the beach during decreased impedance.     Diet:  Tries to restrict salt intake.       Corvue thoracic impedance suggesting normal fluid levels with the exception of possible fluid accumulation from 8/11-8/18 and 8/25-9/1.   Prescribed:  Furosemide  40 mg take 1 tablet by mouth every day.  She self adjusts Furosemide  as needed.   Spironolactone  25 mg take 1 tablet (25 mg total) by mouth daily   Labs: 04/23/2023 Creatinine 0.76, BUN 8, Potassium 4.3, Sodium 140, GFR >60 A complete set of results can be found in Results Review.   Recommendations: No changes and encouraged to call if experiencing any fluid symptoms.   Follow-up plan: ICM clinic phone appointment on 02/03/2024.   91 day device clinic remote transmission 12/31/2023.     EP/Cardiology Office Visits:   03/10/2024 with Dr Mallipeddi (1st visit).  Recall 08/27/2024 with Dr Nancey.      Copy of ICM check sent to Dr Nancey.   3 month ICM trend: 12/23/2023.    12-14 Month ICM trend:     Loretta GORMAN Garner, RN 12/25/2023 8:20 AM

## 2023-12-31 ENCOUNTER — Ambulatory Visit (INDEPENDENT_AMBULATORY_CARE_PROVIDER_SITE_OTHER): Payer: Federal, State, Local not specified - PPO

## 2023-12-31 DIAGNOSIS — I5022 Chronic systolic (congestive) heart failure: Secondary | ICD-10-CM

## 2024-01-01 LAB — CUP PACEART REMOTE DEVICE CHECK
Battery Remaining Longevity: 20 mo
Battery Remaining Percentage: 28 %
Battery Voltage: 2.84 V
Brady Statistic AP VP Percent: 1 %
Brady Statistic AP VS Percent: 1 %
Brady Statistic AS VP Percent: 99 %
Brady Statistic AS VS Percent: 1 %
Brady Statistic RA Percent Paced: 1 %
Date Time Interrogation Session: 20250916020016
HighPow Impedance: 71 Ohm
HighPow Impedance: 71 Ohm
Implantable Lead Connection Status: 753985
Implantable Lead Connection Status: 753985
Implantable Lead Connection Status: 753985
Implantable Lead Implant Date: 20190920
Implantable Lead Implant Date: 20190920
Implantable Lead Implant Date: 20190920
Implantable Lead Location: 753858
Implantable Lead Location: 753859
Implantable Lead Location: 753860
Implantable Pulse Generator Implant Date: 20190920
Lead Channel Impedance Value: 380 Ohm
Lead Channel Impedance Value: 540 Ohm
Lead Channel Impedance Value: 910 Ohm
Lead Channel Pacing Threshold Amplitude: 0.5 V
Lead Channel Pacing Threshold Amplitude: 0.75 V
Lead Channel Pacing Threshold Amplitude: 0.875 V
Lead Channel Pacing Threshold Pulse Width: 0.5 ms
Lead Channel Pacing Threshold Pulse Width: 0.5 ms
Lead Channel Pacing Threshold Pulse Width: 0.5 ms
Lead Channel Sensing Intrinsic Amplitude: 11.8 mV
Lead Channel Sensing Intrinsic Amplitude: 3.1 mV
Lead Channel Setting Pacing Amplitude: 2 V
Lead Channel Setting Pacing Amplitude: 2 V
Lead Channel Setting Pacing Amplitude: 2 V
Lead Channel Setting Pacing Pulse Width: 0.5 ms
Lead Channel Setting Pacing Pulse Width: 0.5 ms
Lead Channel Setting Sensing Sensitivity: 0.5 mV
Pulse Gen Serial Number: 9833194
Zone Setting Status: 755011

## 2024-01-03 ENCOUNTER — Other Ambulatory Visit (HOSPITAL_COMMUNITY)
Admission: RE | Admit: 2024-01-03 | Discharge: 2024-01-03 | Disposition: A | Discharge status: Home / Self Care | Source: Ambulatory Visit | Attending: Obstetrics and Gynecology | Admitting: Obstetrics and Gynecology

## 2024-01-03 ENCOUNTER — Ambulatory Visit: Admitting: Obstetrics and Gynecology

## 2024-01-03 ENCOUNTER — Encounter: Payer: Self-pay | Admitting: Obstetrics and Gynecology

## 2024-01-03 VITALS — BP 117/81 | HR 99 | Ht 65.35 in | Wt 162.4 lb

## 2024-01-03 DIAGNOSIS — R82998 Other abnormal findings in urine: Secondary | ICD-10-CM | POA: Diagnosis present

## 2024-01-03 DIAGNOSIS — N393 Stress incontinence (female) (male): Secondary | ICD-10-CM | POA: Diagnosis not present

## 2024-01-03 DIAGNOSIS — N952 Postmenopausal atrophic vaginitis: Secondary | ICD-10-CM

## 2024-01-03 DIAGNOSIS — N3281 Overactive bladder: Secondary | ICD-10-CM | POA: Diagnosis not present

## 2024-01-03 LAB — POCT URINALYSIS DIP (CLINITEK)
Bilirubin, UA: NEGATIVE
Blood, UA: NEGATIVE
Glucose, UA: NEGATIVE mg/dL
Ketones, POC UA: NEGATIVE mg/dL
Nitrite, UA: NEGATIVE
Spec Grav, UA: 1.02 (ref 1.010–1.025)
Urobilinogen, UA: 0.2 U/dL
pH, UA: 6.5 (ref 5.0–8.0)

## 2024-01-03 MED ORDER — ESTRADIOL 0.1 MG/GM VA CREA: TOPICAL_CREAM | VAGINAL | 11 refills | Status: AC

## 2024-01-03 MED ORDER — TROSPIUM CHLORIDE 20 MG PO TABS: 20.0000 mg | ORAL_TABLET | Freq: Two times a day (BID) | ORAL | 5 refills | Status: AC

## 2024-01-03 NOTE — Assessment & Plan Note (Signed)
-   We discussed the symptoms of overactive bladder (OAB), which include urinary urgency, urinary frequency, nocturia, with or without urge incontinence.  While we do not know the exact etiology of OAB, several treatment options exist. We discussed management including behavioral therapy (decreasing bladder irritants, urge suppression strategies, timed voids, bladder retraining), physical therapy, medication. - Prescribed Trospium  20mg  BID. For anticholinergic medications, we discussed the potential side effects of anticholinergics including dry eyes, dry mouth, constipation, cognitive impairment and urinary retention. - Also advised her to drink more water and eliminate coffee and soda.

## 2024-01-03 NOTE — Progress Notes (Signed)
 New Patient Evaluation and Consultation  Referring Provider: Diedre Rosaline BRAVO, MD PCP: Almeda Loa ORN, FNP Date of Service: 01/03/2024  SUBJECTIVE Chief Complaint: New Patient (Initial Visit) Loretta Thomas is a 73 y.o. female here today for urinary incontinence)  History of Present Illness: Loretta Thomas is a 73 y.o. White or Caucasian female seen in consultation at the request of Dr Diedre for evaluation of incontinence.     Urinary Symptoms: Leaks urine with cough/ sneeze, with a full bladder, with urgency, and without sensation Leaks all day- leaks large amount of urine without warning Pad use: 3- 4 pads per day.   Patient is bothered by UI symptoms.  Day time voids 2-4.  Nocturia: 2 times per night to void. Voiding dysfunction:  empties bladder well.  Patient does not use a catheter to empty bladder.  When urinating, patient feels to push on her belly or vagina to empty bladder Drinks: 1 cup coffee, 1-2 cans mt dew, little water per day, occasional coffee in evening  UTIs: 2 UTI's in the last year.  UTIs stopped since she was off the farxiga  Reports history of pyelonephritis   Pelvic Organ Prolapse Symptoms:                  Patient Admits to a feeling of a bulge the vaginal area. It has been present for many years  Patient Admits to seeing a bulge.  This bulge is bothersome. Has not had prior treatment  Bowel Symptom: Bowel movements: 1 time(s) per week Had many years of diarrhea until she started monjuaro.  Stool consistency: hard Straining: yes.  Splinting: no.  Incomplete evacuation: no.  Patient Denies accidental bowel leakage / fecal incontinence Bowel regimen: none  Sexual Function Sexually active: yes, sometimes   Pelvic Pain Denies pelvic pain   Past Medical History:  Past Medical History:  Diagnosis Date   Anxiety    Bladder cystocele    Breast cancer (HCC)    s/p L breast cancer removal   Bundle branch block, left    diagnosed  03/2012 followed by cath showing only minimal CAD   Bursitis    Cancer (HCC)    right breast   Capsulitis    CHF (congestive heart failure) (HCC)    Diverticulosis    Elevated liver enzymes 2007   History of IBS    History of placement of internal cardiac defibrillator    Hyperlipidemia    Hypertension    Nonischemic cardiomyopathy (HCC)    pernicious anemia    Prediabetes      Past Surgical History:   Past Surgical History:  Procedure Laterality Date   ANKLE FRACTURE SURGERY     BIV ICD INSERTION CRT-D N/A 01/03/2018    [facility]. Jude Medical Quadra Assura MP model [phone] (serial  Number [phone]) biventricular ICD for primary prevention of sudden death   BREAST BIOPSY     right breast   BREAST RECONSTRUCTION WITH PLACEMENT OF TISSUE EXPANDER AND FLEX HD (ACELLULAR HYDRATED DERMIS) Right 01/28/2014   Procedure: RIGHT BREAST RECONSTRUCTION WITH PLACEMENT OF TISSUE EXPANDER;  Surgeon: Alm Sick, MD;  Location: [facility] OR;  Service: Plastics;  Laterality: Right;   CARDIAC CATHETERIZATION     x3; 03/18/12 [facility]): 10-20% pLAD, <20% ostial LCX, 20% mPLA, EF 60%.     CHOLECYSTECTOMY     COLONOSCOPY     x4   dental implant     DILATION AND CURETTAGE OF UTERUS  MASTECTOMY Right    malignant   REDUCTION MAMMAPLASTY Left    RHINOPLASTY     SIMPLE MASTECTOMY WITH AXILLARY SENTINEL NODE BIOPSY Right 01/28/2014   Procedure: RIGHT TOTAL MASTECTOMY WITH  RIGHT AXILLARY SENTINEL NODE BIOPSY;  Surgeon: Morene Olives, MD;  Location: [facility] OR;  Service: General;  Laterality: Right;   TONSILLECTOMY AND ADENOIDECTOMY     TUBAL LIGATION       Past OB/GYN History: OB History  Gravida Para Term Preterm AB Living  2 2 2   2   SAB IAB Ectopic Multiple Live Births      2    # Outcome Date GA Lbr Len/2nd Weight Sex Type Anes PTL Lv  2 Term     M Vag-Forceps   LIV  1 Term     M Vag-Forceps   LIV    Menopausal: Yes, Denies vaginal bleeding since menopause Last pap  smear was 2024- negative  Medications: Patient has a current medication list which includes the following prescription(s): acetaminophen , alprazolam , amoxicillin, carvedilol , [START ON 01/06/2024] estradiol , flonase sensimist, furosemide , loratadine, losartan , rosuvastatin , spironolactone , mounjaro, trospium , and venlafaxine  xr.   Allergies: Patient is allergic to codeine, hydrocodone-acetaminophen , crab extract, cucumber extract, demerol [meperidine], hydrocodone, lortab [hydrocodone-acetaminophen ], mangifera indica, other, sacubitril -valsartan , and benzocaine.   Social History:  Social History   Tobacco Use   Smoking status: Never   Smokeless tobacco: Never  Vaping Use   Vaping status: Never Used  Substance Use Topics   Alcohol use: No   Drug use: No    Relationship status: married Patient lives with her spouse.   Patient is not employed- retired. Regular exercise: No History of abuse: No  Family History:   Family History  Problem Relation Age of Onset   Cancer Father 15       mouth   Cancer Sister        skin and colon x2   Cancer Brother        esophageal   Cancer Maternal Grandmother 67       ovarian   Cancer Paternal Grandfather 30       lung   Cancer Maternal Aunt 55       breast   Breast cancer Maternal Aunt 69   Cancer Cousin 12       mat 1st cousin with breast   Breast cancer Cousin 65   Bladder Cancer Neg Hx    Uterine cancer Neg Hx    Renal cancer Neg Hx      Review of Systems: Review of Systems  Constitutional:  Positive for malaise/fatigue. Negative for fever and weight loss.  Respiratory:  Negative for cough, shortness of breath and wheezing.   Cardiovascular:  Negative for chest pain, palpitations and leg swelling.  Gastrointestinal:  Negative for abdominal pain and blood in stool.  Genitourinary:  Negative for dysuria.  Musculoskeletal:  Negative for myalgias.  Skin:  Negative for rash.  Neurological:  Negative for dizziness and headaches.   Endo/Heme/Allergies:  Does not bruise/bleed easily.  Psychiatric/Behavioral:  Positive for depression. The patient is not nervous/anxious.      OBJECTIVE Physical Exam: Vitals:   01/03/24 1034  BP: 117/81  Pulse: 99  Weight: 162 lb 6.4 oz (73.7 kg)  Height: 5' 5.35 (1.66 m)    Physical Exam Vitals reviewed. Exam conducted with a chaperone present.  Constitutional:      General: She is not in acute distress. Pulmonary:     Effort: Pulmonary effort is normal.  Abdominal:     General: There is no distension.     Palpations: Abdomen is soft.     Tenderness: There is no abdominal tenderness. There is no rebound.  Musculoskeletal:        General: No swelling. Normal range of motion.  Skin:    General: Skin is warm and dry.     Findings: No rash.  Neurological:     Mental Status: She is alert and oriented to person, place, and time.  Psychiatric:        Mood and Affect: Mood normal.        Behavior: Behavior normal.      GU / Detailed Urogynecologic Evaluation:  Pelvic Exam: Normal external female genitalia; Bartholin's and Skene's glands normal in appearance; urethral meatus with caruncle, no urethral masses or discharge.   CST: positive  Speculum exam reveals normal vaginal mucosa with atrophy. Cervix normal appearance. Uterus normal single, nontender. Adnexa no mass, fullness, tenderness.     Pelvic floor strength I/V, puborectalis II/V external anal sphincter III/V  Pelvic floor musculature: Right levator non-tender, Right obturator non-tender, Left levator non-tender, Left obturator non-tender  POP-Q:   POP-Q  -3                                            Aa   -3                                           Ba  -8.5                                              C   3                                            Gh  4                                            Pb  10                                            tvl   -2                                             Ap  -2                                            Bp  -9  D      Rectal Exam:  Normal sphincter tone, small distal rectocele, enterocoele not present, no rectal masses, no sign of dyssynergia when asking the patient to bear down.  Post-Void Residual (PVR) by Bladder Scan: In order to evaluate bladder emptying, we discussed obtaining a postvoid residual and patient agreed to this procedure.  Procedure: The ultrasound unit was placed on the patient's abdomen in the suprapubic region after the patient had voided.    Post Void Residual - 01/03/24 1052       Post Void Residual   Post Void Residual 102 mL           Laboratory Results: Lab Results  Component Value Date   COLORU yellow 01/03/2024   CLARITYU clear 01/03/2024   GLUCOSEUR negative 01/03/2024   BILIRUBINUR negative 01/03/2024   SPECGRAV 1.020 01/03/2024   RBCUR negative 01/03/2024   PHUR 6.5 01/03/2024   PROTEINUR NEGATIVE 11/26/2022   UROBILINOGEN 0.2 01/03/2024   LEUKOCYTESUR Small (1+) (A) 01/03/2024    Lab Results  Component Value Date   CREATININE 0.76 04/23/2023   CREATININE 0.88 11/26/2022   CREATININE 0.79 05/07/2022    Lab Results  Component Value Date   HGBA1C 5.5 06/21/2014    Lab Results  Component Value Date   HGB 13.4 11/26/2022     ASSESSMENT AND PLAN Ms. Sestak is a 73 y.o. with:  1. Overactive bladder   2. SUI (stress urinary incontinence, female)   3. Urine leukocytes   4. Vaginal atrophy    No significant prolapse noted on exam today but pt reports having to push up something, although it has been less frequent lately. Advised her to take a picture if possible and can reexamine in the future if needed.   Overactive bladder Assessment & Plan: - We discussed the symptoms of overactive bladder (OAB), which include urinary urgency, urinary frequency, nocturia, with or without urge incontinence.  While we do not know the  exact etiology of OAB, several treatment options exist. We discussed management including behavioral therapy (decreasing bladder irritants, urge suppression strategies, timed voids, bladder retraining), physical therapy, medication. - Prescribed Trospium  20mg  BID. For anticholinergic medications, we discussed the potential side effects of anticholinergics including dry eyes, dry mouth, constipation, cognitive impairment and urinary retention. - Also advised her to drink more water and eliminate coffee and soda.   Orders: -     Trospium  Chloride; Take 1 tablet (20 mg total) by mouth 2 (two) times daily.  Dispense: 60 tablet; Refill: 5  SUI (stress urinary incontinence, female) Assessment & Plan: - For treatment of stress urinary incontinence,  non-surgical options include expectant management, weight loss, physical therapy, as well as a pessary.  Surgical options include a midurethral sling, and transurethral injection of a bulking agent. - Pt is not a great candidate for surgical procedure due to cardiac history. Therefore recommended a pessary or urethral bulking in office. Handout provided to her on urethral bulking.    Urine leukocytes -     POCT URINALYSIS DIP (CLINITEK) -     Urine Culture; Future  Vaginal atrophy Assessment & Plan: - Start estrace  cream vaginally to help with dryness.  - can also use coconut oil or vitamin E as needed.   Orders: -     Estradiol ; Place 0.5g nightly for two weeks then twice a week after  Dispense: 42.5 g; Refill: 11  Return 6 weeks   Rosaline LOISE Caper, MD

## 2024-01-03 NOTE — Assessment & Plan Note (Signed)
-   For treatment of stress urinary incontinence,  non-surgical options include expectant management, weight loss, physical therapy, as well as a pessary.  Surgical options include a midurethral sling, and transurethral injection of a bulking agent. - Pt is not a great candidate for surgical procedure due to cardiac history. Therefore recommended a pessary or urethral bulking in office. Handout provided to her on urethral bulking.

## 2024-01-03 NOTE — Patient Instructions (Addendum)
 Today we talked about ways to manage bladder urgency such as altering your diet to avoid irritative beverages and foods (bladder diet) as well as attempting to decrease stress and other exacerbating factors.    The Most Bothersome Foods* The Least Bothersome Foods*  Coffee - Regular & Decaf Tea - caffeinated Carbonated beverages - cola, non-colas, diet & caffeine-free Alcohols - Beer, Red Wine, White Wine, [address] - Grapefruit, [place], Orange, [employer] - Cranberry, Grapefruit, Orange, Pineapple Vegetables - Tomato & Tomato Products Flavor Enhancers - Hot peppers, Spicy foods, Chili, Horseradish, Vinegar, Monosodium glutamate (MSG) Artificial Sweeteners - NutraSweet, Sweet 'N Low, Equal (sweetener), Saccharin Ethnic foods - [place], [place], [place] food [employer] - low-fat & whole Fruits - Bananas, Blueberries, Honeydew melon, Pears, Raisins, Watermelon Vegetables - Broccoli, [address] Sprouts, [place], Carrots, Cauliflower, [place], Cucumber, Mushrooms, Peas, Radishes, Squash, Zucchini, White potatoes, Sweet potatoes & yams Poultry - Chicken, Eggs, [place], [employer] - Beef, Diplomatic Services operational officer, Lamb Seafood - Shrimp, [place] fish, Salmon Grains - Oat, Rice Snacks - Pretzels, Popcorn  *Mitch ALF et al. Diet and its role in interstitial cystitis/bladder pain syndrome (IC/BPS) and comorbid conditions. [employer]U [employer]. BJU Int. 2012 Jan 11.   We discussed the symptoms of overactive bladder (OAB), which include urinary urgency, urinary frequency, night-time urination, with or without urge incontinence.  We discussed management including behavioral therapy (decreasing bladder irritants by following a bladder diet, urge suppression strategies, timed voids, bladder retraining), physical therapy, medication; and for refractory cases posterior tibial nerve stimulation, sacral neuromodulation, and intravesical botulinum toxin injection.   For anticholinergic medications, we discussed the potential  side effects of anticholinergics including dry eyes, dry mouth, constipation, rare risks of cognitive impairment and urinary retention. You were prescribed Trospium  20mg  twice a day. Let us  know if you have any bothersome side effects.   For treatment of stress urinary incontinence,  non-surgical options include expectant management, weight loss, physical therapy, as well as a pessary.  Surgical options include a midurethral sling, and transurethral injection of a bulking agent.   Constipation: Our goal is to achieve formed bowel movements daily or every-other-day.  You may need to try different combinations of the following options to find what works best for you - everybody's body works differently so feel free to adjust the dosages as needed.  Some options to help maintain bowel health include:  Dietary changes (more leafy greens, vegetables and fruits; less processed foods) Fiber supplementation (Benefiber, FiberCon, Metamucil or Psyllium). Start slow and increase gradually to full dose. Over-the-counter agents such as: stool softeners (Docusate or Colace) and/or laxatives (Miralax, milk of magnesia)  Power Pudding is a natural mixture that may help your constipation.  To make blend 1 cup applesauce, 1 cup wheat bran, and 3/4 cup prune juice, refrigerate and then take 1 tablespoon daily with a large glass of water as needed.  Start vaginal estrogen therapy nightly for two weeks then 2 times weekly at night for treatment of vaginal atrophy (dryness of the vaginal tissues).  Please let us  know if the prescription is too expensive and we can look for alternative options.   Vulvovaginal moisturizer Options: Vitamin E oil (pump or capsule) or cream (Gene's Vit E Cream) Coconut oil Silicone-based lubricant for use during intercourse (wet platinum is a brand available at most drugstores) Crisco Consider the ingredients of the product - the fewer the ingredients the better!  Directions for  Use: Clean and dry your hands Gently dab the vulvar/vaginal area dry as needed Apply  a "pea-sized" amount of the moisturizer onto your fingertip Using you other hand, open the labia  Apply the moisturizer to the vulvar/vaginal tissues Wear loose fitting underwear/clothing if possible following application Use moisturize up to 3 times daily as desired.

## 2024-01-03 NOTE — Assessment & Plan Note (Signed)
-   Start estrace  cream vaginally to help with dryness.  - can also use coconut oil or vitamin E as needed.

## 2024-01-04 ENCOUNTER — Ambulatory Visit: Payer: Self-pay | Admitting: Cardiovascular Disease

## 2024-01-04 LAB — URINE CULTURE: Culture: <10000 — AB

## 2024-01-06 ENCOUNTER — Ambulatory Visit: Payer: Self-pay | Admitting: Obstetrics

## 2024-01-06 NOTE — Progress Notes (Signed)
Remote ICD Transmission.

## 2024-01-11 ENCOUNTER — Other Ambulatory Visit: Payer: Self-pay | Admitting: Hematology and Oncology

## 2024-01-14 NOTE — Telephone Encounter (Signed)
 Pt being seen by our clinic on an as needed basis. Needing to obtain from PCP

## 2024-02-03 ENCOUNTER — Ambulatory Visit: Attending: Cardiovascular Disease

## 2024-02-03 DIAGNOSIS — I5022 Chronic systolic (congestive) heart failure: Secondary | ICD-10-CM

## 2024-02-03 DIAGNOSIS — Z95810 Presence of automatic (implantable) cardiac defibrillator: Secondary | ICD-10-CM | POA: Diagnosis not present

## 2024-02-04 NOTE — Progress Notes (Signed)
 EPIC Encounter for ICM Monitoring  Patient Name: Loretta Thomas is a 73 y.o. female Date: 02/04/2024 Primary Care Physican: Almeda Loa ORN, FNP Primary Cardiologist: Mallipeddi Electrophysiologist: Mealor Bi-V Pacing:   >99%       06/25/2022 Office 187 lbs 05/12/2022 Weight: 170 lbs 06/17/2023 Weight: 171 lbs 07/24/2023 Weight: 169 lbs 08/28/2023 Weight: 166 lbs (she has lost 30 lbs) 10/04/2023 Weight: 163 lbs 11/20/2023 Weight: 159-160 lbs  12/25/2023 Weight: 160 lbs 02/04/2024 Weight: 160 lbs    AT/AF Burden <1%                                              Spoke with patient and heart failure questions reviewed.  Transmission results reviewed.  Pt asymptomatic for fluid accumulation.  Reports feeling well at this time and voices no complaints.   She was out of town during decreased impedance.   Diet:  Tries to restrict salt intake.       Since 12/23/2023 ICM Remote Transmission: [facility]vue thoracic impedance suggesting normal fluid levels with the exception of possible fluid accumulation from 01/22/2024-01/26/2024.   Prescribed:  Furosemide  40 mg take 1 tablet by mouth every day.  She self adjusts Furosemide  as needed.   Spironolactone  25 mg take 1 tablet (25 mg total) by mouth daily   Labs: 04/23/2023 Creatinine 0.76, BUN 8, Potassium 4.3, Sodium 140, GFR >60 A complete set of results can be found in Results Review.   Recommendations:   No changes and encouraged to call if experiencing any fluid symptoms.   Follow-up plan: ICM clinic phone appointment on 03/05/2024.   91 day device clinic remote transmission 03/31/2024.     EP/Cardiology Office Visits:   03/05/2024 with Dr Mallipeddi (1st visit).  Recall 08/27/2024 with Dr Nancey.      Copy of ICM check sent to Dr Nancey.   Remote monitoring is medically necessary for Heart Failure Management.    Daily Thoracic Impedance ICM trend: 11/05/2023 through 02/03/2024.    12-14 Month Thoracic Impedance ICM trend:     [name],  RN 02/04/2024 3:47 PM

## 2024-02-17 ENCOUNTER — Encounter: Payer: Self-pay | Admitting: Obstetrics and Gynecology

## 2024-02-17 ENCOUNTER — Ambulatory Visit: Admitting: Obstetrics and Gynecology

## 2024-02-17 VITALS — BP 114/69 | HR 89

## 2024-02-17 DIAGNOSIS — N393 Stress incontinence (female) (male): Secondary | ICD-10-CM

## 2024-02-17 DIAGNOSIS — N3281 Overactive bladder: Secondary | ICD-10-CM

## 2024-02-17 NOTE — Progress Notes (Signed)
  Urogynecology Return Visit  SUBJECTIVE  History of Present Illness: Loretta Thomas is a 72 y.o. female seen in follow-up for OAB, SUI, Vaginal atrophy. Plan at last visit was consider pessary vs bulking for SUI. Start Trospium  20mg  daily, and do estrogen cream for vaginal atrophy.   Patient reports she is doing the estrogen cream without issues. She states she Trospium  has improved her leakage/urgency by about 40%. States she would prefer to plan for UB over pessary for SUI.      Past Medical History: Patient  has a past medical history of Anxiety, Bladder cystocele, Breast cancer (HCC), Bundle branch block, left, Bursitis, Cancer (HCC), Capsulitis, CHF (congestive heart failure) (HCC), Diverticulosis, Elevated liver enzymes (2007), History of IBS, History of placement of internal cardiac defibrillator, Hyperlipidemia, Hypertension, Nonischemic cardiomyopathy (HCC), pernicious anemia, and Prediabetes.   Past Surgical History: She  has a past surgical history that includes Breast biopsy; Tonsillectomy and adenoidectomy; Rhinoplasty; Ankle fracture surgery; Cholecystectomy; Colonoscopy; Tubal ligation; Dilation and curettage of uterus; Cardiac catheterization; Simple mastectomy with axillary sentinel node biopsy (Right, 01/28/2014); Breast reconstruction with placement of tissue expander and flex hd (acellular hydrated dermis) (Right, 01/28/2014); Mastectomy (Right); BIV ICD INSERTION CRT-D (N/A, 01/03/2018); Reduction mammaplasty (Left); and dental implant.   Medications: She has a current medication list which includes the following prescription(s): acetaminophen , alprazolam , amoxicillin, carvedilol , estradiol , flonase sensimist, furosemide , loratadine, losartan , rosuvastatin , spironolactone , mounjaro, trospium , and venlafaxine  xr.   Allergies: Patient is allergic to codeine, hydrocodone-acetaminophen , crab extract, cucumber extract, demerol [meperidine], hydrocodone, lortab  [hydrocodone-acetaminophen ], mangifera indica, other, sacubitril -valsartan , and benzocaine.   Social History: Patient  reports that she has never smoked. She has never used smokeless tobacco. She reports that she does not drink alcohol and does not use drugs.     OBJECTIVE     Physical Exam: Vitals:   02/17/24 1314  BP: 114/69  Pulse: 89   Gen: No apparent distress, A&O x 3.  Detailed Urogynecologic Evaluation:  Deferred.    ASSESSMENT AND PLAN    Loretta Thomas is a 73 y.o. with:  1. SUI (stress urinary incontinence, female)   2. OAB (overactive bladder)    Patient feels her SUI is more bothersome to her at this point (Leaking with dancing, walking on the beach). She would like to plan for Urethral bulking. Dr. Marilynne had discussed this as a possible option with patient due to medical comorbid conditions making her a poor candidate for a surgical sling. She does not want a pessary as she does not want a foreign object in the vagina.  Patient to continue Trospium  20mg  x2 daily.   Will plan for patient to undergo urethral bulking with Dr. Marilynne after the first of the year.     Loretta Thomas G Harlene Petralia, NP

## 2024-03-05 ENCOUNTER — Ambulatory Visit: Attending: Internal Medicine | Admitting: Internal Medicine

## 2024-03-05 ENCOUNTER — Ambulatory Visit (INDEPENDENT_AMBULATORY_CARE_PROVIDER_SITE_OTHER)

## 2024-03-05 ENCOUNTER — Encounter: Payer: Self-pay | Admitting: Internal Medicine

## 2024-03-05 VITALS — BP 116/70 | HR 83 | Ht 66.0 in | Wt 161.0 lb

## 2024-03-05 DIAGNOSIS — I158 Other secondary hypertension: Secondary | ICD-10-CM

## 2024-03-05 DIAGNOSIS — I5022 Chronic systolic (congestive) heart failure: Secondary | ICD-10-CM

## 2024-03-05 DIAGNOSIS — I5020 Unspecified systolic (congestive) heart failure: Secondary | ICD-10-CM

## 2024-03-05 DIAGNOSIS — Z95810 Presence of automatic (implantable) cardiac defibrillator: Secondary | ICD-10-CM | POA: Diagnosis not present

## 2024-03-05 MED ORDER — FUROSEMIDE 40 MG PO TABS: ORAL_TABLET | ORAL | Status: DC

## 2024-03-05 MED ORDER — SPIRONOLACTONE 25 MG PO TABS: 12.5000 mg | ORAL_TABLET | Freq: Every day | ORAL | Status: DC

## 2024-03-05 NOTE — Progress Notes (Signed)
 EPIC Encounter for ICM Monitoring  Patient Name: Loretta Thomas is a 73 y.o. female Date: 03/05/2024 Primary Care Physican: Almeda Loa ORN, FNP Primary Cardiologist: Mallipeddi Electrophysiologist: Mealor Bi-V Pacing:   >99%       06/25/2022 Office 187 lbs 05/12/2022 Weight: 170 lbs 06/17/2023 Weight: 171 lbs 07/24/2023 Weight: 169 lbs 08/28/2023 Weight: 166 lbs (she has lost 30 lbs) 10/04/2023 Weight: 163 lbs 11/20/2023 Weight: 159-160 lbs  12/25/2023 Weight: 160 lbs 02/04/2024 Weight: 160 lbs    AT/AF Burden <1%                                              Spoke with patient and heart failure questions reviewed.  Transmission results reviewed.  Pt asymptomatic for fluid accumulation.  Reports feeling well at this time and voices no complaints.  She stated she was on a bus trip yesterday that may have caused the start of fluid accumulation.    Diet:  Tries to restrict salt intake.       Since 02/03/2024 ICM Remote Transmission: [facility] thoracic impedance suggesting possible fluid accumulation starting 03/04/2024.  Frequent fluctuations alternating between possible dryness and possible fluid accumulation.   Prescribed:  Furosemide  40 mg take 1 tablet by mouth every day.  She self adjusts Furosemide  as needed.   Spironolactone  25 mg take 1 tablet (25 mg total) by mouth daily   Labs: 04/23/2023 Creatinine 0.76, BUN 8, Potassium 4.3, Sodium 140, GFR >60 A complete set of results can be found in Results Review.   Recommendations:  No changes.   Copy sent to Dr Mallidpeddi for review at 03/05/2024 office visit.    Follow-up plan: ICM clinic phone appointment on 04/13/2024.   91 day device clinic remote transmission 03/31/2024.     EP/Cardiology Office Visits:   03/05/2024 with Dr Mallipeddi (1st visit).  Recall 08/27/2024 with Dr Nancey.      Copy of ICM check sent to Dr Nancey.    Remote monitoring is medically necessary for Heart Failure Management.    Daily Thoracic Impedance ICM  trend: 12/06/2023 through 03/05/2024.    12-14 Month Thoracic Impedance ICM trend:     Loretta GORMAN Garner, RN 03/05/2024 7:41 AM

## 2024-03-05 NOTE — Progress Notes (Signed)
 Cardiology Office Note  Date: 03/05/2024   ID: Loretta Thomas, Philipp April 22, 1950, MRN [id number]  PCP:  Almeda Loa ORN, FNP  Cardiologist:  None Electrophysiologist:  Eulas FORBES Furbish, MD   History of Present Illness: Loretta Thomas is a 73 y.o. female  Referred to cardiology clinic to establish care.  She was previously followed by advanced heart failure and electrophysiology.  Notes reviewed.  She has NICM with multiple heart caths with no CAD, last LHC in 2013.  She initially had LVEF 15% in 2019 that was felt to be due to LBBB.  She underwent [employer] CRT-replacement in 2019 after which the LVEF improved to 25% in 2021 and 50 to 55% in 2024.  Currently on GDMT.  Compliant.  Has no issues.  Denies having any angina or DOE.  No dizziness, palpitations, presyncope, syncope, leg swelling.  Past Medical History:  Diagnosis Date   Anxiety    Bladder cystocele    Breast cancer (HCC)    s/p L breast cancer removal   Bundle branch block, left    diagnosed 03/2012 followed by cath showing only minimal CAD   Bursitis    Cancer (HCC)    right breast   Capsulitis    CHF (congestive heart failure) (HCC)    Diverticulosis    Elevated liver enzymes 2007   History of IBS    History of placement of internal cardiac defibrillator    Hyperlipidemia    Hypertension    Nonischemic cardiomyopathy (HCC)    pernicious anemia    Prediabetes     Past Surgical History:  Procedure Laterality Date   ANKLE FRACTURE SURGERY     BIV ICD INSERTION CRT-D N/A 01/03/2018    [facility]. [employer] [place] [employer] MP model [id number] (serial  Number [id number]) biventricular ICD for primary prevention of sudden death   BREAST BIOPSY     right breast   BREAST RECONSTRUCTION WITH PLACEMENT OF TISSUE EXPANDER AND FLEX HD (ACELLULAR HYDRATED DERMIS) Right 01/28/2014   Procedure: RIGHT BREAST RECONSTRUCTION WITH PLACEMENT OF TISSUE EXPANDER;  Surgeon: Alm Sick, MD;  Location: [facility] OR;  Service: Plastics;   Laterality: Right;   CARDIAC CATHETERIZATION     x3; 03/18/12 [facility] [place]): 10-20% pLAD, <20% ostial LCX, 20% mPLA, EF 60%.     CHOLECYSTECTOMY     COLONOSCOPY     x4   dental implant     DILATION AND CURETTAGE OF UTERUS     MASTECTOMY Right    malignant   REDUCTION MAMMAPLASTY Left    RHINOPLASTY     SIMPLE MASTECTOMY WITH AXILLARY SENTINEL NODE BIOPSY Right 01/28/2014   Procedure: RIGHT TOTAL MASTECTOMY WITH  RIGHT AXILLARY SENTINEL NODE BIOPSY;  Surgeon: Morene Olives, MD;  Location: [facility] OR;  Service: General;  Laterality: Right;   TONSILLECTOMY AND ADENOIDECTOMY     TUBAL LIGATION      Current Outpatient Medications  Medication Sig Dispense Refill   acetaminophen  (TYLENOL ) 500 MG tablet Take 500 mg by mouth every 6 (six) hours as needed.      ALPRAZolam  (XANAX ) 0.25 MG tablet Take 1 tablet (0.25 mg total) by mouth 2 (two) times daily as needed for anxiety. 30 tablet 0   amoxicillin (AMOXIL) 500 MG capsule Take 500 mg by mouth as needed. Prior to dental appt     carvedilol  (COREG ) 3.125 MG tablet Take 1 tablet (3.125 mg total) by mouth 2 (two) times daily with a meal. 180 tablet 3  estradiol  (ESTRACE ) 0.1 MG/GM vaginal cream Place 0.5g nightly for two weeks then twice a week after 42.5 g 11   fluticasone (FLONASE SENSIMIST) 27.5 MCG/SPRAY nasal spray 27.5 sprays as needed.     furosemide  (LASIX ) 40 MG tablet Take 1 tablet (40 mg total) by mouth daily. 30 tablet 5   loratadine (CLARITIN) 10 MG tablet Take 10 mg by mouth as needed for allergies.     losartan  (COZAAR ) 100 MG tablet Take 1 tablet (100 mg total) by mouth daily. 90 tablet 3   rosuvastatin  (CRESTOR ) 5 MG tablet Take 1 tablet (5 mg total) by mouth daily. 90 tablet 3   spironolactone  (ALDACTONE ) 25 MG tablet Take 1 tablet (25 mg total) by mouth daily. 90 tablet 3   tirzepatide (MOUNJARO) 7.5 MG/0.5ML Pen Inject 7.5 mg into the skin once a week.     trospium  (SANCTURA ) 20 MG tablet Take 1 tablet (20 mg  total) by mouth 2 (two) times daily. 60 tablet 5   venlafaxine  XR (EFFEXOR -XR) 75 MG 24 hr capsule Take 1 capsule (75 mg total) by mouth daily with breakfast. 90 capsule 3   No current facility-administered medications for this visit.   Allergies:  Codeine, Hydrocodone-acetaminophen , Crab extract, Cucumber extract, Demerol [meperidine], Hydrocodone, Lortab [hydrocodone-acetaminophen ], Mangifera indica, Other, Sacubitril -valsartan , and Benzocaine   Social History: The patient  reports that she has never smoked. She has never used smokeless tobacco. She reports that she does not drink alcohol and does not use drugs.   Family History: The patient's family history includes Breast cancer (age of onset: 63) in her cousin; Breast cancer (age of onset: 30) in her maternal aunt; Cancer in her brother and sister; Cancer (age of onset: 50) in her cousin; Cancer (age of onset: 33) in her maternal aunt; Cancer (age of onset: 58) in her maternal grandmother; Cancer (age of onset: 35) in her paternal grandfather; Cancer (age of onset: 38) in her father.   ROS:  Please see the history of present illness. Otherwise, complete review of systems is positive for none  All other systems are reviewed and negative.   Physical Exam: VS:  BP 116/70 (BP Location: Left Arm, Cuff Size: Normal)   Pulse 83   Ht 5' 6 (1.676 m)   Wt 161 lb (73 kg)   SpO2 95%   BMI 25.99 kg/m , BMI Body mass index is 25.99 kg/m.  Wt Readings from Last 3 Encounters:  03/05/24 161 lb (73 kg)  01/03/24 162 lb 6.4 oz (73.7 kg)  09/02/23 166 lb (75.3 kg)    General: Patient appears comfortable at rest. HEENT: Conjunctiva and lids normal, oropharynx clear with moist mucosa. Neck: Supple, no elevated JVP or carotid bruits, no thyromegaly. Lungs: Clear to auscultation, nonlabored breathing at rest. Cardiac: Regular rate and rhythm, no S3 or significant systolic murmur, no pericardial rub. Abdomen: Soft, nontender, no hepatomegaly, bowel  sounds present, no guarding or rebound. Extremities: No pitting edema, distal pulses 2+. Skin: Warm and dry. Musculoskeletal: No kyphosis. Neuropsychiatric: Alert and oriented x3, affect grossly appropriate.  Recent Labwork: 04/23/2023: B Natriuretic Peptide 24.5; BUN 8; Creatinine, Ser 0.76; Potassium 4.3; Sodium 140     Component Value Date/Time   CHOL 151 06/21/2021 1143   TRIG 140 06/21/2021 1143   HDL 42 06/21/2021 1143   CHOLHDL 3.6 06/21/2021 1143   VLDL 28 06/21/2021 1143   LDLCALC 81 06/21/2021 1143    Assessment and Plan:  HFimpEF - NICM, LVEF 15% in 2019 that was  felt to be from LBBB.  LVEF improved to 25% in 2021 and 50 to 55% in May 2024, 50% in May 2025. - Currently taking p.o. Lasix  40 mg daily alternating with 20 mg. - Continue carvedilol  3.125 mg twice daily.  Did not tolerate increased doses due to fatigue. - Continue losartan  100 mg once daily.  Did not tolerate Entresto , had rash. - Continue spironolactone  12.5 mg once daily. - Off Farxiga  due to recurrent UTIs. - PYP scan was equivoval and based on echocardiogram findings (completely normal RV), advanced heart failure team did not think she had cardiac amyloidosis, or not severe enough to cause LV dysfunction.  Myeloma panel negative.  Joint test negative.  LBBB s/p CRT-D - Follows with electrophysiology.  History of breast cancer treated with surgery and letrozole  (no chemo or radiation)     Medication Adjustments/Labs and Tests Ordered: Current medicines are reviewed at length with the patient today.  Concerns regarding medicines are outlined above.    Disposition:  Follow up 6 months  Signed Melva Faux Priya Suki Crockett, MD, 03/05/2024 2:30 PM    [facility], [facility] [address], [place], [place] [address]

## 2024-03-05 NOTE — Patient Instructions (Signed)
 Medication Instructions:  Your physician recommends that you continue on your current medications as directed. Please refer to the Current Medication list given to you today.  *If you need a refill on your cardiac medications before your next appointment, please call your pharmacy*  Lab Work: None If you have labs (blood work) drawn today and your tests are completely normal, you will receive your results only by: MyChart Message (if you have MyChart) OR A paper copy in the mail If you have any lab test that is abnormal or we need to change your treatment, we will call you to review the results.  Testing/Procedures: None  Follow-Up: At [facility], you and your health needs are our priority.  As part of our continuing mission to provide you with exceptional heart care, our providers are all part of one team.  This team includes your primary Cardiologist (physician) and Advanced Practice Providers or APPs (Physician Assistants and Nurse Practitioners) who all work together to provide you with the care you need, when you need it.  Your next appointment:   6 month(s)  Provider:   You may see Vishnu Mallipeddi, MD or the following Advanced Practice Provider on your designated Care Team:   Almarie Crate, NP    We recommend signing up for the patient portal called MyChart.  Sign up information is provided on this After Visit Summary.  MyChart is used to connect with patients for Virtual Visits (Telemedicine).  Patients are able to view lab/test results, encounter notes, upcoming appointments, etc.  Non-urgent messages can be sent to your provider as well.   To learn more about what you can do with MyChart, go to [email or web address].   Other Instructions

## 2024-03-09 DIAGNOSIS — I5020 Unspecified systolic (congestive) heart failure: Secondary | ICD-10-CM | POA: Insufficient documentation

## 2024-03-10 ENCOUNTER — Ambulatory Visit: Admitting: Internal Medicine

## 2024-03-16 ENCOUNTER — Telehealth: Payer: Self-pay | Admitting: Internal Medicine

## 2024-03-16 ENCOUNTER — Encounter: Payer: Self-pay | Admitting: Student

## 2024-03-16 NOTE — Telephone Encounter (Signed)
 Pt is needing letter to Dentist that she does not need antibiotics anymore before dental appointments. Please fax to Dr Cordella Collet [phone]

## 2024-03-17 NOTE — Telephone Encounter (Signed)
 Letter faxed to provider.

## 2024-03-30 ENCOUNTER — Telehealth: Payer: Self-pay

## 2024-03-30 NOTE — Telephone Encounter (Signed)
 Received voice mail message from patient.  She reported her heart was thumping on the night of 03/26/2024 and she had an episode on 03/28/2024.  She would like to have a remote transmission reviewed to check if she is having any dehydration that may have caused the episodes this past week.

## 2024-03-30 NOTE — Telephone Encounter (Signed)
 Spoke with patient.  She started she had 2 episodes in the last 5 days.  She experienced heart felt irregular and was thumping while laying on left side on 03/26/2024.  On 03/28/2024 she was generally not feeling well and mouth was very dry.  She increased fluid intake and felt better in about an hour.  She feels okay today but generally not feeling her best.  She was not at home at time of the call and could not send a remote transmission for review today.  Advised she has an automatic transmission scheduled for tomorrow 03/31/2024 and she said it was fine to wait for a review of that report.  Advised will call with the results on 04/10/2024.

## 2024-03-31 ENCOUNTER — Ambulatory Visit: Payer: Federal, State, Local not specified - PPO

## 2024-03-31 ENCOUNTER — Telehealth: Payer: Self-pay

## 2024-03-31 ENCOUNTER — Ambulatory Visit: Attending: Cardiovascular Disease

## 2024-03-31 DIAGNOSIS — Z95810 Presence of automatic (implantable) cardiac defibrillator: Secondary | ICD-10-CM

## 2024-03-31 DIAGNOSIS — I5022 Chronic systolic (congestive) heart failure: Secondary | ICD-10-CM

## 2024-03-31 NOTE — Progress Notes (Signed)
 EPIC Encounter for ICM Monitoring  Patient Name: Loretta Thomas is a 73 y.o. female Date: 03/31/2024 Primary Care Physican: Almeda Loa ORN, FNP Primary Cardiologist: Mallipeddi Electrophysiologist: Mealor Bi-V Pacing:   >99%       06/25/2022 Office 187 lbs 05/12/2022 Weight: 170 lbs 06/17/2023 Weight: 171 lbs 07/24/2023 Weight: 169 lbs 08/28/2023 Weight: 166 lbs (she has lost 30 lbs) 10/04/2023 Weight: 163 lbs 11/20/2023 Weight: 159-160 lbs  12/25/2023 Weight: 160 lbs 02/04/2024 Weight: 160 lbs 03/31/2024 Weight: 160 lbs    AT/AF Burden <1%                                              Spoke with patient and heart failure questions reviewed.  Transmission results reviewed.  Pt reports she had a UTI about a month ago.  She also had 2 episodes in the last week, on 03/26/2024 & 03/28/2024. She felt her heart thumping and thought it may be irregular as well.  She was laying on left side on 03/26/2024 when thumping occurred.  On 03/28/2024 she was generally not feeling well and mouth was very dry. She increased fluid intake and felt better in about an hour.    Diet:  Tries to restrict salt intake.       Since 03/05/2024 ICM Remote Transmission: Corvue thoracic impedance suggesting possible fluid accumulation from 03/26/2024-03/30/2024 and 03/04/2024-03/09/2024.  Frequent fluctuations alternating between possible dryness and possible fluid accumulation.  No alerts or episodes noted on 03/31/2024 report but sent message to device clinic triage for review.     Prescribed:  Furosemide  40 mg take 1 tablet by mouth every other day alternating with 0.5 tablet (20 mg total) every other day.  She self adjusts Furosemide  as needed.   Spironolactone  25 mg take 1 tablet (25 mg total) by mouth daily   Labs: 04/23/2023 Creatinine 0.76, BUN 8, Potassium 4.3, Sodium 140, GFR >60 A complete set of results can be found in Results Review.   Recommendations:  No changes and encouraged to call if experiencing any  fluid symptoms.    Follow-up plan: ICM clinic phone appointment on 04/13/2024.   91 day device clinic remote transmission 06/30/2024.     EP/Cardiology Office Visits:   03/05/2024 with Dr Mallipeddi (1st visit).  Recall 08/27/2024 with Dr Nancey.      Copy of ICM check sent to Dr Nancey.    Remote monitoring is medically necessary for Heart Failure Management.    Daily Thoracic Impedance ICM trend: 01/01/2024 through 03/31/2024.    12-14 Month Thoracic Impedance ICM trend:     Loretta GORMAN Garner, RN 03/31/2024 10:15 AM

## 2024-03-31 NOTE — Telephone Encounter (Signed)
-----   Message from Nurse Mitzie RAMAN, RN sent at 03/31/2024 10:24 AM EST ----- Regarding: Pt sx Would you please review 12/16 Merlin report.  Pt c/o 2 episodes in the last week, on 03/26/2024 & 03/28/2024. She felt her heart thumping amd thought it may be irregular as well.  She was laying on left side on 03/26/2024 when thumping occurred.  On 03/28/2024 she was generally not feeling well and mouth was very dry. She increased fluid intake and felt better in about an hour.   She did have a little fluid during that   There were no alerts or episodes on report but wanted to make sure I didn't miss something.   Thanks Mitzie

## 2024-03-31 NOTE — Telephone Encounter (Signed)
 Transmission Reviewed: (nothing correlates with symptom events).  Normal Device function No episodes/ No alerts Lead trends normal/stable.  This is a CRT-D device with 3rd, LV lead.  Question whether thumping (esp when lying on left side) could be diaphragmatic stim from her device.    LM for patient to call back and discuss.

## 2024-03-31 NOTE — Telephone Encounter (Signed)
 Patient returned RN's call.

## 2024-03-31 NOTE — Progress Notes (Signed)
 Prentiss Almarie DASEN, RN  Carnita Golob, Mitzie RAMAN, RN All looks well on report. If she felt something like a hiccup that would be the LV wire on her phrenic nerve (not dangerous) but can cause jumping or thumping on her left side. That would also not be in her chest or irregular. But otherwise, all looks good from our end.  Thanks! Leigh

## 2024-04-01 LAB — CUP PACEART REMOTE DEVICE CHECK
Battery Remaining Longevity: 19 mo
Battery Remaining Percentage: 27 %
Battery Voltage: 2.83 V
Brady Statistic AP VP Percent: 1 %
Brady Statistic AP VS Percent: 1 %
Brady Statistic AS VP Percent: 99 %
Brady Statistic AS VS Percent: 1 %
Brady Statistic RA Percent Paced: 1 %
Date Time Interrogation Session: 20251216030703
HighPow Impedance: 74 Ohm
HighPow Impedance: 74 Ohm
Implantable Lead Connection Status: 753985
Implantable Lead Connection Status: 753985
Implantable Lead Connection Status: 753985
Implantable Lead Implant Date: 20190920
Implantable Lead Implant Date: 20190920
Implantable Lead Implant Date: 20190920
Implantable Lead Location: 753858
Implantable Lead Location: 753859
Implantable Lead Location: 753860
Implantable Pulse Generator Implant Date: 20190920
Lead Channel Impedance Value: 390 Ohm
Lead Channel Impedance Value: 550 Ohm
Lead Channel Impedance Value: 890 Ohm
Lead Channel Pacing Threshold Amplitude: 0.5 V
Lead Channel Pacing Threshold Amplitude: 0.875 V
Lead Channel Pacing Threshold Amplitude: 0.875 V
Lead Channel Pacing Threshold Pulse Width: 0.5 ms
Lead Channel Pacing Threshold Pulse Width: 0.5 ms
Lead Channel Pacing Threshold Pulse Width: 0.5 ms
Lead Channel Sensing Intrinsic Amplitude: 11.8 mV
Lead Channel Sensing Intrinsic Amplitude: 3.3 mV
Lead Channel Setting Pacing Amplitude: 2 V
Lead Channel Setting Pacing Amplitude: 2 V
Lead Channel Setting Pacing Amplitude: 2 V
Lead Channel Setting Pacing Pulse Width: 0.5 ms
Lead Channel Setting Pacing Pulse Width: 0.5 ms
Lead Channel Setting Sensing Sensitivity: 0.5 mV
Pulse Gen Serial Number: 9833194
Zone Setting Status: 755011

## 2024-04-01 NOTE — Telephone Encounter (Signed)
 Called and spoke w/ patient regarding c/o not feeling well and thumping sensation. Patient states her eye was causing her pain and generally did not feel good last weekend on 12/12 & 12/13. When she laid down on her left side she noted a thumping sensation that was brief and went away.   Dr Nancey noted in OV w/ patient on 09/02/2023 Possible intermittent stim effect from LV lead. Will continue to monitor .   Patient states she has scheduled an appointment w/ both her eye doctor and PCP. Patient informed she has 3 leads attached to her device and thumping sensation could be d/t one of the leads causing intermittent stim. Advised to keep F/U appointments w/ eye doctor and PCP for eye issues and generally not feeling well.   Patient states thumping sensation is brief and went away when changing positions. Patient wants to F/U w/ PCP first and if she continues to notice anymore thumping or becomes bothersome she will call device clinic. Patient verbalized understanding and device clinic number given.   Will continue to monitor and update accordingly.

## 2024-04-01 NOTE — Progress Notes (Signed)
 Remote ICD Transmission

## 2024-04-13 ENCOUNTER — Ambulatory Visit: Attending: Cardiovascular Disease

## 2024-04-13 ENCOUNTER — Other Ambulatory Visit: Payer: Self-pay | Admitting: Family Medicine

## 2024-04-13 ENCOUNTER — Encounter: Payer: Self-pay | Admitting: Family Medicine

## 2024-04-13 ENCOUNTER — Other Ambulatory Visit (HOSPITAL_COMMUNITY): Payer: Self-pay | Admitting: Internal Medicine

## 2024-04-13 DIAGNOSIS — I5022 Chronic systolic (congestive) heart failure: Secondary | ICD-10-CM

## 2024-04-13 DIAGNOSIS — Z1231 Encounter for screening mammogram for malignant neoplasm of breast: Secondary | ICD-10-CM

## 2024-04-13 DIAGNOSIS — Z95810 Presence of automatic (implantable) cardiac defibrillator: Secondary | ICD-10-CM | POA: Diagnosis not present

## 2024-04-14 NOTE — Progress Notes (Signed)
 EPIC Encounter for ICM Monitoring  Patient Name: Loretta Thomas is a 73 y.o. female Date: 04/14/2024 Primary Care Physican: Almeda Loa ORN, FNP Primary Cardiologist: Mallipeddi Electrophysiologist: Mealor Bi-V Pacing:   >99%       06/25/2022 Office 187 lbs 05/12/2022 Weight: 170 lbs 06/17/2023 Weight: 171 lbs 07/24/2023 Weight: 169 lbs 08/28/2023 Weight: 166 lbs (she has lost 30 lbs) 10/04/2023 Weight: 163 lbs 11/20/2023 Weight: 159-160 lbs  12/25/2023 Weight: 160 lbs 02/04/2024 Weight: 160 lbs 03/31/2024 Weight: 160 lbs  04/14/2024 Weight: 155 lbs (lost 45 pounds on wt loss drug)    AT/AF Burden <1%                                              Spoke with patient and heart failure questions reviewed.  Transmission results reviewed.  Pt reports she has not felt well the next week.     Diet:  Tries to restrict salt intake.       Since 03/05/2024 ICM Remote Transmission: Corvue thoracic impedance suggesting possible dryness starting 04/06/2024 and trending back toward baseline.     Prescribed:  Furosemide  40 mg take 1 tablet by mouth every other day alternating with 0.5 tablet (20 mg total) every other day.  She self adjusts Furosemide  as needed.   Spironolactone  25 mg take 1 tablet (25 mg total) by mouth daily   Labs: 04/23/2023 Creatinine 0.76, BUN 8, Potassium 4.3, Sodium 140, GFR >60 A complete set of results can be found in Results Review.   Recommendations:  No changes and encouraged to call if experiencing any fluid symptoms.  Encouraged to drink at least 64 oz daily to stay hydrated.    Follow-up plan: ICM clinic phone appointment on 05/18/2024.   91 day device clinic remote transmission 06/30/2024.     EP/Cardiology Office Visits:   Recall 09/02/2024 with Dr Mallipeddi (1st visit).  Recall 08/27/2024 with Dr Nancey.      Copy of ICM check sent to Dr Nancey.    Remote monitoring is medically necessary for Heart Failure Management.    Daily Thoracic Impedance ICM trend: 01/13/2024  through 04/13/2024.    12-14 Month Thoracic Impedance ICM trend:     [name], RN 04/14/2024 4:04 PM

## 2024-04-15 ENCOUNTER — Ambulatory Visit: Payer: Self-pay | Admitting: Cardiovascular Disease

## 2024-04-23 ENCOUNTER — Telehealth: Payer: Self-pay

## 2024-04-23 NOTE — Telephone Encounter (Signed)
 Patient had two UTI's since last visit and yesterday she had blood in her urine. She wanted you to be aware she has had breast cancer before she had bulking done.

## 2024-04-28 NOTE — Telephone Encounter (Signed)
 Patient went to urgent care and was given an antibiotic. Patients results are in her chart.

## 2024-05-08 ENCOUNTER — Other Ambulatory Visit (HOSPITAL_COMMUNITY): Payer: Self-pay | Admitting: Internal Medicine

## 2024-05-08 DIAGNOSIS — I5022 Chronic systolic (congestive) heart failure: Secondary | ICD-10-CM

## 2024-05-14 ENCOUNTER — Other Ambulatory Visit (HOSPITAL_COMMUNITY): Payer: Self-pay | Admitting: Internal Medicine

## 2024-05-14 NOTE — Progress Notes (Signed)
 31 day ICM Remote transmission canceled due to [facility] clinic is on hold until further notice.  91 day remote monitoring will continue per protocol.

## 2024-05-18 ENCOUNTER — Ambulatory Visit

## 2024-05-19 ENCOUNTER — Telehealth: Payer: Self-pay

## 2024-06-25 ENCOUNTER — Ambulatory Visit: Admitting: Obstetrics and Gynecology

## 2024-08-03 ENCOUNTER — Ambulatory Visit
